# Patient Record
Sex: Female | Born: 1965 | Race: White | Hispanic: No | Marital: Single | Smoking: Never smoker
Health system: Southern US, Community
[De-identification: ages and names within clinical notes are randomized; demographics above are authoritative.]

## PROBLEM LIST (undated history)

## (undated) DIAGNOSIS — I8290 Acute embolism and thrombosis of unspecified vein: Secondary | ICD-10-CM

## (undated) HISTORY — PX: ABDOMINAL HYSTERECTOMY: SHX81

---

## 1997-09-17 ENCOUNTER — Encounter: Admission: RE | Admit: 1997-09-17 | Discharge: 1997-09-17 | Payer: Self-pay | Admitting: *Deleted

## 1997-11-18 ENCOUNTER — Ambulatory Visit (HOSPITAL_COMMUNITY): Admission: RE | Admit: 1997-11-18 | Discharge: 1997-11-18 | Payer: Self-pay | Admitting: Obstetrics and Gynecology

## 1997-11-18 ENCOUNTER — Encounter: Payer: Self-pay | Admitting: Obstetrics and Gynecology

## 1997-12-09 ENCOUNTER — Emergency Department (HOSPITAL_COMMUNITY): Admission: EM | Admit: 1997-12-09 | Discharge: 1997-12-09 | Payer: Self-pay | Admitting: Emergency Medicine

## 1998-01-24 ENCOUNTER — Emergency Department (HOSPITAL_COMMUNITY): Admission: EM | Admit: 1998-01-24 | Discharge: 1998-01-24 | Payer: Self-pay | Admitting: Emergency Medicine

## 1998-01-24 ENCOUNTER — Encounter: Payer: Self-pay | Admitting: Emergency Medicine

## 1998-01-28 ENCOUNTER — Ambulatory Visit (HOSPITAL_COMMUNITY): Admission: RE | Admit: 1998-01-28 | Discharge: 1998-01-28 | Payer: Self-pay | Admitting: Family Medicine

## 1998-11-06 ENCOUNTER — Emergency Department (HOSPITAL_COMMUNITY): Admission: EM | Admit: 1998-11-06 | Discharge: 1998-11-06 | Payer: Self-pay | Admitting: Emergency Medicine

## 1998-11-06 ENCOUNTER — Encounter: Payer: Self-pay | Admitting: Emergency Medicine

## 2000-06-23 ENCOUNTER — Ambulatory Visit (HOSPITAL_COMMUNITY): Admission: RE | Admit: 2000-06-23 | Discharge: 2000-06-23 | Payer: Self-pay | Admitting: Family Medicine

## 2000-06-23 ENCOUNTER — Encounter: Admission: RE | Admit: 2000-06-23 | Discharge: 2000-06-23 | Payer: Self-pay | Admitting: Family Medicine

## 2000-07-07 ENCOUNTER — Encounter: Admission: RE | Admit: 2000-07-07 | Discharge: 2000-07-07 | Payer: Self-pay | Admitting: Family Medicine

## 2000-09-12 ENCOUNTER — Ambulatory Visit (HOSPITAL_COMMUNITY): Admission: RE | Admit: 2000-09-12 | Discharge: 2000-09-12 | Payer: Self-pay | Admitting: Family Medicine

## 2000-09-12 ENCOUNTER — Encounter: Payer: Self-pay | Admitting: Family Medicine

## 2001-06-10 ENCOUNTER — Encounter: Payer: Self-pay | Admitting: Emergency Medicine

## 2001-06-10 ENCOUNTER — Emergency Department (HOSPITAL_COMMUNITY): Admission: EM | Admit: 2001-06-10 | Discharge: 2001-06-10 | Payer: Self-pay | Admitting: Emergency Medicine

## 2002-10-01 ENCOUNTER — Encounter: Admission: RE | Admit: 2002-10-01 | Discharge: 2002-10-01 | Payer: Self-pay | Admitting: Internal Medicine

## 2002-10-10 ENCOUNTER — Encounter: Admission: RE | Admit: 2002-10-10 | Discharge: 2002-10-10 | Payer: Self-pay | Admitting: Internal Medicine

## 2002-10-30 ENCOUNTER — Encounter: Admission: RE | Admit: 2002-10-30 | Discharge: 2002-10-30 | Payer: Self-pay | Admitting: Internal Medicine

## 2002-10-30 ENCOUNTER — Encounter: Payer: Self-pay | Admitting: Internal Medicine

## 2002-10-30 ENCOUNTER — Ambulatory Visit (HOSPITAL_COMMUNITY): Admission: RE | Admit: 2002-10-30 | Discharge: 2002-10-30 | Payer: Self-pay | Admitting: Internal Medicine

## 2002-12-05 ENCOUNTER — Emergency Department (HOSPITAL_COMMUNITY): Admission: EM | Admit: 2002-12-05 | Discharge: 2002-12-06 | Payer: Self-pay | Admitting: *Deleted

## 2002-12-07 ENCOUNTER — Encounter: Admission: RE | Admit: 2002-12-07 | Discharge: 2002-12-07 | Payer: Self-pay | Admitting: Internal Medicine

## 2002-12-19 ENCOUNTER — Encounter: Admission: RE | Admit: 2002-12-19 | Discharge: 2002-12-19 | Payer: Self-pay | Admitting: Internal Medicine

## 2003-01-26 ENCOUNTER — Emergency Department (HOSPITAL_COMMUNITY): Admission: EM | Admit: 2003-01-26 | Discharge: 2003-01-27 | Payer: Self-pay | Admitting: Emergency Medicine

## 2003-02-21 ENCOUNTER — Encounter: Admission: RE | Admit: 2003-02-21 | Discharge: 2003-02-21 | Payer: Self-pay | Admitting: Internal Medicine

## 2003-08-21 ENCOUNTER — Emergency Department (HOSPITAL_COMMUNITY): Admission: EM | Admit: 2003-08-21 | Discharge: 2003-08-21 | Payer: Self-pay | Admitting: Family Medicine

## 2010-09-24 ENCOUNTER — Emergency Department (HOSPITAL_COMMUNITY)
Admission: EM | Admit: 2010-09-24 | Discharge: 2010-09-24 | Disposition: A | Discharge status: Home / Self Care | Payer: No Typology Code available for payment source | Attending: Emergency Medicine | Admitting: Emergency Medicine

## 2010-09-24 ENCOUNTER — Emergency Department (HOSPITAL_COMMUNITY): Payer: No Typology Code available for payment source

## 2010-09-24 DIAGNOSIS — Y92410 Unspecified street and highway as the place of occurrence of the external cause: Secondary | ICD-10-CM | POA: Insufficient documentation

## 2010-09-24 DIAGNOSIS — T07XXXA Unspecified multiple injuries, initial encounter: Secondary | ICD-10-CM | POA: Insufficient documentation

## 2010-09-24 DIAGNOSIS — M542 Cervicalgia: Secondary | ICD-10-CM | POA: Insufficient documentation

## 2010-09-24 DIAGNOSIS — M549 Dorsalgia, unspecified: Secondary | ICD-10-CM | POA: Insufficient documentation

## 2010-09-24 DIAGNOSIS — M79609 Pain in unspecified limb: Secondary | ICD-10-CM | POA: Insufficient documentation

## 2014-05-07 ENCOUNTER — Emergency Department (HOSPITAL_COMMUNITY)
Admission: EM | Admit: 2014-05-07 | Discharge: 2014-05-08 | Disposition: A | Discharge status: Home / Self Care | Payer: Medicare Other | Attending: Emergency Medicine | Admitting: Emergency Medicine

## 2014-05-07 ENCOUNTER — Encounter (HOSPITAL_COMMUNITY): Payer: Self-pay | Admitting: Emergency Medicine

## 2014-05-07 ENCOUNTER — Emergency Department (HOSPITAL_COMMUNITY): Payer: Medicare Other

## 2014-05-07 DIAGNOSIS — I951 Orthostatic hypotension: Secondary | ICD-10-CM | POA: Diagnosis not present

## 2014-05-07 DIAGNOSIS — Y998 Other external cause status: Secondary | ICD-10-CM | POA: Insufficient documentation

## 2014-05-07 DIAGNOSIS — S50811A Abrasion of right forearm, initial encounter: Secondary | ICD-10-CM | POA: Insufficient documentation

## 2014-05-07 DIAGNOSIS — Z86718 Personal history of other venous thrombosis and embolism: Secondary | ICD-10-CM | POA: Diagnosis not present

## 2014-05-07 DIAGNOSIS — Y9289 Other specified places as the place of occurrence of the external cause: Secondary | ICD-10-CM | POA: Diagnosis not present

## 2014-05-07 DIAGNOSIS — R002 Palpitations: Secondary | ICD-10-CM | POA: Diagnosis not present

## 2014-05-07 DIAGNOSIS — S6992XA Unspecified injury of left wrist, hand and finger(s), initial encounter: Secondary | ICD-10-CM | POA: Diagnosis not present

## 2014-05-07 DIAGNOSIS — W19XXXA Unspecified fall, initial encounter: Secondary | ICD-10-CM

## 2014-05-07 DIAGNOSIS — R42 Dizziness and giddiness: Secondary | ICD-10-CM | POA: Diagnosis not present

## 2014-05-07 DIAGNOSIS — Z7901 Long term (current) use of anticoagulants: Secondary | ICD-10-CM | POA: Diagnosis not present

## 2014-05-07 DIAGNOSIS — W1839XA Other fall on same level, initial encounter: Secondary | ICD-10-CM | POA: Diagnosis not present

## 2014-05-07 DIAGNOSIS — R000 Tachycardia, unspecified: Secondary | ICD-10-CM | POA: Diagnosis not present

## 2014-05-07 DIAGNOSIS — Y9389 Activity, other specified: Secondary | ICD-10-CM | POA: Diagnosis not present

## 2014-05-07 DIAGNOSIS — Z79899 Other long term (current) drug therapy: Secondary | ICD-10-CM | POA: Insufficient documentation

## 2014-05-07 DIAGNOSIS — S60222A Contusion of left hand, initial encounter: Secondary | ICD-10-CM | POA: Insufficient documentation

## 2014-05-07 HISTORY — DX: Acute embolism and thrombosis of unspecified vein: I82.90

## 2014-05-07 LAB — CBG MONITORING, ED: GLUCOSE-CAPILLARY: 122 mg/dL — AB (ref 70–99)

## 2014-05-07 LAB — URINALYSIS, ROUTINE W REFLEX MICROSCOPIC
Glucose, UA: NEGATIVE mg/dL
Hgb urine dipstick: NEGATIVE
KETONES UR: 15 mg/dL — AB
Leukocytes, UA: NEGATIVE
NITRITE: NEGATIVE
PROTEIN: NEGATIVE mg/dL
Specific Gravity, Urine: 1.025 (ref 1.005–1.030)
UROBILINOGEN UA: 1 mg/dL (ref 0.0–1.0)
pH: 5.5 (ref 5.0–8.0)

## 2014-05-07 LAB — CBC WITH DIFFERENTIAL/PLATELET
Basophils Absolute: 0 10*3/uL (ref 0.0–0.1)
Basophils Relative: 0 % (ref 0–1)
EOS ABS: 0.1 10*3/uL (ref 0.0–0.7)
EOS PCT: 1 % (ref 0–5)
HEMATOCRIT: 41.8 % (ref 36.0–46.0)
HEMOGLOBIN: 13.4 g/dL (ref 12.0–15.0)
LYMPHS ABS: 2.8 10*3/uL (ref 0.7–4.0)
LYMPHS PCT: 18 % (ref 12–46)
MCH: 29.8 pg (ref 26.0–34.0)
MCHC: 32.1 g/dL (ref 30.0–36.0)
MCV: 92.9 fL (ref 78.0–100.0)
MONO ABS: 1.4 10*3/uL — AB (ref 0.1–1.0)
MONOS PCT: 9 % (ref 3–12)
Neutro Abs: 11 10*3/uL — ABNORMAL HIGH (ref 1.7–7.7)
Neutrophils Relative %: 72 % (ref 43–77)
PLATELETS: 213 10*3/uL (ref 150–400)
RBC: 4.5 MIL/uL (ref 3.87–5.11)
RDW: 14.1 % (ref 11.5–15.5)
WBC: 15.3 10*3/uL — AB (ref 4.0–10.5)

## 2014-05-07 LAB — BASIC METABOLIC PANEL
Anion gap: 11 (ref 5–15)
BUN: 15 mg/dL (ref 6–23)
CO2: 27 mmol/L (ref 19–32)
Calcium: 8.7 mg/dL (ref 8.4–10.5)
Chloride: 97 mmol/L (ref 96–112)
Creatinine, Ser: 0.84 mg/dL (ref 0.50–1.10)
GFR calc Af Amer: >90 mL/min (ref >90)
GFR, EST NON AFRICAN AMERICAN: 82 mL/min — AB (ref >90)
GLUCOSE: 114 mg/dL — AB (ref 70–99)
POTASSIUM: 3.9 mmol/L (ref 3.5–5.1)
SODIUM: 135 mmol/L (ref 135–145)

## 2014-05-07 LAB — PROTIME-INR
INR: 1.73 — AB (ref 0.00–1.49)
Prothrombin Time: 20.4 seconds — ABNORMAL HIGH (ref 11.6–15.2)

## 2014-05-07 MED ORDER — SODIUM CHLORIDE 0.9 % IV BOLUS (SEPSIS): 500.0000 mL | Freq: Once | INTRAVENOUS | Status: DC

## 2014-05-07 MED ORDER — SODIUM CHLORIDE 0.9 % IV BOLUS (SEPSIS)
1000.0000 mL | Freq: Once | INTRAVENOUS | Status: AC
  Administered 2014-05-07: 1000 mL via INTRAVENOUS

## 2014-05-07 NOTE — ED Notes (Addendum)
Me and Janett Billow assisted pt to bedside commode pt unable to urinate at this time.

## 2014-05-07 NOTE — ED Provider Notes (Signed)
CSN: [id number]     Arrival date & time 05/07/14  1721 History   First MD Initiated Contact with Patient 05/07/14 [place]     Chief Complaint  Patient presents with  . Fall     (Consider location/radiation/quality/duration/timing/severity/associated sxs/prior Treatment) Patient is a 49 y.o. female presenting with fall. The history is provided by the patient. The history is limited by a developmental delay. No language interpreter was used.  Fall This is a new problem. The current episode started today. The problem has been gradually improving. Pertinent negatives include no abdominal pain, change in bowel habit, chest pain, congestion, coughing, diaphoresis, fatigue, fever, headaches, nausea, visual change, vomiting or weakness. Nothing aggravates the symptoms. She has tried nothing for the symptoms.    Past Medical History  Diagnosis Date  . Blood clot in vein    Past Surgical History  Procedure Laterality Date  . Abdominal hysterectomy     No family history on file. History  Substance Use Topics  . Smoking status: Never Smoker   . Smokeless tobacco: Not on file  . Alcohol Use: No   OB History    No data available     Review of Systems  Constitutional: Negative for fever, diaphoresis and fatigue.  HENT: Negative for congestion.   Eyes: Negative for visual disturbance.  Respiratory: Negative for cough, chest tightness and shortness of breath.   Cardiovascular: Positive for palpitations. Negative for chest pain.  Gastrointestinal: Negative for nausea, vomiting, abdominal pain and change in bowel habit.  Neurological: Positive for light-headedness. Negative for speech difficulty, weakness and headaches.  Psychiatric/Behavioral: Negative for confusion.  All other systems reviewed and are negative.     Allergies  Codeine  Home Medications   Prior to Admission medications   Medication Sig Start Date End Date Taking? Authorizing Provider  albuterol (PROVENTIL  HFA;VENTOLIN HFA) 108 (90 BASE) MCG/ACT inhaler Inhale 1 puff into the lungs every 6 (six) hours as needed for wheezing or shortness of breath.   Yes Historical Provider, MD  amitriptyline (ELAVIL) 25 MG tablet Take 25 mg by mouth at bedtime.   Yes Historical Provider, MD  cyclobenzaprine (FLEXERIL) 5 MG tablet Take 5 mg by mouth 3 (three) times daily as needed for muscle spasms.   Yes Historical Provider, MD  furosemide (LASIX) 40 MG tablet Take 40 mg by mouth daily.   Yes Historical Provider, MD  potassium chloride (K-DUR) 10 MEQ tablet Take 10 mEq by mouth 2 (two) times daily.   Yes Historical Provider, MD  warfarin (COUMADIN) 5 MG tablet Take 5 mg by mouth daily.   Yes Historical Provider, MD   BP 121/56 mmHg  Pulse 103  Temp(Src) 98.5 F (36.9 C) (Oral)  Resp 17  Ht 5\' 6"  (1.676 m)  Wt 400 lb (181.439 kg)  BMI 64.59 kg/m2  SpO2 100% Physical Exam  Constitutional: She is oriented to person, place, and time. She appears well-developed and well-nourished. No distress.  Morbidly obese  HENT:  Head: Normocephalic and atraumatic.  Nose: Nose normal.  Mouth/Throat: Oropharynx is clear and moist. No oropharyngeal exudate.  No signs of external trauma.  No midface instability or tenderness  Eyes: EOM are normal. Pupils are equal, round, and reactive to light.  Neck: Normal range of motion. Neck supple.  Cardiovascular: Regular rhythm, normal heart sounds and intact distal pulses.  Tachycardia present.   No murmur heard. Pulmonary/Chest: Effort normal. No respiratory distress. She has no wheezes. She exhibits no tenderness.  Decreased breath sounds  2/2 body habitus  Abdominal: Soft. There is no tenderness. There is no rebound and no guarding.  Musculoskeletal: Normal range of motion. She exhibits tenderness.  Slight tenderness to palpation of left wrist, with small bruise at base of palm.  Able to fully range left wrist and no tenderness at snuff box.  No pain on bony palpation of other  extremities.  Superficial abrasion on right forearm, nontender.  Chest and pelvis stable and nontender.    Lymphadenopathy:    She has no cervical adenopathy.  Neurological: She is alert and oriented to person, place, and time. No cranial nerve deficit. Coordination normal.  Skin: Skin is warm and dry. She is not diaphoretic.  Psychiatric: She has a normal mood and affect. Her behavior is normal. Judgment and thought content normal.  Nursing note and vitals reviewed.   ED Course  Procedures (including critical care time) Labs Review Labs Reviewed  CBC WITH DIFFERENTIAL/PLATELET - Abnormal; Notable for the following:    WBC 15.3 (*)    Neutro Abs 11.0 (*)    Monocytes Absolute 1.4 (*)    All other components within normal limits  PROTIME-INR - Abnormal; Notable for the following:    Prothrombin Time 20.4 (*)    INR 1.73 (*)    All other components within normal limits  BASIC METABOLIC PANEL - Abnormal; Notable for the following:    Glucose, Bld 114 (*)    GFR calc non Af Amer 82 (*)    All other components within normal limits  URINALYSIS, ROUTINE W REFLEX MICROSCOPIC - Abnormal; Notable for the following:    Color, Urine AMBER (*)    Bilirubin Urine SMALL (*)    Ketones, ur 15 (*)    All other components within normal limits  CBG MONITORING, ED - Abnormal; Notable for the following:    Glucose-Capillary 122 (*)    All other components within normal limits    Imaging Review Dg Wrist Complete Left  05/07/2014   CLINICAL DATA:  Fall onto hand. Pain at the metacarpal and lateral wrist.  EXAM: LEFT WRIST - COMPLETE 3+ VIEW  COMPARISON:  09/24/2010  FINDINGS: There is no evidence of fracture or dislocation.  No acute soft tissue findings.  Ulnar negative variance without visible complication.  IMPRESSION: Negative.   Electronically Signed   By: Monte Fantasia M.D.   On: 05/07/2014 23:53     EKG Interpretation   Date/Time:  Tuesday May 07 2014 17:37:47 EDT Ventricular Rate:   115 PR Interval:  135 QRS Duration: 95 QT Interval:  318 QTC Calculation: 440 R Axis:   19 Text Interpretation:  Sinus tachycardia Borderline T abnormalities,  anterior leads When compared with ECG of 06/23/2000, No significant change  was found Confirmed by [facility]  MD, DAVID ([id number]) on 05/07/2014 7:46:58 PM      MDM   Final diagnoses:  Orthostatic hypotension  Fall from standing, initial encounter   Pt is a morbidly obese 49 yo F with hx of HTN, CHF, DVT (on coumadin), and COPD who presents after a fall.  She reports she stood from a chair today and felt lightheaded, then lost her balance and fell forward onto the ground. She landed mostly on her stomach/left hand, but also reports "bouncing" her forehead on the ground.  She was wearing glasses at the time and didn't cause any damage to the glasses or any abrasions to her head with the fall.  She has a small bruise to her left hand.  Patient is morbidly obese and couldn't get her feet back underneath her to stand up, so had to crawl back to get her phone to call for help.  She has some superficial abrasions to her right elbow due to crawling on the carpet.  First responders went to her house and were able to get her up and she was ambulatory.  Due to continued mild lightheadedness, she was brought into the ED.   On arrival, patient was tachycardic but otherwise normal vitals.   EKG with sinus tachy at at rate of 115. Similar to previous.   Worked up with labs and orthostatic vitals.  Became tachycardic with standing but did not drop blood pressure.  As she has a component of orthosatic changes, will give her a bolus.  Labs returned benign.  INR 1.73.   Patient continued to complain of left wrist pain.  Benign exam, but will shoot an xray to evaluate further.   Xray returned benign.  No signs of fracture or malalignment.   Patient was considered stable for discharge home.  Advised good PO fluid intake.  Advised tylenol or motrin for left hand  pain.  Discussed RICE therapy.  Encouraged her to change positions slowly and to stay well hydrated.  Advised close PCP follow up after dc and discussed ED return precautions.   Patient was seen with ED Attending, Dr. Maryjean Ka, MD   Tori Milks, MD 79/15/05 [id number]  Delora Fuel, MD 48/01/65 [id number]

## 2014-05-07 NOTE — ED Notes (Signed)
Pt states she went to get up from her recliner and upon standing pt got very dizzy and fell forward onto face. Pt c/o pain in left wrist and and right elbow. Pt also reports pain in lower back, but has had this pain for 1 month now. Pt is alert and ox3. Denies any dizziness at this time or change in vision.

## 2014-05-07 NOTE — ED Notes (Signed)
MD at bedside. 

## 2014-05-07 NOTE — ED Notes (Signed)
Pt placed on bed side commode unable to provide urine sample at this time.

## 2014-05-08 NOTE — ED Notes (Signed)
Instructed patient not to get up right away to sit on the side of the bed for a couple of minutes before standing up

## 2014-06-23 DIAGNOSIS — K8019 Calculus of gallbladder with other cholecystitis with obstruction: Secondary | ICD-10-CM | POA: Insufficient documentation

## 2014-06-23 HISTORY — DX: Calculus of gallbladder with other cholecystitis with obstruction: K80.19

## 2015-02-18 DIAGNOSIS — M549 Dorsalgia, unspecified: Secondary | ICD-10-CM

## 2015-02-18 DIAGNOSIS — G4733 Obstructive sleep apnea (adult) (pediatric): Secondary | ICD-10-CM

## 2015-02-18 DIAGNOSIS — J45909 Unspecified asthma, uncomplicated: Secondary | ICD-10-CM

## 2015-02-18 DIAGNOSIS — R42 Dizziness and giddiness: Secondary | ICD-10-CM

## 2015-02-18 DIAGNOSIS — R0602 Shortness of breath: Secondary | ICD-10-CM

## 2015-02-18 DIAGNOSIS — K219 Gastro-esophageal reflux disease without esophagitis: Secondary | ICD-10-CM | POA: Insufficient documentation

## 2015-02-18 DIAGNOSIS — D6859 Other primary thrombophilia: Secondary | ICD-10-CM

## 2015-02-18 DIAGNOSIS — N839 Noninflammatory disorder of ovary, fallopian tube and broad ligament, unspecified: Secondary | ICD-10-CM

## 2015-02-18 DIAGNOSIS — R791 Abnormal coagulation profile: Secondary | ICD-10-CM | POA: Insufficient documentation

## 2015-02-18 DIAGNOSIS — D179 Benign lipomatous neoplasm, unspecified: Secondary | ICD-10-CM | POA: Insufficient documentation

## 2015-02-18 DIAGNOSIS — R600 Localized edema: Secondary | ICD-10-CM | POA: Insufficient documentation

## 2015-02-18 DIAGNOSIS — G4700 Insomnia, unspecified: Secondary | ICD-10-CM

## 2015-02-18 DIAGNOSIS — R7301 Impaired fasting glucose: Secondary | ICD-10-CM

## 2015-02-18 DIAGNOSIS — F411 Generalized anxiety disorder: Secondary | ICD-10-CM | POA: Insufficient documentation

## 2015-02-18 DIAGNOSIS — M1990 Unspecified osteoarthritis, unspecified site: Secondary | ICD-10-CM | POA: Insufficient documentation

## 2015-02-18 HISTORY — DX: Morbid (severe) obesity due to excess calories: E66.01

## 2015-02-18 HISTORY — DX: Other primary thrombophilia: D68.59

## 2015-02-18 HISTORY — DX: Dorsalgia, unspecified: M54.9

## 2015-02-18 HISTORY — DX: Unspecified asthma, uncomplicated: J45.909

## 2015-02-18 HISTORY — DX: Insomnia, unspecified: G47.00

## 2015-02-18 HISTORY — DX: Obstructive sleep apnea (adult) (pediatric): G47.33

## 2015-02-18 HISTORY — DX: Abnormal coagulation profile: R79.1

## 2015-02-18 HISTORY — DX: Noninflammatory disorder of ovary, fallopian tube and broad ligament, unspecified: N83.9

## 2015-02-18 HISTORY — DX: Unspecified osteoarthritis, unspecified site: M19.90

## 2015-02-18 HISTORY — DX: Gastro-esophageal reflux disease without esophagitis: K21.9

## 2015-02-18 HISTORY — DX: Dizziness and giddiness: R42

## 2015-02-18 HISTORY — DX: Localized edema: R60.0

## 2015-02-18 HISTORY — DX: Impaired fasting glucose: R73.01

## 2015-02-18 HISTORY — DX: Shortness of breath: R06.02

## 2015-02-18 HISTORY — DX: Benign lipomatous neoplasm, unspecified: D17.9

## 2015-02-18 HISTORY — DX: Generalized anxiety disorder: F41.1

## 2015-03-20 DIAGNOSIS — Z7901 Long term (current) use of anticoagulants: Secondary | ICD-10-CM

## 2015-03-20 DIAGNOSIS — R030 Elevated blood-pressure reading, without diagnosis of hypertension: Secondary | ICD-10-CM

## 2015-03-20 HISTORY — DX: Elevated blood-pressure reading, without diagnosis of hypertension: R03.0

## 2015-03-20 HISTORY — DX: Long term (current) use of anticoagulants: Z79.01

## 2015-04-22 DIAGNOSIS — M1712 Unilateral primary osteoarthritis, left knee: Secondary | ICD-10-CM

## 2015-04-22 HISTORY — DX: Unilateral primary osteoarthritis, left knee: M17.12

## 2015-05-16 DIAGNOSIS — E876 Hypokalemia: Secondary | ICD-10-CM

## 2015-05-16 HISTORY — DX: Hypokalemia: E87.6

## 2015-06-25 DIAGNOSIS — G43009 Migraine without aura, not intractable, without status migrainosus: Secondary | ICD-10-CM | POA: Insufficient documentation

## 2015-06-25 HISTORY — DX: Migraine without aura, not intractable, without status migrainosus: G43.009

## 2015-07-01 DIAGNOSIS — R000 Tachycardia, unspecified: Secondary | ICD-10-CM

## 2015-07-01 HISTORY — DX: Tachycardia, unspecified: R00.0

## 2015-07-25 DIAGNOSIS — Z79899 Other long term (current) drug therapy: Secondary | ICD-10-CM

## 2015-07-25 HISTORY — DX: Other long term (current) drug therapy: Z79.899

## 2015-10-27 DIAGNOSIS — Z1231 Encounter for screening mammogram for malignant neoplasm of breast: Secondary | ICD-10-CM

## 2015-10-27 DIAGNOSIS — I82509 Chronic embolism and thrombosis of unspecified deep veins of unspecified lower extremity: Secondary | ICD-10-CM

## 2015-10-27 DIAGNOSIS — D6851 Activated protein C resistance: Secondary | ICD-10-CM

## 2015-10-27 HISTORY — DX: Chronic embolism and thrombosis of unspecified deep veins of unspecified lower extremity: I82.509

## 2015-10-27 HISTORY — DX: Activated protein C resistance: D68.51

## 2015-10-27 HISTORY — DX: Encounter for screening mammogram for malignant neoplasm of breast: Z12.31

## 2016-12-20 DIAGNOSIS — M1711 Unilateral primary osteoarthritis, right knee: Secondary | ICD-10-CM | POA: Insufficient documentation

## 2016-12-20 HISTORY — DX: Unilateral primary osteoarthritis, right knee: M17.11

## 2017-08-30 DIAGNOSIS — M170 Bilateral primary osteoarthritis of knee: Secondary | ICD-10-CM

## 2017-08-30 HISTORY — DX: Bilateral primary osteoarthritis of knee: M17.0

## 2018-03-15 DIAGNOSIS — Z86718 Personal history of other venous thrombosis and embolism: Secondary | ICD-10-CM

## 2018-03-15 HISTORY — DX: Personal history of other venous thrombosis and embolism: Z86.718

## 2018-08-02 DIAGNOSIS — F331 Major depressive disorder, recurrent, moderate: Secondary | ICD-10-CM | POA: Insufficient documentation

## 2018-08-02 HISTORY — DX: Major depressive disorder, recurrent, moderate: F33.1

## 2021-05-01 ENCOUNTER — Telehealth: Payer: Self-pay

## 2021-05-01 MED ORDER — WARFARIN SODIUM 5 MG PO TABS: 5.0000 mg | ORAL_TABLET | ORAL | 0 refills | Status: DC

## 2021-05-01 NOTE — Addendum Note (Signed)
Addended by: Leonidas Romberg on: 05/01/2021 02:45 PM ? ? Modules accepted: Orders ? ?

## 2021-05-01 NOTE — Telephone Encounter (Signed)
Called to remind patient of upcoming New Coumadin appt this coming Tuesday, 05/05/21 as well as her New Patient visit with Dr Agustin Cree on 05/08/21. Pt stated she is not able to drive and her transportation resources are very limited. I mentioned other transportation options; however pt stated it was just to difficult for her to go to multiple appts in one week. I called front desk at [facility] office in [place] and tried to schedule both New Coumadin appt and Dr Agustin Cree appt on the same day but Coumadin Clinic is only open on Tuesdays and Dr Agustin Cree does not have any available appts.  ?Pt states she is working with [facility] to get [place] as well as a hospital bed and other equipment.  ? ?I called [facility] and spoke with Manus Gunning. [facility] is attempting to set up serviced for Beth Marshall and Beth Marshall is who is following the patient. Transferred to Tammy but no answer. Provided Manus Gunning with [facility] number. Will continue to follow and wait for call back.  ?

## 2021-05-06 ENCOUNTER — Ambulatory Visit (INDEPENDENT_AMBULATORY_CARE_PROVIDER_SITE_OTHER): Payer: Medicare Other

## 2021-05-06 ENCOUNTER — Telehealth: Payer: Self-pay

## 2021-05-06 DIAGNOSIS — I4891 Unspecified atrial fibrillation: Secondary | ICD-10-CM | POA: Diagnosis not present

## 2021-05-06 DIAGNOSIS — Z7901 Long term (current) use of anticoagulants: Secondary | ICD-10-CM | POA: Diagnosis not present

## 2021-05-06 LAB — PROTIME-INR: INR: 2.7 — AB (ref <=1.20)

## 2021-05-06 NOTE — Patient Instructions (Signed)
Description   ?Called and spoke with pt. Instructed to continue taking 1.5 tablets (7.'5mg'$ ) daily.  ?Called [facility] and spoke with Lenna Sciara, RN. Gave verbal orders for INR to be checked on 05/19/21 at next Home Health Visit.  ?Stay consistent with greens each week. ?[facility]in [facility] [phone] ?  ?   ?

## 2021-05-06 NOTE — Telephone Encounter (Signed)
This encounter was created in error - please disregard.

## 2021-05-06 NOTE — Telephone Encounter (Signed)
Received called from Manuela Schwartz with [facility] stating pt will have [place] services starting today; however, a nurse will only be going out to pt's home approximately once a month. I explained that the Cou[facility]in Clinic requires pt's INR to be checked once a week to ensure correct dose and proper management of Warfarin. Manuela Schwartz stated PT/INR can be drawn today at home visit and Kisha, Daisy can also discuss transportation options to assist pt to Coumadin Clinic in [place] when [place] is not scheduled to come. Provided Manuela Schwartz with Coumadin Clinic's direct number [phone]) and Fax number [phone]). Manuela Schwartz stated Alda Berthold, LPN will be going to pt's home later today and will call [facility]in Clinic with an update.  ?

## 2021-05-07 ENCOUNTER — Telehealth: Payer: Self-pay

## 2021-05-07 NOTE — Telephone Encounter (Signed)
Received call from [facility] Social Worker, Claiborne Billings while she was on a home visit with pt. Confirmed pt has transportation to appt with Dr Agustin Cree  tomorrow and also confirmed INR will be checked again at Home visit on 05/19/21.  ? ?Social Worker, [place]: [phone] ?[facility], Hospice: [phone] ? ? ? ? ? ? ?

## 2021-05-08 ENCOUNTER — Ambulatory Visit: Payer: Medicare Other | Admitting: Cardiology

## 2021-05-11 ENCOUNTER — Telehealth: Payer: Self-pay

## 2021-05-11 ENCOUNTER — Telehealth: Payer: Self-pay | Admitting: Cardiology

## 2021-05-11 DIAGNOSIS — I4891 Unspecified atrial fibrillation: Secondary | ICD-10-CM

## 2021-05-11 NOTE — Telephone Encounter (Signed)
Pt would like a callback regarding medication. Pt states that she only has 1 week left of Coumadin. Please advise ?

## 2021-05-11 NOTE — Telephone Encounter (Signed)
?*  STAT* If patient is at the pharmacy, call can be transferred to refill team. ? ? ?1. Which medications need to be refilled? (please list name of each medication and dose if known)  ? warfarin (COUMADIN) 5 MG tablet  ? ? ?2. Which pharmacy/location (including street and city if local pharmacy) is medication to be sent to?  ?[place], [place] ?3. Do they need a 30 day or 90 day supply? 90 day ? ?

## 2021-05-12 MED ORDER — WARFARIN SODIUM 5 MG PO TABS: ORAL_TABLET | ORAL | 0 refills | Status: DC

## 2021-05-12 NOTE — Telephone Encounter (Signed)
Prescription refill request received for warfarin ?Lov: OVERDUE - Dr Agustin Cree  ?Next INR check: 05/19/21 ?Warfarin tablet strength: '5mg'$  ? ?Called pt and made her aware that she will need to schedule appt with cardiologist for further refills and for Cou[facility]in Clinic to continue to follow. Pt verbalized understanding and was transferred to scheduling to schedule an appt. Appropriate dose and refill sent to requested pharmacy.  ?

## 2021-05-12 NOTE — Telephone Encounter (Signed)
Refill sent to requested pharmacy.

## 2021-05-19 ENCOUNTER — Telehealth: Payer: Self-pay

## 2021-05-19 LAB — PROTIME-INR: INR: 2 — AB (ref 0.80–1.20)

## 2021-05-19 NOTE — Telephone Encounter (Signed)
Called [facility] for INR results from [place] visit today. Melissa, RN stated specimen was sent to lab and should be resulted by tomorrow. Will follow-up and provide dosing instructions when labs are resulted.  ?

## 2021-05-20 ENCOUNTER — Ambulatory Visit (INDEPENDENT_AMBULATORY_CARE_PROVIDER_SITE_OTHER): Payer: Medicare Other

## 2021-05-20 ENCOUNTER — Telehealth: Payer: Self-pay

## 2021-05-20 DIAGNOSIS — I4891 Unspecified atrial fibrillation: Secondary | ICD-10-CM | POA: Diagnosis not present

## 2021-05-20 DIAGNOSIS — Z7901 Long term (current) use of anticoagulants: Secondary | ICD-10-CM | POA: Diagnosis not present

## 2021-05-20 NOTE — Telephone Encounter (Signed)
Beth Marshall, from [facility] called about being able to check her INR.  She said they would be able to check it every 3 weeks, or patient can check it herself if we order a machine for her, or we can change her medication.  ? ?Beth Marshall can be reached at [phone]. ?

## 2021-05-20 NOTE — Patient Instructions (Signed)
Description   ?Called and spoke with pt. Instructed to take 2 tablets today and then continue taking 1.5 tablets (7.'5mg'$ ) daily.  ?Called [facility] and spoke with Lenna Sciara, RN. Gave verbal orders for INR to be checked on 06/02/21 at next Home Health Visit.  ?Stay consistent with greens each week. ?[facility] Clinic [phone] ?  ?   ?

## 2021-05-20 NOTE — Telephone Encounter (Signed)
Called and spoke with Sam at [facility]. Pt is scheduled to have next Home Health visit on 06/03/21 and will have PT/INR drawn then.  ? ?Also made Sam aware that pt must go to upcoming appt with Dr Agustin Cree on 07/02/21 at 10:40. Confirmed [facility] has set up Transportation  for this appt.  ? ?Sam explained that [facility] typically does Palliative Home visits every 3-4 weeks. Since pt is on Warfarin, INR should be checked more frequently than that at times. Pt is unable to drive and transportation is an issue.  ?I placed a note on scheduled appt with Dr Joycelyn Rua for other anticoagulation options to be considered since INR monitoring, transportation, and compliance are a concern.  ? ?Sam verbalized understanding for INR to be drawn in 2 weeks (06/03/21) and the importance of upcoming appt with Dr Agustin Cree.  ?

## 2021-05-26 ENCOUNTER — Ambulatory Visit: Payer: Medicare Other | Admitting: Cardiology

## 2021-06-04 ENCOUNTER — Telehealth: Payer: Self-pay | Admitting: *Deleted

## 2021-06-04 DIAGNOSIS — I4891 Unspecified atrial fibrillation: Secondary | ICD-10-CM

## 2021-06-04 MED ORDER — WARFARIN SODIUM 5 MG PO TABS: ORAL_TABLET | ORAL | 0 refills | Status: DC

## 2021-06-04 NOTE — Telephone Encounter (Signed)
Received a voicemail from [place] with [facility] stating the pt was in need of a refill and that they would be checking INR tomorrow. Pt was due to have an INR checked on Wednesday by them therefore, called her back and had to leave a message regarding this. She didn't state the pharmacy and tried to call her without success and had to leave voicemail. Sent to local pharmacy and will await INR to be done tomorrow.

## 2021-06-05 ENCOUNTER — Telehealth: Payer: Self-pay | Admitting: Cardiology

## 2021-06-05 ENCOUNTER — Ambulatory Visit (INDEPENDENT_AMBULATORY_CARE_PROVIDER_SITE_OTHER): Payer: Medicare Other

## 2021-06-05 DIAGNOSIS — I4891 Unspecified atrial fibrillation: Secondary | ICD-10-CM | POA: Diagnosis not present

## 2021-06-05 DIAGNOSIS — Z7901 Long term (current) use of anticoagulants: Secondary | ICD-10-CM

## 2021-06-05 LAB — POCT INR: INR: 2.7 (ref 2.0–3.0)

## 2021-06-05 NOTE — Telephone Encounter (Signed)
Tammy wanted to make Dr. Raliegh Ip aware that pt was seen in there office today for an INR check. Pt's INR was 2.7. Please advise

## 2021-06-05 NOTE — Telephone Encounter (Signed)
Please refer to anticoagulation encounter.

## 2021-06-05 NOTE — Patient Instructions (Signed)
Description   - Called and spoke with Tammy, [facility] RN.  - Called and spoke with Beth Marshall.  Instructed to continue taking 1.5 tablets (7.'5mg'$ ) daily.  Gave verbal orders for INR to be checked in 2 weeks at next Home Health Visit on 06/22/21 Coumadin Clinic [phone]

## 2021-06-18 ENCOUNTER — Emergency Department (HOSPITAL_COMMUNITY): Payer: Medicare Other

## 2021-06-18 ENCOUNTER — Inpatient Hospital Stay (HOSPITAL_COMMUNITY)
Admission: EM | Admit: 2021-06-18 | Discharge: 2021-06-26 | DRG: 493 | Disposition: A | Discharge status: Skilled Nursing Facility | Payer: Medicare Other | Attending: Internal Medicine | Admitting: Internal Medicine

## 2021-06-18 ENCOUNTER — Ambulatory Visit: Payer: Medicare Other | Admitting: Cardiology

## 2021-06-18 DIAGNOSIS — G4733 Obstructive sleep apnea (adult) (pediatric): Secondary | ICD-10-CM | POA: Diagnosis present

## 2021-06-18 DIAGNOSIS — S301XXA Contusion of abdominal wall, initial encounter: Secondary | ICD-10-CM | POA: Diagnosis present

## 2021-06-18 DIAGNOSIS — D6851 Activated protein C resistance: Secondary | ICD-10-CM | POA: Diagnosis present

## 2021-06-18 DIAGNOSIS — R296 Repeated falls: Secondary | ICD-10-CM | POA: Diagnosis present

## 2021-06-18 DIAGNOSIS — S92251A Displaced fracture of navicular [scaphoid] of right foot, initial encounter for closed fracture: Secondary | ICD-10-CM | POA: Diagnosis present

## 2021-06-18 DIAGNOSIS — D62 Acute posthemorrhagic anemia: Secondary | ICD-10-CM | POA: Diagnosis not present

## 2021-06-18 DIAGNOSIS — F331 Major depressive disorder, recurrent, moderate: Secondary | ICD-10-CM | POA: Diagnosis present

## 2021-06-18 DIAGNOSIS — F411 Generalized anxiety disorder: Secondary | ICD-10-CM | POA: Diagnosis present

## 2021-06-18 DIAGNOSIS — G43009 Migraine without aura, not intractable, without status migrainosus: Secondary | ICD-10-CM | POA: Diagnosis present

## 2021-06-18 DIAGNOSIS — M898X9 Other specified disorders of bone, unspecified site: Secondary | ICD-10-CM | POA: Diagnosis present

## 2021-06-18 DIAGNOSIS — Z86718 Personal history of other venous thrombosis and embolism: Secondary | ICD-10-CM

## 2021-06-18 DIAGNOSIS — J45909 Unspecified asthma, uncomplicated: Secondary | ICD-10-CM | POA: Diagnosis present

## 2021-06-18 DIAGNOSIS — Z79899 Other long term (current) drug therapy: Secondary | ICD-10-CM

## 2021-06-18 DIAGNOSIS — D6859 Other primary thrombophilia: Secondary | ICD-10-CM | POA: Diagnosis present

## 2021-06-18 DIAGNOSIS — I480 Paroxysmal atrial fibrillation: Secondary | ICD-10-CM | POA: Diagnosis present

## 2021-06-18 DIAGNOSIS — Y92009 Unspecified place in unspecified non-institutional (private) residence as the place of occurrence of the external cause: Secondary | ICD-10-CM

## 2021-06-18 DIAGNOSIS — G8929 Other chronic pain: Secondary | ICD-10-CM | POA: Diagnosis present

## 2021-06-18 DIAGNOSIS — E559 Vitamin D deficiency, unspecified: Secondary | ICD-10-CM | POA: Diagnosis present

## 2021-06-18 DIAGNOSIS — S82402A Unspecified fracture of shaft of left fibula, initial encounter for closed fracture: Secondary | ICD-10-CM | POA: Diagnosis present

## 2021-06-18 DIAGNOSIS — S92254A Nondisplaced fracture of navicular [scaphoid] of right foot, initial encounter for closed fracture: Secondary | ICD-10-CM

## 2021-06-18 DIAGNOSIS — S8290XA Unspecified fracture of unspecified lower leg, initial encounter for closed fracture: Secondary | ICD-10-CM | POA: Diagnosis present

## 2021-06-18 DIAGNOSIS — Z7901 Long term (current) use of anticoagulants: Secondary | ICD-10-CM

## 2021-06-18 DIAGNOSIS — Z7951 Long term (current) use of inhaled steroids: Secondary | ICD-10-CM

## 2021-06-18 DIAGNOSIS — S82899A Other fracture of unspecified lower leg, initial encounter for closed fracture: Secondary | ICD-10-CM | POA: Diagnosis present

## 2021-06-18 DIAGNOSIS — S82192A Other fracture of upper end of left tibia, initial encounter for closed fracture: Secondary | ICD-10-CM | POA: Diagnosis not present

## 2021-06-18 DIAGNOSIS — S82832A Other fracture of upper and lower end of left fibula, initial encounter for closed fracture: Secondary | ICD-10-CM | POA: Diagnosis present

## 2021-06-18 DIAGNOSIS — K219 Gastro-esophageal reflux disease without esophagitis: Secondary | ICD-10-CM | POA: Diagnosis present

## 2021-06-18 DIAGNOSIS — S82202A Unspecified fracture of shaft of left tibia, initial encounter for closed fracture: Secondary | ICD-10-CM | POA: Diagnosis present

## 2021-06-18 DIAGNOSIS — I4891 Unspecified atrial fibrillation: Secondary | ICD-10-CM | POA: Diagnosis present

## 2021-06-18 DIAGNOSIS — S93601A Unspecified sprain of right foot, initial encounter: Secondary | ICD-10-CM | POA: Diagnosis present

## 2021-06-18 DIAGNOSIS — W06XXXA Fall from bed, initial encounter: Secondary | ICD-10-CM | POA: Diagnosis present

## 2021-06-18 DIAGNOSIS — Z6841 Body Mass Index (BMI) 40.0 and over, adult: Secondary | ICD-10-CM

## 2021-06-18 DIAGNOSIS — S92144A Nondisplaced dome fracture of right talus, initial encounter for closed fracture: Secondary | ICD-10-CM

## 2021-06-18 LAB — CBC WITH DIFFERENTIAL/PLATELET
Abs Immature Granulocytes: 0.03 10*3/uL (ref 0.00–0.07)
Basophils Absolute: 0 10*3/uL (ref 0.0–0.1)
Basophils Relative: 0 %
Eosinophils Absolute: 0.1 10*3/uL (ref 0.0–0.5)
Eosinophils Relative: 1 %
HCT: 41.1 % (ref 36.0–46.0)
Hemoglobin: 12.8 g/dL (ref 12.0–15.0)
Immature Granulocytes: 0 %
Lymphocytes Relative: 21 %
Lymphs Abs: 2.1 10*3/uL (ref 0.7–4.0)
MCH: 30.6 pg (ref 26.0–34.0)
MCHC: 31.1 g/dL (ref 30.0–36.0)
MCV: 98.3 fL (ref 80.0–100.0)
Monocytes Absolute: 0.9 10*3/uL (ref 0.1–1.0)
Monocytes Relative: 9 %
Neutro Abs: 7 10*3/uL (ref 1.7–7.7)
Neutrophils Relative %: 69 %
Platelets: 246 10*3/uL (ref 150–400)
RBC: 4.18 MIL/uL (ref 3.87–5.11)
RDW: 13.9 % (ref 11.5–15.5)
WBC: 10.2 10*3/uL (ref 4.0–10.5)
nRBC: 0 % (ref 0.0–0.2)

## 2021-06-18 LAB — COMPREHENSIVE METABOLIC PANEL
ALT: 20 U/L (ref 0–44)
AST: 33 U/L (ref 15–41)
Albumin: 3.7 g/dL (ref 3.5–5.0)
Alkaline Phosphatase: 85 U/L (ref 38–126)
Anion gap: 10 (ref 5–15)
BUN: 12 mg/dL (ref 6–20)
CO2: 23 mmol/L (ref 22–32)
Calcium: 9.2 mg/dL (ref 8.9–10.3)
Chloride: 103 mmol/L (ref 98–111)
Creatinine, Ser: 1.11 mg/dL — ABNORMAL HIGH (ref 0.44–1.00)
GFR, Estimated: 59 mL/min — ABNORMAL LOW (ref >60)
Glucose, Bld: 151 mg/dL — ABNORMAL HIGH (ref 70–99)
Potassium: 4.5 mmol/L (ref 3.5–5.1)
Sodium: 136 mmol/L (ref 135–145)
Total Bilirubin: 1.2 mg/dL (ref 0.3–1.2)
Total Protein: 6.7 g/dL (ref 6.5–8.1)

## 2021-06-18 LAB — PROTIME-INR
INR: 2.8 — ABNORMAL HIGH (ref 0.8–1.2)
Prothrombin Time: 29.5 seconds — ABNORMAL HIGH (ref 11.4–15.2)

## 2021-06-18 MED ORDER — HYDROMORPHONE HCL 1 MG/ML IJ SOLN
1.0000 mg | Freq: Once | INTRAMUSCULAR | Status: AC
  Administered 2021-06-18: 1 mg via INTRAVENOUS
  Filled 2021-06-18: qty 1

## 2021-06-18 NOTE — ED Provider Notes (Signed)
[facility] EMERGENCY DEPARTMENT Provider Note   CSN: [id number] Arrival date & time: 06/18/21  2150     History  Chief Complaint  Patient presents with   Leg Injury    Beth Marshall is a 56 y.o. female.  The history is provided by the patient and medical records.   56 y.o. F with morbid obesity, OA, factor V leiden on coumadin, protein S deficiency, migraine headaches, GERD, presenting to the ED following a fall.  Patient states over the past several days she has been to [facility] at least 3x after falls.  States her legs just cannot bear weight anymore.  She reports having x-ray and DVT studies which were negative.  She was sent home from ER today after no acute findings around 4PM.  States she went to the bathroom this evening and could not get up off the toilet.  Fire was called for lift assist only.  States when she tried to stand up her right foot "would not move" and all the weight went on her left leg and her ankle snapped.  Patient and EMS reports this was audible.  She reports significant pain in her left ankle/lower leg, some pain in right foot where it got "stuck underneath here".  There was no head injury or LOC.    Home Medications Prior to Admission medications   Medication Sig Start Date End Date Taking? Authorizing Provider  albuterol (PROVENTIL HFA;VENTOLIN HFA) 108 (90 BASE) MCG/ACT inhaler Inhale 1 puff into the lungs every 6 (six) hours as needed for wheezing or shortness of breath.    [provider]  augmented betamethasone dipropionate (DIPROLENE-AF) 0.05 % cream Apply 1 application. topically 2 (two) times daily. 07/14/17   [provider]  budesonide-formoterol (SYMBICORT) 160-4.5 MCG/ACT inhaler Inhale 2 puffs into the lungs daily.    [provider]  calcipotriene (DOVONOX) 0.005 % cream Apply 1 application. topically 2 (two) times daily. 08/17/18   [provider]  cetirizine (ZYRTEC) 10 MG tablet  Take 1 tablet by mouth daily. 01/16/19   [provider]  clotrimazole-betamethasone (LOTRISONE) cream Apply 1 application. topically 2 (two) times daily. 14 days 07/25/15   [provider]  cyclobenzaprine (FLEXERIL) 5 MG tablet Take 5 mg by mouth 3 (three) times daily as needed for muscle spasms.    [provider]  diclofenac Sodium (VOLTAREN) 1 % GEL Apply 2-3 g topically as needed for pain. 03/03/21   [provider]  furosemide (LASIX) 40 MG tablet Take 40 mg by mouth daily.    [provider]  montelukast (SINGULAIR) 10 MG tablet Take 1 tablet by mouth daily. 10/24/18   [provider]  mupirocin ointment (BACTROBAN) 2 % Apply 1 application. topically as needed. Apply to the affected area 08/19/15   [provider]  potassium chloride (K-DUR) 10 MEQ tablet Take 10 mEq by mouth 2 (two) times daily.    [provider]  SUMAtriptan (IMITREX) 100 MG tablet Take 1 tablet by mouth as needed for migraine. 07/18/15   [provider]  traMADol (ULTRAM) 50 MG tablet Take 1 tablet by mouth as needed for pain. 10/09/15   [provider]  venlafaxine XR (EFFEXOR-XR) 75 MG 24 hr capsule Take 1 capsule by mouth daily. 08/02/18   [provider]  warfarin (COUMADIN) 5 MG tablet Take 1 1/2 tablets daily or as directed by Coumadin Clinic. PLEASE KEEP UPCOMING CARDIOLOGY APPT FOR FUTURE REFILLS. 06/04/21   Agustin Cree,  Marily Lente, MD      Allergies    Cephalexin, Codeine, Diphenhydramine hcl, and Ranitidine    Review of Systems   Review of Systems  Musculoskeletal:  Positive for arthralgias.  All other systems reviewed and are negative.  Physical Exam Updated Vital Signs BP 105/68   Pulse 87   Temp 98.2 F (36.8 C) (Oral)   Resp 18   SpO2 98%   Physical Exam Vitals and nursing note reviewed.  Constitutional:      Appearance: She is morbidly obese.  HENT:     Head: Normocephalic and atraumatic.     Comments:  No visible head trauma Eyes:     Conjunctiva/sclera: Conjunctivae normal.     Pupils: Pupils are equal, round, and reactive to light.  Cardiovascular:     Rate and Rhythm: Normal rate and regular rhythm.     Heart sounds: Normal heart sounds.  Pulmonary:     Effort: Pulmonary effort is normal.     Breath sounds: Normal breath sounds.  Abdominal:     General: Bowel sounds are normal. There is no distension.     Palpations: Abdomen is soft.     Hernia: No hernia is present.  Musculoskeletal:        General: Normal range of motion.     Cervical back: Normal range of motion.     Comments: EMS splint in place-- fully cut down There is tenderness over proximal tib/fib and diffusely throughout left ankle; DP pulse intact; moving toes on command Right foot with tenderness over dorsal foot; there is no acute deformity, DP pulse remains intact, wiggling toes as normal  Skin:    General: Skin is warm and dry.  Neurological:     Mental Status: She is alert and oriented to person, place, and time.     Comments: AAOx3, answering questions and following commands without difficulty    ED Results / Procedures / Treatments   Labs (all labs ordered are listed, but only abnormal results are displayed) Labs Reviewed  COMPREHENSIVE METABOLIC PANEL - Abnormal; Notable for the following components:      Result Value   Glucose, Bld 151 (*)    Creatinine, Ser 1.11 (*)    GFR, Estimated 59 (*)    All other components within normal limits  PROTIME-INR - Abnormal; Notable for the following components:   Prothrombin Time 29.5 (*)    INR 2.8 (*)    All other components within normal limits  CBC WITH DIFFERENTIAL/PLATELET  PROTIME-INR    EKG None  Radiology DG Tibia/Fibula Left  Result Date: 06/18/2021 CLINICAL DATA:  Status post fall. EXAM: LEFT TIBIA AND FIBULA - 2 VIEW COMPARISON:  None Available. FINDINGS: Acute fracture deformities are seen involving the proximal shafts of the left tibia and  left fibula. Medial angulation of the distal fracture sites is seen. There is no evidence of dislocation. Chronic fracture deformities are noted along the visualized portion of the left medial malleolus and left lateral malleolus. Soft tissue swelling is seen at the level of the previously noted acute fractures. IMPRESSION: Acute fracture of the proximal shafts of the left tibia and left fibula. Electronically Signed   By: Virgina Norfolk M.D.   On: 06/18/2021 23:31   DG Ankle Complete Left  Addendum Date: 06/18/2021   ADDENDUM REPORT: 06/18/2021 23:38 ADDENDUM: A superficial soft tissue defect is seen along the anterior aspect of the left ankle at the level of the distal left tibial shaft. Electronically Signed  By: Virgina Norfolk M.D.   On: 06/18/2021 23:38   Result Date: 06/18/2021 CLINICAL DATA:  Status post fall. EXAM: LEFT ANKLE COMPLETE - 3+ VIEW COMPARISON:  None Available. FINDINGS: There is no evidence of an acute fracture, dislocation, or joint effusion. Chronic fracture deformities are seen involving the left lateral malleolus and left medial malleolus. Degenerative changes are seen along the dorsal aspect of the proximal and mid left foot. There is mild to moderate severity vascular calcification. Soft tissues are otherwise unremarkable. IMPRESSION: 1. No acute fracture or dislocation. 2. Chronic fracture deformities of the left lateral malleolus and left medial malleolus. Electronically Signed: By: Virgina Norfolk M.D. On: 06/18/2021 23:26   CT Knee Left Wo Contrast  Result Date: 06/19/2021 CLINICAL DATA:  Proximal tibial and fibular fractures on x-rays yesterday. CT requested for operative planning. EXAM: CT OF THE LEFT KNEE WITHOUT CONTRAST TECHNIQUE: Multidetector CT imaging of the left knee was performed according to the standard protocol. Multiplanar CT image reconstructions were also generated. RADIATION DOSE REDUCTION: This exam was performed according to the departmental  dose-optimization program which includes automated exposure control, adjustment of the mA and/or kV according to patient size and/or use of iterative reconstruction technique. COMPARISON:  Left knee plain films yesterday. FINDINGS: Bones/Joint/Cartilage Beam hardening artifact due to body habitus limits fine detail in the images. The bone mineralization is osteopenic. There are acute fractures of the proximal tibia and fibula. There is a transverse oblique proximal tibial shaft fracture beginning 6 cm below the tibial joint line with multiple comminution fragments along the anteromedial fracture margins and additional posterior comminution fragments. The comminution fragments demonstrate spreading around the fracture site, and the main distal tibial fragment is displaced laterally by up to 1/2 of a shaft width and anteriorly by about 1/3 of a shaft width with mild angulation towards the medial aspect. Just above this level is a comminuted transverse oblique fracture of the neck and proximal shaft of the fibula, with mild spreading of the comminution fragments, the distal fragment displaced laterally by an entire bone width and anteriorly by 1/2 of a shaft width, and the main distal fragment also mildly medially angulated. There is no tibial plateau fracture and no fracture line is seen extending into the proximal tibiofibular joint. There is advanced tricompartmental degenerative arthrosis of the knee with bulky reactive osteophytes and joint space loss greatest in the medial femorotibial joint where it is bone-on-bone. There is a small to moderate suprapatellar bursal effusion but it is low in density and not suspicious for hemarthrosis. There is a 1.5 cm osteochondral loose body in the anterior central femorotibial joint space and a posterolateral fabella. Ligaments Suboptimally assessed by CT. Muscles and Tendons The tendons not well seen due to beam hardening but grossly intact as far as visualized. The imaged  portion of the lower extremity demonstrates normal muscle bulk. There is no appreciable intramuscular hematoma in the proximal foreleg. Soft tissues There is moderate nonlocalizing patchy subcutaneous hemorrhage in the anterior proximal foreleg at the level of the fractures. There are calcifications in the popliteal artery. There are subcutaneous calcifications in the anterior proximal foreleg which are probably phleboliths. IMPRESSION: 1. Proximal tibial and fibular fractures as described above with comminution, and with medial angulation of the distal fragments. 2. Advanced tricompartmental degenerative arthrosis of the knee, with a 1.5 cm loose body in the anterior central joint space and a small to moderate low-density suprapatellar bursal effusion. 3. Moderate nonlocalizing subcutaneous soft tissue hemorrhage in the  anterior foreleg at the level of the fractures. No appreciable intramuscular hematoma. 4. Calcifications in the popliteal artery. Greater than usually expected at this age. Electronically Signed   By: Telford Nab M.D.   On: 06/19/2021 01:11   DG Knee Complete 4 Views Left  Result Date: 06/18/2021 CLINICAL DATA:  Post fall with deformity. EXAM: LEFT KNEE - COMPLETE 4+ VIEW COMPARISON:  None Available. FINDINGS: Comminuted proximal tibial shaft fracture. Tibial shaft is displaced laterally with respect to the proximal tibia. No obvious intra-articular involvement. Comminuted displaced proximal fibular fracture just proximal to the tibial fracture site. Moderate underlying knee osteoarthritis. Cannot assess for knee joint effusion on provided views. IMPRESSION: 1. Comminuted displaced proximal tibial shaft fracture without obvious intra-articular involvement. 2. Comminuted displaced proximal fibular fracture. Electronically Signed   By: Keith Rake M.D.   On: 06/18/2021 23:29   DG Ankle Right Port  Result Date: 06/19/2021 CLINICAL DATA:  Leg injury, pain. EXAM: PORTABLE RIGHT ANKLE - 2  VIEW COMPARISON:  Foot radiograph yesterday. FINDINGS: Bones are subjectively under mineralized. Small osseous density again seen adjacent to the dorsal navicular, age indeterminate avulsion fracture. No other fracture of the ankle. There is a well corticated density distal to the medial malleolus that is chronic. No mortise widening. Mild tibial talar degenerative change. Moderate plantar calcaneal spur. Age advanced arterial vascular calcifications. Mild generalized soft tissue edema. IMPRESSION: 1. Small osseous density adjacent to the dorsal navicular, age indeterminate avulsion fracture. No other acute fracture of the ankle. 2. Age advanced arterial vascular calcifications. Electronically Signed   By: Keith Rake M.D.   On: 06/19/2021 01:11   DG Foot Complete Left  Result Date: 06/18/2021 CLINICAL DATA:  Status post fall. EXAM: LEFT FOOT - COMPLETE 3+ VIEW COMPARISON:  None Available. FINDINGS: There is no evidence of an acute fracture or dislocation. Mild to moderate severity degenerative changes are seen along the dorsal aspect of the proximal to mid left foot. A superficial soft tissue defect is seen along the anterior aspect of the distal left tibial shaft. IMPRESSION: 1. No acute fracture or dislocation. 2. Mild to moderate severity degenerative changes. 3. Superficial soft tissue defect along the anterior aspect of the distal left tibial shaft. Electronically Signed   By: Virgina Norfolk M.D.   On: 06/18/2021 23:29   DG Foot Complete Right  Result Date: 06/18/2021 CLINICAL DATA:  Status post fall. EXAM: RIGHT FOOT COMPLETE - 3+ VIEW COMPARISON:  None Available. FINDINGS: Small fracture deformities of indeterminate age are seen along the dorsal aspects of the right talus and right navicular bone. There is no evidence of dislocation. A small to moderate sized plantar calcaneal spur is seen. Mild dorsal soft tissue swelling is noted. IMPRESSION: 1. Small fracture deformities of indeterminate age  along the dorsal aspects of the right talus and right navicular bone. Correlation with physical examination is recommended to determine the presence of point tenderness. 2. Small to moderate-sized plantar calcaneal spur. 3. Mild dorsal soft tissue swelling. Electronically Signed   By: Virgina Norfolk M.D.   On: 06/18/2021 23:35    Procedures Procedures    Medications Ordered in ED Medications  HYDROmorphone (DILAUDID) injection 1 mg (1 mg Intravenous Given 06/18/21 2226)  HYDROmorphone (DILAUDID) injection 1 mg (1 mg Intravenous Given 06/18/21 2339)    ED Course/ Medical Decision Making/ A&P  Medical Decision Making Amount and/or Complexity of Data Reviewed Labs: ordered. Radiology: ordered and independent interpretation performed. ECG/medicine tests: ordered and independent interpretation performed.  Risk Prescription drug management. Decision regarding hospitalization.   56 y.o. F here after a fall from commode.  Has been seen at [facility] several times this week after falls.  After speaking with her, it sounds like her legs are giving way due to her weight.  Her imaging thus far this week has been negative, also had a DVT study earlier today that was negative.  Could not get off the toilet today and called fire for assist, when she stood up heard a audible "crack" upon standing.  Right ankle did been to beneath her.  Has significant pain to left lower leg and right foot.  EMS splint in place on exam.  She maintains distal pulses to bilateral lower extremities.  She denies any injuries.  She did not have any head trauma and is neurologically intact on exam.  Will obtain x-rays.  X-rays reviewed--comminuted left proximal tibia and fibula fractures along with avulsion fractures of right talus and navicular.  Left medial and lateral malleolus fractures appear chronic, patient confirms this happened many years ago.  Imaging was addended shortly after with  noted soft tissue defect left lower leg over tibia shaft.  Splint was fully taken down and entire lower leg examined-- there is no open wound or areas of tissue compromise visualized.  Will discuss with orthopedics.  11:59 PM Spoke with on call orthopedics, Dr. Stann Mainland-- NPO after midnight, hold coumadin and start SQ heparin in AM.  CT left knee.  Will evaluate in the morning for operative planning.  Will need medical admission.  Discussed with hospitalist, Dr. Flossie Buffy-- will admit for ongoing care.  Splints ordered for both LE for overnight stabilization until full orthopedic evaluation in the AM.  Patient also transitioned to bariatic bed.  Final Clinical Impression(s) / ED Diagnoses Final diagnoses:  Closed fracture of proximal end of left tibia and fibula, initial encounter  Closed nondisplaced fracture of navicular bone of right foot, initial encounter  Closed nondisplaced fracture of dome of right talus, initial encounter    Rx / DC Orders ED Discharge Orders     None         Larene Pickett, PA-C 06/19/21 0135    Drenda Freeze, MD 06/24/21 1447

## 2021-06-18 NOTE — ED Triage Notes (Signed)
[facility]. EMS called for lift assist from toilet. Pt assisted in standing. Had difficulty lifting right foot. Left distal leg audibly broken with EMS on scene. Obvious deformity. Splinted by fire- pulses intact. On eliquis- did not hit head. Aox4. Obese.

## 2021-06-19 ENCOUNTER — Inpatient Hospital Stay (HOSPITAL_COMMUNITY): Payer: Medicare Other

## 2021-06-19 ENCOUNTER — Encounter (HOSPITAL_COMMUNITY): Payer: Self-pay | Admitting: Family Medicine

## 2021-06-19 ENCOUNTER — Inpatient Hospital Stay (HOSPITAL_COMMUNITY): Payer: Medicare Other | Admitting: Certified Registered Nurse Anesthetist

## 2021-06-19 ENCOUNTER — Emergency Department (HOSPITAL_COMMUNITY): Payer: Medicare Other

## 2021-06-19 ENCOUNTER — Encounter (HOSPITAL_COMMUNITY)
Admission: EM | Disposition: A | Discharge status: Skilled Nursing Facility | Payer: Self-pay | Source: Home / Self Care | Attending: Internal Medicine

## 2021-06-19 DIAGNOSIS — R296 Repeated falls: Secondary | ICD-10-CM | POA: Diagnosis present

## 2021-06-19 DIAGNOSIS — S82899A Other fracture of unspecified lower leg, initial encounter for closed fracture: Secondary | ICD-10-CM | POA: Insufficient documentation

## 2021-06-19 DIAGNOSIS — Z7951 Long term (current) use of inhaled steroids: Secondary | ICD-10-CM | POA: Diagnosis not present

## 2021-06-19 DIAGNOSIS — S8290XA Unspecified fracture of unspecified lower leg, initial encounter for closed fracture: Secondary | ICD-10-CM | POA: Diagnosis present

## 2021-06-19 DIAGNOSIS — I4891 Unspecified atrial fibrillation: Secondary | ICD-10-CM | POA: Diagnosis not present

## 2021-06-19 DIAGNOSIS — S301XXA Contusion of abdominal wall, initial encounter: Secondary | ICD-10-CM | POA: Diagnosis present

## 2021-06-19 DIAGNOSIS — F411 Generalized anxiety disorder: Secondary | ICD-10-CM | POA: Diagnosis present

## 2021-06-19 DIAGNOSIS — S82192A Other fracture of upper end of left tibia, initial encounter for closed fracture: Secondary | ICD-10-CM | POA: Diagnosis present

## 2021-06-19 DIAGNOSIS — F331 Major depressive disorder, recurrent, moderate: Secondary | ICD-10-CM | POA: Diagnosis present

## 2021-06-19 DIAGNOSIS — I480 Paroxysmal atrial fibrillation: Secondary | ICD-10-CM | POA: Diagnosis present

## 2021-06-19 DIAGNOSIS — Z86718 Personal history of other venous thrombosis and embolism: Secondary | ICD-10-CM

## 2021-06-19 DIAGNOSIS — M898X9 Other specified disorders of bone, unspecified site: Secondary | ICD-10-CM | POA: Diagnosis present

## 2021-06-19 DIAGNOSIS — D62 Acute posthemorrhagic anemia: Secondary | ICD-10-CM | POA: Diagnosis not present

## 2021-06-19 DIAGNOSIS — S8292XA Unspecified fracture of left lower leg, initial encounter for closed fracture: Secondary | ICD-10-CM | POA: Diagnosis not present

## 2021-06-19 DIAGNOSIS — S82832A Other fracture of upper and lower end of left fibula, initial encounter for closed fracture: Secondary | ICD-10-CM | POA: Diagnosis present

## 2021-06-19 DIAGNOSIS — S82892A Other fracture of left lower leg, initial encounter for closed fracture: Secondary | ICD-10-CM

## 2021-06-19 DIAGNOSIS — S82202A Unspecified fracture of shaft of left tibia, initial encounter for closed fracture: Secondary | ICD-10-CM | POA: Diagnosis present

## 2021-06-19 DIAGNOSIS — S82102A Unspecified fracture of upper end of left tibia, initial encounter for closed fracture: Secondary | ICD-10-CM

## 2021-06-19 DIAGNOSIS — Y92009 Unspecified place in unspecified non-institutional (private) residence as the place of occurrence of the external cause: Secondary | ICD-10-CM | POA: Diagnosis not present

## 2021-06-19 DIAGNOSIS — S92251A Displaced fracture of navicular [scaphoid] of right foot, initial encounter for closed fracture: Secondary | ICD-10-CM | POA: Diagnosis present

## 2021-06-19 DIAGNOSIS — S82402A Unspecified fracture of shaft of left fibula, initial encounter for closed fracture: Secondary | ICD-10-CM | POA: Diagnosis present

## 2021-06-19 DIAGNOSIS — Z7901 Long term (current) use of anticoagulants: Secondary | ICD-10-CM | POA: Diagnosis not present

## 2021-06-19 DIAGNOSIS — D6851 Activated protein C resistance: Secondary | ICD-10-CM

## 2021-06-19 DIAGNOSIS — D6859 Other primary thrombophilia: Secondary | ICD-10-CM

## 2021-06-19 DIAGNOSIS — Z6841 Body Mass Index (BMI) 40.0 and over, adult: Secondary | ICD-10-CM | POA: Diagnosis not present

## 2021-06-19 DIAGNOSIS — S93601A Unspecified sprain of right foot, initial encounter: Secondary | ICD-10-CM | POA: Diagnosis present

## 2021-06-19 DIAGNOSIS — Z79899 Other long term (current) drug therapy: Secondary | ICD-10-CM | POA: Diagnosis not present

## 2021-06-19 DIAGNOSIS — F418 Other specified anxiety disorders: Secondary | ICD-10-CM | POA: Diagnosis not present

## 2021-06-19 DIAGNOSIS — G8929 Other chronic pain: Secondary | ICD-10-CM | POA: Diagnosis present

## 2021-06-19 DIAGNOSIS — G4733 Obstructive sleep apnea (adult) (pediatric): Secondary | ICD-10-CM | POA: Diagnosis present

## 2021-06-19 DIAGNOSIS — G43009 Migraine without aura, not intractable, without status migrainosus: Secondary | ICD-10-CM | POA: Diagnosis present

## 2021-06-19 DIAGNOSIS — W06XXXA Fall from bed, initial encounter: Secondary | ICD-10-CM | POA: Diagnosis present

## 2021-06-19 DIAGNOSIS — K219 Gastro-esophageal reflux disease without esophagitis: Secondary | ICD-10-CM | POA: Diagnosis present

## 2021-06-19 DIAGNOSIS — E559 Vitamin D deficiency, unspecified: Secondary | ICD-10-CM | POA: Diagnosis present

## 2021-06-19 DIAGNOSIS — J45909 Unspecified asthma, uncomplicated: Secondary | ICD-10-CM | POA: Diagnosis present

## 2021-06-19 HISTORY — PX: TIBIA IM NAIL INSERTION: SHX2516

## 2021-06-19 LAB — SURGICAL PCR SCREEN
MRSA, PCR: NEGATIVE
Staphylococcus aureus: NEGATIVE

## 2021-06-19 LAB — CBC
HCT: 43 % (ref 36.0–46.0)
Hemoglobin: 13.4 g/dL (ref 12.0–15.0)
MCH: 30.6 pg (ref 26.0–34.0)
MCHC: 31.2 g/dL (ref 30.0–36.0)
MCV: 98.2 fL (ref 80.0–100.0)
Platelets: 236 10*3/uL (ref 150–400)
RBC: 4.38 MIL/uL (ref 3.87–5.11)
RDW: 13.8 % (ref 11.5–15.5)
WBC: 11.6 10*3/uL — ABNORMAL HIGH (ref 4.0–10.5)
nRBC: 0 % (ref 0.0–0.2)

## 2021-06-19 LAB — PROTIME-INR
INR: 2.7 — ABNORMAL HIGH (ref 0.8–1.2)
INR: 1.7 — ABNORMAL HIGH (ref 0.8–1.2)
Prothrombin Time: 28.2 seconds — ABNORMAL HIGH (ref 11.4–15.2)
Prothrombin Time: 19.6 seconds — ABNORMAL HIGH (ref 11.4–15.2)

## 2021-06-19 SURGERY — INSERTION, INTRAMEDULLARY ROD, TIBIA
Anesthesia: General | Site: Leg Lower | Laterality: Left

## 2021-06-19 MED ORDER — DEXMEDETOMIDINE (PRECEDEX) IN NS 20 MCG/5ML (4 MCG/ML) IV SYRINGE
PREFILLED_SYRINGE | INTRAVENOUS | Status: DC | PRN
  Administered 2021-06-19: 12 ug via INTRAVENOUS
  Administered 2021-06-19: 16 ug via INTRAVENOUS

## 2021-06-19 MED ORDER — SUGAMMADEX SODIUM 200 MG/2ML IV SOLN
INTRAVENOUS | Status: DC | PRN
  Administered 2021-06-19: 707.6 mg via INTRAVENOUS

## 2021-06-19 MED ORDER — TRAMADOL HCL 50 MG PO TABS
50.0000 mg | ORAL_TABLET | Freq: Four times a day (QID) | ORAL | Status: DC
  Administered 2021-06-19 – 2021-06-26 (×25): 50 mg via ORAL
  Filled 2021-06-19 (×27): qty 1

## 2021-06-19 MED ORDER — LIDOCAINE 2% (20 MG/ML) 5 ML SYRINGE
INTRAMUSCULAR | Status: DC | PRN
  Administered 2021-06-19: 100 mg via INTRAVENOUS

## 2021-06-19 MED ORDER — ONDANSETRON HCL 4 MG/2ML IJ SOLN: 4.0000 mg | Freq: Once | INTRAMUSCULAR | Status: DC | PRN

## 2021-06-19 MED ORDER — CHLORHEXIDINE GLUCONATE 0.12 % MT SOLN: 15.0000 mL | Freq: Once | OROMUCOSAL | Status: AC

## 2021-06-19 MED ORDER — FENTANYL CITRATE (PF) 250 MCG/5ML IJ SOLN
INTRAMUSCULAR | Status: AC
  Filled 2021-06-19: qty 5

## 2021-06-19 MED ORDER — VENLAFAXINE HCL ER 75 MG PO CP24
75.0000 mg | ORAL_CAPSULE | Freq: Every day | ORAL | Status: DC
Start: 2021-06-19 — End: 2021-06-19
  Filled 2021-06-19: qty 1

## 2021-06-19 MED ORDER — LORATADINE 10 MG PO TABS
10.0000 mg | ORAL_TABLET | Freq: Every day | ORAL | Status: DC
  Administered 2021-06-22 – 2021-06-26 (×5): 10 mg via ORAL
  Filled 2021-06-19 (×8): qty 1

## 2021-06-19 MED ORDER — ORAL CARE MOUTH RINSE: 15.0000 mL | Freq: Once | OROMUCOSAL | Status: AC

## 2021-06-19 MED ORDER — ACETAMINOPHEN 325 MG PO TABS
325.0000 mg | ORAL_TABLET | Freq: Four times a day (QID) | ORAL | Status: DC | PRN
  Administered 2021-06-22: 650 mg via ORAL
  Filled 2021-06-19: qty 2

## 2021-06-19 MED ORDER — VITAMIN K1 10 MG/ML IJ SOLN
5.0000 mg | Freq: Once | INTRAVENOUS | Status: AC
  Administered 2021-06-19: 5 mg via INTRAVENOUS
  Filled 2021-06-19: qty 0.5

## 2021-06-19 MED ORDER — 0.9 % SODIUM CHLORIDE (POUR BTL) OPTIME
TOPICAL | Status: DC | PRN
  Administered 2021-06-19: 1000 mL

## 2021-06-19 MED ORDER — ALBUTEROL SULFATE (2.5 MG/3ML) 0.083% IN NEBU: 2.5000 mg | INHALATION_SOLUTION | Freq: Four times a day (QID) | RESPIRATORY_TRACT | Status: DC | PRN

## 2021-06-19 MED ORDER — CALCIPOTRIENE 0.005 % EX CREA
1.0000 | TOPICAL_CREAM | Freq: Two times a day (BID) | CUTANEOUS | Status: DC
Start: 2021-06-19 — End: 2021-06-20

## 2021-06-19 MED ORDER — LACTATED RINGERS IV SOLN: INTRAVENOUS | Status: AC

## 2021-06-19 MED ORDER — CHLORHEXIDINE GLUCONATE 0.12 % MT SOLN
OROMUCOSAL | Status: AC
  Administered 2021-06-19: 15 mL via OROMUCOSAL
  Filled 2021-06-19: qty 15

## 2021-06-19 MED ORDER — OXYCODONE-ACETAMINOPHEN 5-325 MG PO TABS
1.0000 | ORAL_TABLET | ORAL | Status: DC | PRN
  Administered 2021-06-19 – 2021-06-24 (×5): 2 via ORAL
  Administered 2021-06-25: 1 via ORAL
  Administered 2021-06-25: 2 via ORAL
  Filled 2021-06-19 (×10): qty 2

## 2021-06-19 MED ORDER — FENTANYL CITRATE (PF) 250 MCG/5ML IJ SOLN
INTRAMUSCULAR | Status: DC | PRN
  Administered 2021-06-19: 100 ug via INTRAVENOUS
  Administered 2021-06-19: 150 ug via INTRAVENOUS
  Administered 2021-06-19 (×3): 50 ug via INTRAVENOUS
  Administered 2021-06-19: 100 ug via INTRAVENOUS

## 2021-06-19 MED ORDER — OXYCODONE HCL 5 MG/5ML PO SOLN: 5.0000 mg | Freq: Once | ORAL | Status: DC | PRN

## 2021-06-19 MED ORDER — FUROSEMIDE 40 MG PO TABS
40.0000 mg | ORAL_TABLET | Freq: Every day | ORAL | Status: DC
  Administered 2021-06-19 – 2021-06-26 (×5): 40 mg via ORAL
  Filled 2021-06-19 (×8): qty 1

## 2021-06-19 MED ORDER — VANCOMYCIN HCL 1000 MG IV SOLR
INTRAVENOUS | Status: AC
  Filled 2021-06-19: qty 20

## 2021-06-19 MED ORDER — METOCLOPRAMIDE HCL 5 MG PO TABS: 5.0000 mg | ORAL_TABLET | Freq: Three times a day (TID) | ORAL | Status: DC | PRN

## 2021-06-19 MED ORDER — LIDOCAINE 2% (20 MG/ML) 5 ML SYRINGE
INTRAMUSCULAR | Status: AC
  Filled 2021-06-19: qty 15

## 2021-06-19 MED ORDER — OXYCODONE HCL 5 MG PO TABS: 5.0000 mg | ORAL_TABLET | Freq: Once | ORAL | Status: DC | PRN

## 2021-06-19 MED ORDER — ONDANSETRON HCL 4 MG PO TABS: 4.0000 mg | ORAL_TABLET | Freq: Four times a day (QID) | ORAL | Status: DC | PRN

## 2021-06-19 MED ORDER — BETAMETHASONE DIPROPIONATE AUG 0.05 % EX CREA: 1.0000 "application " | TOPICAL_CREAM | Freq: Two times a day (BID) | CUTANEOUS | Status: DC

## 2021-06-19 MED ORDER — ROCURONIUM BROMIDE 10 MG/ML (PF) SYRINGE
PREFILLED_SYRINGE | INTRAVENOUS | Status: AC
  Filled 2021-06-19: qty 10

## 2021-06-19 MED ORDER — HYDROMORPHONE HCL 1 MG/ML IJ SOLN
1.0000 mg | INTRAMUSCULAR | Status: DC | PRN
  Administered 2021-06-19: 1 mg via INTRAVENOUS
  Filled 2021-06-19 (×2): qty 1

## 2021-06-19 MED ORDER — VANCOMYCIN HCL 1500 MG/300ML IV SOLN
1500.0000 mg | INTRAVENOUS | Status: AC
  Administered 2021-06-19: 1500 mg via INTRAVENOUS
  Filled 2021-06-19: qty 300

## 2021-06-19 MED ORDER — POVIDONE-IODINE 10 % EX SWAB
2.0000 "application " | Freq: Once | CUTANEOUS | Status: AC
  Administered 2021-06-19: 2 via TOPICAL

## 2021-06-19 MED ORDER — MOMETASONE FURO-FORMOTEROL FUM 200-5 MCG/ACT IN AERO
2.0000 | INHALATION_SPRAY | Freq: Two times a day (BID) | RESPIRATORY_TRACT | Status: DC
  Administered 2021-06-19 – 2021-06-20 (×2): 2 via RESPIRATORY_TRACT
  Filled 2021-06-19: qty 8.8

## 2021-06-19 MED ORDER — ROCURONIUM BROMIDE 10 MG/ML (PF) SYRINGE
PREFILLED_SYRINGE | INTRAVENOUS | Status: DC | PRN
  Administered 2021-06-19: 160 mg via INTRAVENOUS

## 2021-06-19 MED ORDER — TRIAMCINOLONE ACETONIDE 0.5 % EX CREA
TOPICAL_CREAM | Freq: Two times a day (BID) | CUTANEOUS | Status: DC
  Administered 2021-06-22: 1 via TOPICAL
  Filled 2021-06-19: qty 15

## 2021-06-19 MED ORDER — PROPOFOL 10 MG/ML IV BOLUS
INTRAVENOUS | Status: DC | PRN
  Administered 2021-06-19: 200 mg via INTRAVENOUS

## 2021-06-19 MED ORDER — CYCLOBENZAPRINE HCL 5 MG PO TABS
5.0000 mg | ORAL_TABLET | Freq: Three times a day (TID) | ORAL | Status: DC | PRN
Start: 2021-06-19 — End: 2021-06-26
  Administered 2021-06-22 – 2021-06-25 (×7): 5 mg via ORAL
  Filled 2021-06-19 (×7): qty 1

## 2021-06-19 MED ORDER — VANCOMYCIN HCL 1000 MG IV SOLR
INTRAVENOUS | Status: DC | PRN
  Administered 2021-06-19: 1000 mg

## 2021-06-19 MED ORDER — DEXAMETHASONE SODIUM PHOSPHATE 10 MG/ML IJ SOLN
INTRAMUSCULAR | Status: DC | PRN
  Administered 2021-06-19: 10 mg via INTRAVENOUS

## 2021-06-19 MED ORDER — SUMATRIPTAN SUCCINATE 100 MG PO TABS
100.0000 mg | ORAL_TABLET | ORAL | Status: DC | PRN
  Administered 2021-06-21: 100 mg via ORAL
  Filled 2021-06-19 (×2): qty 1

## 2021-06-19 MED ORDER — TRAMADOL HCL 50 MG PO TABS: 50.0000 mg | ORAL_TABLET | Freq: Four times a day (QID) | ORAL | Status: DC | PRN

## 2021-06-19 MED ORDER — SODIUM CHLORIDE 0.9 % IV SOLN: INTRAVENOUS | Status: DC

## 2021-06-19 MED ORDER — CHLORHEXIDINE GLUCONATE 4 % EX LIQD
60.0000 mL | Freq: Once | CUTANEOUS | Status: DC
  Filled 2021-06-19: qty 60

## 2021-06-19 MED ORDER — ONDANSETRON HCL 4 MG/2ML IJ SOLN
INTRAMUSCULAR | Status: AC
  Filled 2021-06-19: qty 6

## 2021-06-19 MED ORDER — LACTATED RINGERS IV SOLN: INTRAVENOUS | Status: DC

## 2021-06-19 MED ORDER — DOCUSATE SODIUM 100 MG PO CAPS
100.0000 mg | ORAL_CAPSULE | Freq: Two times a day (BID) | ORAL | Status: DC
  Administered 2021-06-22 – 2021-06-25 (×6): 100 mg via ORAL
  Filled 2021-06-19 (×13): qty 1

## 2021-06-19 MED ORDER — ONDANSETRON HCL 4 MG/2ML IJ SOLN
INTRAMUSCULAR | Status: DC | PRN
  Administered 2021-06-19: 4 mg via INTRAVENOUS

## 2021-06-19 MED ORDER — HYDROMORPHONE HCL 1 MG/ML IJ SOLN: 0.2500 mg | INTRAMUSCULAR | Status: DC | PRN

## 2021-06-19 MED ORDER — POLYETHYLENE GLYCOL 3350 17 G PO PACK: 17.0000 g | PACK | Freq: Every day | ORAL | Status: DC | PRN

## 2021-06-19 MED ORDER — HYDROMORPHONE HCL 1 MG/ML IJ SOLN
0.5000 mg | INTRAMUSCULAR | Status: DC | PRN
  Administered 2021-06-22 – 2021-06-25 (×4): 1 mg via INTRAVENOUS
  Filled 2021-06-19 (×4): qty 1

## 2021-06-19 MED ORDER — VANCOMYCIN HCL IN DEXTROSE 1-5 GM/200ML-% IV SOLN
1000.0000 mg | Freq: Two times a day (BID) | INTRAVENOUS | Status: AC
  Administered 2021-06-20: 1000 mg via INTRAVENOUS
  Filled 2021-06-19 (×2): qty 200

## 2021-06-19 MED ORDER — MONTELUKAST SODIUM 10 MG PO TABS
10.0000 mg | ORAL_TABLET | Freq: Every day | ORAL | Status: DC
  Administered 2021-06-19 – 2021-06-26 (×8): 10 mg via ORAL
  Filled 2021-06-19 (×8): qty 1

## 2021-06-19 MED ORDER — POTASSIUM CHLORIDE CRYS ER 10 MEQ PO TBCR
10.0000 meq | EXTENDED_RELEASE_TABLET | Freq: Two times a day (BID) | ORAL | Status: DC
  Administered 2021-06-19 – 2021-06-26 (×14): 10 meq via ORAL
  Filled 2021-06-19 (×16): qty 1

## 2021-06-19 MED ORDER — SUGAMMADEX SODIUM 500 MG/5ML IV SOLN
INTRAVENOUS | Status: AC
  Filled 2021-06-19: qty 5

## 2021-06-19 MED ORDER — VENLAFAXINE HCL ER 75 MG PO CP24
75.0000 mg | ORAL_CAPSULE | Freq: Every day | ORAL | Status: DC
  Administered 2021-06-19 – 2021-06-25 (×7): 75 mg via ORAL
  Filled 2021-06-19 (×7): qty 1

## 2021-06-19 MED ORDER — DICLOFENAC SODIUM 1 % EX GEL: 2.0000 g | CUTANEOUS | Status: DC | PRN

## 2021-06-19 MED ORDER — METOCLOPRAMIDE HCL 5 MG/ML IJ SOLN: 5.0000 mg | Freq: Three times a day (TID) | INTRAMUSCULAR | Status: DC | PRN

## 2021-06-19 MED ORDER — ONDANSETRON HCL 4 MG/2ML IJ SOLN
4.0000 mg | Freq: Four times a day (QID) | INTRAMUSCULAR | Status: DC | PRN
  Administered 2021-06-22: 4 mg via INTRAVENOUS
  Filled 2021-06-19: qty 2

## 2021-06-19 MED ORDER — DEXAMETHASONE SODIUM PHOSPHATE 10 MG/ML IJ SOLN
INTRAMUSCULAR | Status: AC
  Filled 2021-06-19: qty 3

## 2021-06-19 SURGICAL SUPPLY — 73 items
ADH SKN CLS APL DERMABOND .7 (GAUZE/BANDAGES/DRESSINGS) ×2
APL PRP STRL LF DISP 70% ISPRP (MISCELLANEOUS) ×1
BAG COUNTER SPONGE SURGICOUNT (BAG) ×2 IMPLANT
BAG SPNG CNTER NS LX DISP (BAG) ×1
BIT DRILL LONG 4.2 (BIT) ×1 IMPLANT
BIT DRILL SHORT 4.2 (BIT) IMPLANT
BLADE SURG 10 STRL SS (BLADE) ×2 IMPLANT
BNDG CMPR MED 10X6 ELC LF (GAUZE/BANDAGES/DRESSINGS) ×1
BNDG COHESIVE 4X5 TAN STRL (GAUZE/BANDAGES/DRESSINGS) ×2 IMPLANT
BNDG ELASTIC 4X5.8 VLCR STR LF (GAUZE/BANDAGES/DRESSINGS) ×1 IMPLANT
BNDG ELASTIC 6X10 VLCR STRL LF (GAUZE/BANDAGES/DRESSINGS) ×1 IMPLANT
BNDG ELASTIC 6X5.8 VLCR STR LF (GAUZE/BANDAGES/DRESSINGS) ×1 IMPLANT
BNDG GAUZE ELAST 4 BULKY (GAUZE/BANDAGES/DRESSINGS) ×1 IMPLANT
BRUSH SCRUB EZ PLAIN DRY (MISCELLANEOUS) ×3 IMPLANT
CHLORAPREP W/TINT 26 (MISCELLANEOUS) ×2 IMPLANT
COVER SURGICAL LIGHT HANDLE (MISCELLANEOUS) ×3 IMPLANT
DERMABOND ADVANCED (GAUZE/BANDAGES/DRESSINGS) ×2
DERMABOND ADVANCED .7 DNX12 (GAUZE/BANDAGES/DRESSINGS) IMPLANT
DRAPE C-ARM 42X72 X-RAY (DRAPES) ×2 IMPLANT
DRAPE C-ARMOR (DRAPES) ×2 IMPLANT
DRAPE HALF SHEET 40X57 (DRAPES) ×4 IMPLANT
DRAPE IMP U-DRAPE 54X76 (DRAPES) ×4 IMPLANT
DRAPE INCISE IOBAN 66X45 STRL (DRAPES) IMPLANT
DRAPE ORTHO SPLIT 77X108 STRL (DRAPES) ×4
DRAPE SURG ORHT 6 SPLT 77X108 (DRAPES) ×2 IMPLANT
DRAPE U-SHAPE 47X51 STRL (DRAPES) ×2 IMPLANT
DRESSING MEPILEX FLEX 4X4 (GAUZE/BANDAGES/DRESSINGS) IMPLANT
DRILL BIT SHORT 4.2 (BIT) ×4
DRSG ADAPTIC 3X8 NADH LF (GAUZE/BANDAGES/DRESSINGS) ×1 IMPLANT
DRSG MEPILEX BORDER 4X8 (GAUZE/BANDAGES/DRESSINGS) ×1 IMPLANT
DRSG MEPILEX FLEX 4X4 (GAUZE/BANDAGES/DRESSINGS) ×6
ELECT REM PT RETURN 9FT ADLT (ELECTROSURGICAL) ×2
ELECTRODE REM PT RTRN 9FT ADLT (ELECTROSURGICAL) ×1 IMPLANT
GAUZE SPONGE 4X4 12PLY STRL (GAUZE/BANDAGES/DRESSINGS) ×1 IMPLANT
GLOVE BIO SURGEON STRL SZ 6.5 (GLOVE) ×6 IMPLANT
GLOVE BIO SURGEON STRL SZ7 (GLOVE) ×1 IMPLANT
GLOVE BIO SURGEON STRL SZ7.5 (GLOVE) ×8 IMPLANT
GLOVE BIOGEL PI IND STRL 6.5 (GLOVE) ×1 IMPLANT
GLOVE BIOGEL PI IND STRL 7.0 (GLOVE) IMPLANT
GLOVE BIOGEL PI IND STRL 7.5 (GLOVE) ×1 IMPLANT
GLOVE BIOGEL PI INDICATOR 6.5 (GLOVE) ×1
GLOVE BIOGEL PI INDICATOR 7.0 (GLOVE) ×1
GLOVE BIOGEL PI INDICATOR 7.5 (GLOVE) ×1
GOWN STRL REUS W/ TWL LRG LVL3 (GOWN DISPOSABLE) ×2 IMPLANT
GOWN STRL REUS W/TWL LRG LVL3 (GOWN DISPOSABLE) ×8
GUIDEWIRE 3.2X400 (WIRE) ×1 IMPLANT
KIT BASIN OR (CUSTOM PROCEDURE TRAY) ×2 IMPLANT
KIT TURNOVER KIT B (KITS) ×2 IMPLANT
NAIL TFNA 10X330 (Nail) ×1 IMPLANT
PACK TOTAL JOINT (CUSTOM PROCEDURE TRAY) ×2 IMPLANT
PAD ARMBOARD 7.5X6 YLW CONV (MISCELLANEOUS) ×4 IMPLANT
PADDING CAST ABS 4INX4YD NS (CAST SUPPLIES) ×1
PADDING CAST ABS 6INX4YD NS (CAST SUPPLIES) ×1
PADDING CAST ABS COTTON 4X4 ST (CAST SUPPLIES) IMPLANT
PADDING CAST ABS COTTON 6X4 NS (CAST SUPPLIES) IMPLANT
REAMER ROD 3.8 BALL TIP 3X950 (ORTHOPEDIC DISPOSABLE SUPPLIES) ×1 IMPLANT
SCREW LOCK IM 46X5X TI NIOBIUM (Screw) IMPLANT
SCREW LOCK IM 68X5X IM NL (Spacer) IMPLANT
SCREW LOCK IM NAIL 5.0X34 (Screw) ×1 IMPLANT
SCREW LOCK IM NAIL 5X46 (Screw) ×4 IMPLANT
SCREW LOCK IM NAIL 5X66 (Screw) ×1 IMPLANT
SCREW LOCK IM NAIL 5X68 (Spacer) ×2 IMPLANT
SCREW LOCK IM NL 5X38 (Screw) ×1 IMPLANT
STAPLER VISISTAT 35W (STAPLE) ×1 IMPLANT
SUT MNCRL AB 3-0 PS2 18 (SUTURE) ×2 IMPLANT
SUT MNCRL AB 3-0 PS2 27 (SUTURE) ×2 IMPLANT
SUT VIC AB 0 CT1 27 (SUTURE) ×2
SUT VIC AB 0 CT1 27XBRD ANBCTR (SUTURE) IMPLANT
SUT VIC AB 2-0 CT1 27 (SUTURE) ×4
SUT VIC AB 2-0 CT1 TAPERPNT 27 (SUTURE) IMPLANT
TOWEL GREEN STERILE (TOWEL DISPOSABLE) ×3 IMPLANT
TOWEL GREEN STERILE FF (TOWEL DISPOSABLE) ×2 IMPLANT
YANKAUER SUCT BULB TIP NO VENT (SUCTIONS) IMPLANT

## 2021-06-19 NOTE — Progress Notes (Signed)
Orthopedic Tech Progress Note Patient Details:  Beth Marshall 10/01/1965 [id number]  Ortho Devices Type of Ortho Device: Long leg splint, Short leg splint, Stirrup splint Ortho Device/Splint Location: lle,rle Ortho Device/Splint Interventions: Ordered, Application, Adjustment   Post Interventions Patient Tolerated: Fair Application was difficult but did to the best of my ability.   Berenice Primas Jessyka Austria 06/19/2021, 3:34 AM

## 2021-06-19 NOTE — Hospital Course (Addendum)
56 y.o. 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 She lives alone and for the past week she has noticed increasing left foot pain.  Pain became so severe about 2 days PTA that she could barely walk.  She got up out of bed and fell.  She presented to [place] [facility] but reportedly had no significant findings.  Then earlier on 06/18/21  she had difficulty getting up from the toilet and then fell back onto the commode when she tried to stand up.  She then called EMS and after they stood her up she fell again and heard her knee pop. At baseline she requires assistant with a walker and has chronic leg pain. In the ED, she was found to have fractures in her left lateral, medial malleolus, comminuted displaced fracture of the left tibial shaft.  She also had avulsion fracture of the right navicular. ED PA discussed with orthopedic and admitted.  Patient underwent IM nailing of left proximal tibia 6/2 after vitamin K for coagulopathy.  She was resumed back on heparin and Coumadin next day given high risk for VTE and A-fib history.  Patient was complaining of right lower quadrant and right hip pain CT abdomen showed moderate size right rectus sheath hematoma 6/5 and heparin drip held, surgery consulted

## 2021-06-19 NOTE — Consult Note (Signed)
Reason for Consult:Left tibia fx Referring Physician: Antonieta Pert Time called: 1916 Time at bedside: 0932   Beth Marshall is an 56 y.o. female.  HPI: Tekila began to have left lower leg pain last Saturday. It happened out of the blue. She went to the ED at [facility] but they didn't have a diagnosis and sent her home. She had periods where she could barely walk and then periods when she couldn't at all. On Wednesday she returned to the ED and was again discharged. That night she went to the bathroom and couldn't get off the toilet. She called EMS and as they were helping her back to bed her lower leg audibly snapped and she fell to the ground. She was brought back to the ED where x-rays showed a proximal tibia fx and orthopedic surgery was consulted. She lives at home alone and ambulates with a RW.  Past Medical History:  Diagnosis Date   Abnormal coagulation profile 02/18/2015   Arthritis of right knee 12/20/2016   Back pain 02/18/2015   Blood clot in vein    Calculus of gallbladder with cholecystitis and obstruction 06/23/2014   Chronic deep vein thrombosis (DVT) of lower extremity ([place]) 10/27/2015   Dizziness 02/18/2015   Edema of extremities 02/18/2015   Elevated blood pressure reading 03/20/2015   Encounter for screening mammogram for breast cancer 10/27/2015   Factor V Leiden mutation ([place]) 10/27/2015   Gastroesophageal reflux disease 02/18/2015   Generalized anxiety disorder 02/18/2015   High risk medication use 07/25/2015   History of DVT (deep vein thrombosis) 03/15/2018   Hypokalemia 05/16/2015   Impaired fasting glucose 02/18/2015   Insomnia 02/18/2015   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   Lesion of ovary 02/18/2015   Lipoma 02/18/2015   Long term current use of anticoagulant therapy 03/20/2015   Migraine without aura and without status migrainosus, not intractable 06/25/2015   Moderate episode of recurrent major depressive disorder ([place]) 08/02/2018   Obesity, morbid, BMI 50 or higher ([place]) 02/18/2015    OSA (obstructive sleep apnea) 02/18/2015   Osteoarthritis 02/18/2015   Primary osteoarthritis of both knees 08/30/2017   Primary osteoarthritis of left knee 04/22/2015   Protein S deficiency ([place]) 02/18/2015   Shortness of breath 02/18/2015   Tachycardia 07/01/2015   Tachycardia 07/01/2015    Past Surgical History:  Procedure Laterality Date   ABDOMINAL HYSTERECTOMY      No family history on file.  Social History:  reports that she has never smoked. She does not have any smokeless tobacco history on file. She reports that she does not drink alcohol and does not use drugs.  Allergies:  Allergies  Allergen Reactions   Cephalexin Other (See Comments)    unknown unknown    Codeine Nausea And Vomiting   Diphenhydramine Hcl Itching   Ranitidine Rash    Medications: I have reviewed the patient's current medications.  Results for orders placed or performed during the hospital encounter of 06/18/21 (from the past 48 hour(s))  CBC with Differential     Status: None   Collection Time: 06/18/21 10:27 PM  Result Value Ref Range   WBC 10.2 4.0 - 10.5 K/uL   RBC 4.18 3.87 - 5.11 MIL/uL   Hemoglobin 12.8 12.0 - 15.0 g/dL   HCT 41.1 36.0 - 46.0 %   MCV 98.3 80.0 - 100.0 fL   MCH 30.6 26.0 - 34.0 pg   MCHC 31.1 30.0 - 36.0 g/dL   RDW 13.9 11.5 - 15.5 %   Platelets  246 150 - 400 K/uL   nRBC 0.0 0.0 - 0.2 %   Neutrophils Relative % 69 %   Neutro Abs 7.0 1.7 - 7.7 K/uL   Lymphocytes Relative 21 %   Lymphs Abs 2.1 0.7 - 4.0 K/uL   Monocytes Relative 9 %   Monocytes Absolute 0.9 0.1 - 1.0 K/uL   Eosinophils Relative 1 %   Eosinophils Absolute 0.1 0.0 - 0.5 K/uL   Basophils Relative 0 %   Basophils Absolute 0.0 0.0 - 0.1 K/uL   Immature Granulocytes 0 %   Abs Immature Granulocytes 0.03 0.00 - 0.07 K/uL    Comment: Performed at [facility] [address], [place], [place] [address]  Comprehensive metabolic panel     Status: Abnormal   Collection Time: 06/18/21 10:27 PM  Result  Value Ref Range   Sodium 136 135 - 145 mmol/L   Potassium 4.5 3.5 - 5.1 mmol/L    Comment: SPECIMEN HEMOLYZED. HEMOLYSIS MAY AFFECT INTEGRITY OF RESULTS.   Chloride 103 98 - 111 mmol/L   CO2 23 22 - 32 mmol/L   Glucose, Bld 151 (H) 70 - 99 mg/dL    Comment: Glucose reference range applies only to samples taken after fasting for at least 8 hours.   BUN 12 6 - 20 mg/dL   Creatinine, Ser 1.11 (H) 0.44 - 1.00 mg/dL   Calcium 9.2 8.9 - 10.3 mg/dL   Total Protein 6.7 6.5 - 8.1 g/dL   Albumin 3.7 3.5 - 5.0 g/dL   AST 33 15 - 41 U/L   ALT 20 0 - 44 U/L   Alkaline Phosphatase 85 38 - 126 U/L   Total Bilirubin 1.2 0.3 - 1.2 mg/dL   GFR, Estimated 59 (L) >60 mL/min    Comment: (NOTE) Calculated using the CKD-EPI Creatinine Equation (2021)    Anion gap 10 5 - 15    Comment: Performed at [facility] [address], [place], [place] [address]  Protime-INR     Status: Abnormal   Collection Time: 06/18/21 10:27 PM  Result Value Ref Range   Prothrombin Time 29.5 (H) 11.4 - 15.2 seconds   INR 2.8 (H) 0.8 - 1.2    Comment: (NOTE) INR goal varies based on device and disease states. Performed at [facility], [place] [address], [place], [place] [address]   Protime-INR     Status: Abnormal   Collection Time: 06/19/21  3:27 AM  Result Value Ref Range   Prothrombin Time 28.2 (H) 11.4 - 15.2 seconds   INR 2.7 (H) 0.8 - 1.2    Comment: (NOTE) INR goal varies based on device and disease states. Performed at [facility], [place] [address] [place], [place] [address]   CBC     Status: Abnormal   Collection Time: 06/19/21  3:27 AM  Result Value Ref Range   WBC 11.6 (H) 4.0 - 10.5 K/uL   RBC 4.38 3.87 - 5.11 MIL/uL   Hemoglobin 13.4 12.0 - 15.0 g/dL   HCT 43.0 36.0 - 46.0 %   MCV 98.2 80.0 - 100.0 fL   MCH 30.6 26.0 - 34.0 pg   MCHC 31.2 30.0 - 36.0 g/dL   RDW 13.8 11.5 - 15.5 %   Platelets 236 150 - 400 K/uL   nRBC 0.0 0.0 - 0.2 %    Comment: Performed at [facility], [place] [address] [place],  [address]    DG Tibia/Fibula Left  Result Date: 06/18/2021 CLINICAL DATA:  Status post fall. EXAM: LEFT TIBIA AND FIBULA - 2 VIEW COMPARISON:  None Available. FINDINGS: Acute fracture deformities are seen involving the proximal shafts of the left tibia and left fibula. Medial angulation of the distal fracture sites is seen. There is no evidence of dislocation. Chronic fracture deformities are noted along the visualized portion of the left medial malleolus and left lateral malleolus. Soft tissue swelling is seen at the level of the previously noted acute fractures. IMPRESSION: Acute fracture of the proximal shafts of the left tibia and left fibula. Electronically Signed   By: Virgina Norfolk M.D.   On: 06/18/2021 23:31   DG Ankle Complete Left  Addendum Date: 06/18/2021   ADDENDUM REPORT: 06/18/2021 23:38 ADDENDUM: A superficial soft tissue defect is seen along the anterior aspect of the left ankle at the level of the distal left tibial shaft. Electronically Signed   By: Virgina Norfolk M.D.   On: 06/18/2021 23:38   Result Date: 06/18/2021 CLINICAL DATA:  Status post fall. EXAM: LEFT ANKLE COMPLETE - 3+ VIEW COMPARISON:  None Available. FINDINGS: There is no evidence of an acute fracture, dislocation, or joint effusion. Chronic fracture deformities are seen involving the left lateral malleolus and left medial malleolus. Degenerative changes are seen along the dorsal aspect of the proximal and mid left foot. There is mild to moderate severity vascular calcification. Soft tissues are otherwise unremarkable. IMPRESSION: 1. No acute fracture or dislocation. 2. Chronic fracture deformities of the left lateral malleolus and left medial malleolus. Electronically Signed: By: Virgina Norfolk M.D. On: 06/18/2021 23:26   CT Knee Left Wo Contrast  Result Date: 06/19/2021 CLINICAL DATA:  Proximal tibial and fibular fractures on x-rays yesterday. CT requested for operative  planning. EXAM: CT OF THE LEFT KNEE WITHOUT CONTRAST TECHNIQUE: Multidetector CT imaging of the left knee was performed according to the standard protocol. Multiplanar CT image reconstructions were also generated. RADIATION DOSE REDUCTION: This exam was performed according to the departmental dose-optimization program which includes automated exposure control, adjustment of the mA and/or kV according to patient size and/or use of iterative reconstruction technique. COMPARISON:  Left knee plain films yesterday. FINDINGS: Bones/Joint/Cartilage Beam hardening artifact due to body habitus limits fine detail in the images. The bone mineralization is osteopenic. There are acute fractures of the proximal tibia and fibula. There is a transverse oblique proximal tibial shaft fracture beginning 6 cm below the tibial joint line with multiple comminution fragments along the anteromedial fracture margins and additional posterior comminution fragments. The comminution fragments demonstrate spreading around the fracture site, and the main distal tibial fragment is displaced laterally by up to 1/2 of a shaft width and anteriorly by about 1/3 of a shaft width with mild angulation towards the medial aspect. Just above this level is a comminuted transverse oblique fracture of the neck and proximal shaft of the fibula, with mild spreading of the comminution fragments, the distal fragment displaced laterally by an entire bone width and anteriorly by 1/2 of a shaft width, and the main distal fragment also mildly medially angulated. There is no tibial plateau fracture and no fracture line is seen extending into the proximal tibiofibular joint. There is advanced tricompartmental degenerative arthrosis of the knee with bulky reactive osteophytes and joint space loss greatest in the medial femorotibial joint where it is bone-on-bone. There is a small to moderate suprapatellar bursal effusion but it is low in density and not suspicious for  hemarthrosis. There is a 1.5  cm osteochondral loose body in the anterior central femorotibial joint space and a posterolateral fabella. Ligaments Suboptimally assessed by CT. Muscles and Tendons The tendons not well seen due to beam hardening but grossly intact as far as visualized. The imaged portion of the lower extremity demonstrates normal muscle bulk. There is no appreciable intramuscular hematoma in the proximal foreleg. Soft tissues There is moderate nonlocalizing patchy subcutaneous hemorrhage in the anterior proximal foreleg at the level of the fractures. There are calcifications in the popliteal artery. There are subcutaneous calcifications in the anterior proximal foreleg which are probably phleboliths. IMPRESSION: 1. Proximal tibial and fibular fractures as described above with comminution, and with medial angulation of the distal fragments. 2. Advanced tricompartmental degenerative arthrosis of the knee, with a 1.5 cm loose body in the anterior central joint space and a small to moderate low-density suprapatellar bursal effusion. 3. Moderate nonlocalizing subcutaneous soft tissue hemorrhage in the anterior foreleg at the level of the fractures. No appreciable intramuscular hematoma. 4. Calcifications in the popliteal artery. Greater than usually expected at this age. Electronically Signed   By: Telford Nab M.D.   On: 06/19/2021 01:11   DG Knee Complete 4 Views Left  Result Date: 06/18/2021 CLINICAL DATA:  Post fall with deformity. EXAM: LEFT KNEE - COMPLETE 4+ VIEW COMPARISON:  None Available. FINDINGS: Comminuted proximal tibial shaft fracture. Tibial shaft is displaced laterally with respect to the proximal tibia. No obvious intra-articular involvement. Comminuted displaced proximal fibular fracture just proximal to the tibial fracture site. Moderate underlying knee osteoarthritis. Cannot assess for knee joint effusion on provided views. IMPRESSION: 1. Comminuted displaced proximal tibial shaft  fracture without obvious intra-articular involvement. 2. Comminuted displaced proximal fibular fracture. Electronically Signed   By: Keith Rake M.D.   On: 06/18/2021 23:29   DG Ankle Right Port  Result Date: 06/19/2021 CLINICAL DATA:  Leg injury, pain. EXAM: PORTABLE RIGHT ANKLE - 2 VIEW COMPARISON:  Foot radiograph yesterday. FINDINGS: Bones are subjectively under mineralized. Small osseous density again seen adjacent to the dorsal navicular, age indeterminate avulsion fracture. No other fracture of the ankle. There is a well corticated density distal to the medial malleolus that is chronic. No mortise widening. Mild tibial talar degenerative change. Moderate plantar calcaneal spur. Age advanced arterial vascular calcifications. Mild generalized soft tissue edema. IMPRESSION: 1. Small osseous density adjacent to the dorsal navicular, age indeterminate avulsion fracture. No other acute fracture of the ankle. 2. Age advanced arterial vascular calcifications. Electronically Signed   By: Keith Rake M.D.   On: 06/19/2021 01:11   DG Foot Complete Left  Result Date: 06/18/2021 CLINICAL DATA:  Status post fall. EXAM: LEFT FOOT - COMPLETE 3+ VIEW COMPARISON:  None Available. FINDINGS: There is no evidence of an acute fracture or dislocation. Mild to moderate severity degenerative changes are seen along the dorsal aspect of the proximal to mid left foot. A superficial soft tissue defect is seen along the anterior aspect of the distal left tibial shaft. IMPRESSION: 1. No acute fracture or dislocation. 2. Mild to moderate severity degenerative changes. 3. Superficial soft tissue defect along the anterior aspect of the distal left tibial shaft. Electronically Signed   By: Virgina Norfolk M.D.   On: 06/18/2021 23:29   DG Foot Complete Right  Result Date: 06/18/2021 CLINICAL DATA:  Status post fall. EXAM: RIGHT FOOT COMPLETE - 3+ VIEW COMPARISON:  None Available. FINDINGS: Small fracture deformities of  indeterminate age are seen along the dorsal aspects of the right talus and  right navicular bone. There is no evidence of dislocation. A small to moderate sized plantar calcaneal spur is seen. Mild dorsal soft tissue swelling is noted. IMPRESSION: 1. Small fracture deformities of indeterminate age along the dorsal aspects of the right talus and right navicular bone. Correlation with physical examination is recommended to determine the presence of point tenderness. 2. Small to moderate-sized plantar calcaneal spur. 3. Mild dorsal soft tissue swelling. Electronically Signed   By: Virgina Norfolk M.D.   On: 06/18/2021 23:35    Review of Systems  HENT:  Negative for ear discharge, ear pain, hearing loss and tinnitus.   Eyes:  Negative for photophobia and pain.  Respiratory:  Negative for cough and shortness of breath.   Cardiovascular:  Negative for chest pain.  Gastrointestinal:  Negative for abdominal pain, nausea and vomiting.  Genitourinary:  Negative for dysuria, flank pain, frequency and urgency.  Musculoskeletal:  Positive for arthralgias (Left knee, right foot). Negative for back pain, myalgias and neck pain.  Neurological:  Negative for dizziness and headaches.  Hematological:  Does not bruise/bleed easily.  Psychiatric/Behavioral:  The patient is not nervous/anxious.   Blood pressure (!) 113/55, pulse 89, temperature 98.2 F (36.8 C), temperature source Oral, resp. rate 19, SpO2 92 %. Physical Exam Constitutional:      General: She is not in acute distress.    Appearance: She is well-developed. She is not diaphoretic.  HENT:     Head: Normocephalic and atraumatic.  Eyes:     General: No scleral icterus.       Right eye: No discharge.        Left eye: No discharge.     Conjunctiva/sclera: Conjunctivae normal.  Cardiovascular:     Rate and Rhythm: Normal rate and regular rhythm.  Pulmonary:     Effort: Pulmonary effort is normal. No respiratory distress.  Musculoskeletal:      Cervical back: Normal range of motion.     Comments: RLE No traumatic wounds, ecchymosis, or rash  Short leg splint in place, mild TTP over dorsal midfoot  No knee effusion  Knee stable to varus/ valgus and anterior/posterior stress  Sens DPN, SPN, TN intact  Motor EHL 5/5  DP 1+, No significant edema  LLE No traumatic wounds, ecchymosis, or rash  Long leg splint in place  Sens DPN, SPN, TN intact  Motor EHL 5/5  DP 1+, No significant edema  Skin:    General: Skin is warm and dry.  Neurological:     Mental Status: She is alert.  Psychiatric:        Mood and Affect: Mood normal.        Behavior: Behavior normal.    Assessment/Plan: Left tibia fx -- Plan IMN today with Dr. Doreatha Martin. Please keep NPO. Right foot fxs -- Unsure if fxs are new but even if so should do well with a CAM boot and WBAT.    Lisette Abu, PA-C Orthopedic Surgery [phone] 06/19/2021, 9:44 AM

## 2021-06-19 NOTE — Assessment & Plan Note (Signed)
Complicates her clinical course and makes her a difficult surgical candidate.

## 2021-06-19 NOTE — Progress Notes (Signed)
Orthopedic Tech Progress Note Patient Details:  Beth Marshall Jul 31, 1965 [id number] reapplied patients splint due to it being too short. Applied long leg Patient ID: Beth Marshall, female   DOB: September 14, 1965, 56 y.o.   MRN: [id number]  [name] 06/19/2021, 9:41 AM

## 2021-06-19 NOTE — Assessment & Plan Note (Addendum)
w/ factor V Leiden and protein S deficiency -Currently supratherapeutic with INR of 2.8.  Will hold overnight and pending repeat INR in the morning will need to initiate SQ lovenox for bridging pending further recommendations from orthopedic team.

## 2021-06-19 NOTE — ED Notes (Signed)
This paramedic and phlebotomy tried numerous times and have been unsuccessful in drawing labs.

## 2021-06-19 NOTE — H&P (Signed)
History and Physical    Patient: Beth Marshall EQA:[id number] DOB: 1965-08-29 DOA: 06/18/2021 DOS: the patient was seen and examined on 06/19/2021 PCP: Madison Hickman, FNP  Patient coming from: Home  Chief Complaint:  Chief Complaint  Patient presents with   Leg Injury   HPI: Beth Marshall is a 56 y.o. 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  Patient lives alone and for the past week she has noticed increasing left foot pain.  Pain became so severe about 2 days ago that she could barely walk.  She got up out of bed and fell.  She presented to [facility] yesterday but reportedly had no significant findings.  Then earlier today she had difficulty getting up from the toilet and then fell back onto the commode when she tried to stand up.  She then called EMS and after they stood her up she fell again and heard her knee pop.  At baseline she requires assistant with a walker and has chronic leg pain.  In the ED, she was found to have fractures in her left lateral, medial malleolus, comminuted displaced fracture of the left tibial shaft.  She also had avulsion fracture of the right navicular.  ED PA discussed with orthopedic, Dr. Stann Mainland will evaluate in the morning although given her super morbid obesity she might not be a good candidate for surgery.  Advise keeping n.p.o. after midnight and to hold Coumadin with start of SQ Lovenox in the morning. Splint immobilizers for both LE were ordered.     Review of Systems: As mentioned in the history of present illness. All other systems reviewed and are negative. Past Medical History:  Diagnosis Date   Abnormal coagulation profile 02/18/2015   Arthritis of right knee 12/20/2016   Back pain 02/18/2015   Blood clot in vein    Calculus of gallbladder with cholecystitis  and obstruction 06/23/2014   Chronic deep vein thrombosis (DVT) of lower extremity ([place]) 10/27/2015   Dizziness 02/18/2015   Edema of extremities 02/18/2015   Elevated blood pressure reading 03/20/2015   Encounter for screening mammogram for breast cancer 10/27/2015   Factor V Leiden mutation ([place]) 10/27/2015   Gastroesophageal reflux disease 02/18/2015   Generalized anxiety disorder 02/18/2015   High risk medication use 07/25/2015   History of DVT (deep vein thrombosis) 03/15/2018   Hypokalemia 05/16/2015   Impaired fasting glucose 02/18/2015   Insomnia 02/18/2015   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   Lesion of ovary 02/18/2015   Lipoma 02/18/2015   Long term current use of anticoagulant therapy 03/20/2015   Migraine without aura and without status migrainosus, not intractable 06/25/2015   Moderate episode of recurrent major depressive disorder ([place]) 08/02/2018   Obesity, morbid, BMI 50 or higher ([place]) 02/18/2015   OSA (obstructive sleep apnea) 02/18/2015   Osteoarthritis 02/18/2015   Primary osteoarthritis of both knees 08/30/2017   Primary osteoarthritis of left knee 04/22/2015   Protein S deficiency ([place]) 02/18/2015   Shortness of breath 02/18/2015   Tachycardia 07/01/2015   Tachycardia 07/01/2015   Past Surgical History:  Procedure Laterality Date   ABDOMINAL HYSTERECTOMY     Social History:  reports that she has never smoked. She does not have any smokeless tobacco history on file. She reports that she does not drink alcohol and does not use drugs.  Allergies  Allergen Reactions   Cephalexin Other (See Comments)    unknown unknown    Codeine Nausea And Vomiting   Diphenhydramine Hcl Itching   Ranitidine Rash    No family history on file.  Prior to Admission medications   Medication Sig Start Date End Date Taking? Authorizing Provider  albuterol (PROVENTIL HFA;VENTOLIN HFA) 108 (90 BASE) MCG/ACT inhaler Inhale 1 puff into the lungs every 6 (six) hours as needed for wheezing or shortness of breath.     [provider]  augmented betamethasone dipropionate (DIPROLENE-AF) 0.05 % cream Apply 1 application. topically 2 (two) times daily. 07/14/17   [provider]  budesonide-formoterol (SYMBICORT) 160-4.5 MCG/ACT inhaler Inhale 2 puffs into the lungs daily.    [provider]  calcipotriene (DOVONOX) 0.005 % cream Apply 1 application. topically 2 (two) times daily. 08/17/18   [provider]  cetirizine (ZYRTEC) 10 MG tablet Take 1 tablet by mouth daily. 01/16/19   [provider]  clotrimazole-betamethasone (LOTRISONE) cream Apply 1 application. topically 2 (two) times daily. 14 days 07/25/15   [provider]  cyclobenzaprine (FLEXERIL) 5 MG tablet Take 5 mg by mouth 3 (three) times daily as needed for muscle spasms.    [provider]  diclofenac Sodium (VOLTAREN) 1 % GEL Apply 2-3 g topically as needed for pain. 03/03/21   [provider]  furosemide (LASIX) 40 MG tablet Take 40 mg by mouth daily.    [provider]  montelukast (SINGULAIR) 10 MG tablet Take 1 tablet by mouth daily. 10/24/18   [provider]  mupirocin ointment (BACTROBAN) 2 % Apply 1 application. topically as needed. Apply to the affected area 08/19/15   [provider]  potassium chloride (K-DUR) 10 MEQ tablet Take 10 mEq by mouth 2 (two) times daily.    [provider]  SUMAtriptan (IMITREX) 100 MG tablet Take 1 tablet by mouth as needed for migraine. 07/18/15   [provider]  traMADol (ULTRAM) 50 MG tablet Take 1 tablet by mouth as needed for pain. 10/09/15   [provider]  venlafaxine XR (EFFEXOR-XR) 75 MG 24 hr capsule Take 1 capsule by mouth daily. 08/02/18   [provider]  warfarin (COUMADIN) 5 MG tablet Take 1 1/2 tablets daily or as directed by Coumadin Clinic. PLEASE KEEP UPCOMING CARDIOLOGY APPT FOR FUTURE REFILLS. 06/04/21   Park Liter, MD    Physical Exam: Vitals:   06/19/21  0245 06/19/21 0300 06/19/21 0315 06/19/21 0330  BP: 104/72 115/70 124/82 121/82  Pulse: (!) 114 (!) 124 (!) 124 86  Resp: '20 18 16 '$ (!) 25  Temp:      TempSrc:      SpO2: 100% 93% 96% 90%   Constitutional: NAD, calm, comfortable super morbidly obese female laying upright in bed Eyes: lids and conjunctivae normal ENMT: Mucous membranes are moist.  Neck: normal, supple Respiratory: clear to auscultation bilaterally, no wheezing, no crackles. Normal respiratory effort.  Cardiovascular: Regular rate and rhythm, no murmurs / rubs / gallops. No extremity edema. 2+ pedal pulses. No carotid bruits.  Abdomen: no tenderness, no masses palpated. Bowel sounds positive.  Musculoskeletal: no clubbing / cyanosis. No joint deformity upper and lower extremities. Unable to move left LE due to fracture and pain.  Focal tenderness to palpation of the right navicular and dorsal surface of the right foot.  2+ pedal pulses bilaterally. Skin: no rashes, lesions, ulcers. Neurologic: CN 2-12 grossly intact. Sensation intact.  Psychiatric: Normal judgment and insight.  Alert and oriented x 3. Normal mood. Data Reviewed:  See HPI  Assessment and Plan: * Fracture of lower extremity -chronic fracture of the left lateral malleolus and left medial malleolus Comminuted displaced left proximal tibial shaft fracture without obvious intra-articular involvement -Comminuted displaced left proximal fibular fracture  -fracture of the right talus and right navicular bone  -Place in immobilize splints overnight -orthopedic Dr. Francee Piccolo is aware of her case.  However given high likelihood of morbidity and possible mortality with her morbid obesity he was discussed with Ortho trauma team for further assistance.  He has advised holding anticoagulation and keep n.p.o. at midnight.  Atrial fibrillation (HCC) Holding warfarin as stated above.  Obesity, morbid, BMI 50 or higher ([place]) Complicates her clinical course and makes her a  difficult surgical candidate.  History of DVT (deep vein thrombosis) w/ factor V Leiden and protein S deficiency -Currently supratherapeutic with INR of 2.8.  Will hold overnight and pending repeat INR in the morning will need to initiate SQ lovenox for bridging pending further recommendations from orthopedic team.      Advance Care Planning:   Code Status: Full Code   Consults: orthopedic surgery  Family Communication: mother has already been previously been informed by EDP  Severity of Illness: The appropriate patient status for this patient is INPATIENT. Inpatient status is judged to be reasonable and necessary in order to provide the required intensity of service to ensure the patient's safety. The patient's presenting symptoms, physical exam findings, and initial radiographic and laboratory data in the context of their chronic comorbidities is felt to place them at high risk for further clinical deterioration. Furthermore, it is not anticipated that the patient will be medically stable for discharge from the hospital within 2 midnights of admission.   * I certify that at the point of admission it is my clinical judgment that the patient will require inpatient hospital care spanning beyond 2 midnights from the point of admission due to high intensity of service, high risk for further deterioration and high frequency of surveillance required.*  Author: Orene Desanctis, DO 06/19/2021 4:09 AM  For on call review [email or web address].

## 2021-06-19 NOTE — ED Notes (Addendum)
Pt spoke with Dr Jacqulynn Cadet regarding surgery. Pt wanted her mother called. Pts mother was called and given an update. [phone]

## 2021-06-19 NOTE — Progress Notes (Signed)
Case d/w EDP.  Appreciate medicine admit.  Will order right ankle films, but per review likely will be WBAT to Right leg.  Given her morbid obesity this is a difficult situation with high likelihood of morbidity and possible mortality.  Will need to discuss case ortho trauma team for hopeful assistance.  Please hold anticoagulation and keep NPO at MN.

## 2021-06-19 NOTE — Anesthesia Procedure Notes (Signed)
Procedure Name: Intubation Date/Time: 06/19/2021 12:43 PM Performed by: Minerva Ends, CRNA Pre-anesthesia Checklist: Patient identified, Emergency Drugs available, Suction available and Patient being monitored Patient Re-evaluated:Patient Re-evaluated prior to induction Oxygen Delivery Method: Circle system utilized Preoxygenation: Pre-oxygenation with 100% oxygen Induction Type: IV induction Ventilation: Mask ventilation without difficulty Laryngoscope Size: Mac and 3 Grade View: Grade I Tube type: Oral Tube size: 7.0 mm Number of attempts: 1 Airway Equipment and Method: Stylet and Oral airway Placement Confirmation: ETT inserted through vocal cords under direct vision, positive ETCO2 and breath sounds checked- equal and bilateral Secured at: 22 cm Tube secured with: Tape Dental Injury: Teeth and Oropharynx as per pre-operative assessment

## 2021-06-19 NOTE — Progress Notes (Signed)
    Pt is active with [facility] home-based palliative care division since 04/30/21.  She receives home visits by skilled nurse and medical SW.  Will follow hospital course and plan to resume care at time of d/c.  Please contact [facility] if we can assist with d/c planning.  CC can coexist with [facility] services if indicated at time of d/c.   Wynetta Fines, RN Office [phone]

## 2021-06-19 NOTE — ED Notes (Signed)
Pt went to X-ray.   

## 2021-06-19 NOTE — Progress Notes (Addendum)
ANTICOAGULATION CONSULT NOTE - Initial Consult  Pharmacy Consult for Warfarin and Heparin bridge (if needed) Indication: history of DVT with factor V Leiden and protein S deficiency, atrial fibrillation  Allergies  Allergen Reactions   Cephalexin Other (See Comments)    unknown unknown    Codeine Nausea And Vomiting   Diphenhydramine Hcl Itching   Ranitidine Rash    Patient Measurements:   Heparin Dosing Weight:   Vital Signs: BP: 113/55 (06/02 0830) Pulse Rate: 89 (06/02 0830)  Labs: Recent Labs    06/18/21 2227 06/19/21 0327  HGB 12.8 13.4  HCT 41.1 43.0  PLT 246 236  LABPROT 29.5* 28.2*  INR 2.8* 2.7*  CREATININE 1.11*  --     CrCl cannot be calculated (Unknown ideal weight.).   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 ([place]) 10/27/2015   Dizziness 02/18/2015   Edema of extremities 02/18/2015   Elevated blood pressure reading 03/20/2015   Encounter for screening mammogram for breast cancer 10/27/2015   Factor V Leiden mutation ([place]) 10/27/2015   Gastroesophageal reflux disease 02/18/2015   Generalized anxiety disorder 02/18/2015   High risk medication use 07/25/2015   History of DVT (deep vein thrombosis) 03/15/2018   Hypokalemia 05/16/2015   Impaired fasting glucose 02/18/2015   Insomnia 02/18/2015   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   Lesion of ovary 02/18/2015   Lipoma 02/18/2015   Long term current use of anticoagulant therapy 03/20/2015   Migraine without aura and without status migrainosus, not intractable 06/25/2015   Moderate episode of recurrent major depressive disorder ([place]) 08/02/2018   Obesity, morbid, BMI 50 or higher ([place]) 02/18/2015   OSA (obstructive sleep apnea) 02/18/2015   Osteoarthritis 02/18/2015    Primary osteoarthritis of both knees 08/30/2017   Primary osteoarthritis of left knee 04/22/2015   Protein S deficiency ([place]) 02/18/2015   Shortness of breath 02/18/2015   Tachycardia 07/01/2015   Tachycardia 07/01/2015    Medications:  (Not in a hospital admission)   Assessment: Pt on warfarin PTA for 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 Admission INR 2.7. CBC ok on admission. Home dose: 7.'5mg'$  daily - last 6/1 Reversed with vit K '5mg'$  IV 6/2. Plan for IM nailing today 6/2.  Goal of Therapy:  INR 2-3 Monitor platelets by anticoagulation protocol: Yes   Plan:  INR daily Plan to start heparin bridge if INR <2 F/u surgery plans - plans for today - likely start heparin post-OR  Sherlon Handing, PharmD, BCPS Please see amion for complete clinical pharmacist phone list 06/19/2021,11:11 AM  Addendum (1500): S/p L proximal tibia nailing. Ortho okay to restart anticoagulation POD #1 (6/3). Confirmed with Dr. Antonieta Pert that he would like heparin bridge if INR <2 on 6/3 a.m. check.   Sherlon Handing, PharmD, BCPS Please see amion for complete clinical pharmacist phone list 06/19/2021 3:04 PM

## 2021-06-19 NOTE — Assessment & Plan Note (Signed)
Holding warfarin as stated above.

## 2021-06-19 NOTE — TOC CAGE-AID Note (Signed)
Transition of Care [facility]) - CAGE-AID Screening   Patient Details  Name: Beth Marshall MRN: [id number] Date of Birth: [birth year]  [facility] CM/SW Contact:    Neoma Uhrich C Tarpley-Carter, [place] Phone Number: 06/19/2021, 9:07 AM   Clinical Narrative: Pt participated in National.  Pt stated she does not use substance or ETOH.  Pt was not offered resources, due to no usage of substance or ETOH.    Caylon Saine Tarpley-Carter, MSW, LCSW-A Pronouns:  She/Her/Hers [facility] Clinical Social Worker Direct Number:  [phone] [email or web address]  CAGE-AID Screening:    Have You Ever Felt You Ought to Cut Down on Your Drinking or Drug Use?: No Have People Annoyed You By [employer] Your Drinking Or Drug Use?: No Have You Felt Bad Or Guilty About Your Drinking Or Drug Use?: No Have You Ever Had a Drink or Used Drugs First Thing In The Morning to Steady Your Nerves or to Get Rid of a Hangover?: No CAGE-AID Score: 0  Substance Abuse Education Offered: No

## 2021-06-19 NOTE — Progress Notes (Signed)
Patient seen and examined personally, I reviewed the chart, history and physical and admission note, done by admitting physician this morning and agree with the same with following addendum.  Please refer to the morning admission note for more detailed plan of care.  Briefly, 56 y.o. 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 She lives alone and for the past week she has noticed increasing left foot pain.  Pain became so severe about 2 days PTA that she could barely walk.  She got up out of bed and fell.  She presented to [facility] but reportedly had no significant findings.  Then earlier on 06/18/21  she had difficulty getting up from the toilet and then fell back onto the commode when she tried to stand up.  She then called EMS and after they stood her up she fell again and heard her knee pop. At baseline she requires assistant with a walker and has chronic leg pain. In the ED, she was found to have fractures in her left lateral, medial malleolus, comminuted displaced fracture of the left tibial shaft.  She also had avulsion fracture of the right navicular. ED PA discussed with orthopedic, Dr. Stann Mainland will evaluate in the morning although given her super morbid obesity she might not be a good candidate for surgery.  Advise keeping n.p.o. after midnight and to hold Coumadin.    On exam this morning patient is alert awake oriented pain is controlled. Hemodynamically stable Mild leukocytosis in CBC INR at 2.7  Fracture of lower extremity-left tibia: Awaiting for orthopedic plan possible IM nailing with Dr. Doreatha Martin continue to keep NPO.  INR 2.7, defer to orthopedics if-it needs to be reversed-we will need bridged with heparin given her VTE hx/Factor v leidn mutation-pharmacy consulted  Right foot fracture unsure if new  or old, advised cam boot WBAT  History of DVT Factor V Leiden mutation Protein S deficiency: On Coumadin but currently on hold.  See above Obesity, morbid, BMI 50 or higher-currently losing weight cc.  OSA intolerant to CPAP.  Atrial fibrillation-monitoring in tele.  Hold Lasix.

## 2021-06-19 NOTE — Op Note (Signed)
Orthopaedic Surgery Operative Note (CSN: [id number] ) Date of Surgery: 06/19/2021  Admit Date: 06/18/2021   Diagnoses: Pre-Op Diagnoses: Left proximal tibial shaft fracture Super morbid obesity  Post-Op Diagnosis: Same  Procedures: CPT 27759-Intramedullary nailing of left proximal tibia  Surgeons : Primary: Shona Needles, MD  Assistant: Patrecia Pace, PA-C  Location: OR 3   Anesthesia:General   Antibiotics: Vancomycin IV and 1 gm topically placed   Tourniquet time: None    Estimated Blood Loss: 50 mL  Complications:None   Specimens:None   Implants: Implant Name Type Inv. Item Serial No. Manufacturer Lot No. LRB No. Used Action  NAIL TFNA [id number] - [id number] Nail NAIL TFNA 10X330  [employer] [id number] Left 1 Implanted  SCREW LOCK IM NAIL 5.0X34 - [id number] Screw SCREW LOCK IM NAIL 5.0X34  [employer]  Left 1 Implanted  SCREW LOCK IM NL 5X38 - [id number] Screw SCREW LOCK IM NL 5X38  [employer]  Left 1 Implanted  SCREW LOCK IM NAIL 5X46 - [id number] Screw SCREW LOCK IM NAIL 5X46  [employer]  Left 2 Implanted  SCREW LOCK IM NAIL 5X66 - [id number] Screw SCREW LOCK IM NAIL 5X66  [employer]  Left 1 Implanted  SCREW LOCK IM NAIL 5X68 - [id number] Spacer SCREW LOCK IM NAIL 5X68  [employer]  Left 1 Implanted     Indications for Surgery: 56 year old female who had a ground-level fall sustained a left proximal tibia fracture.  She has a history of super morbid obesity with BMI over 60.  Due to the unstable nature of her injury I recommended proceeding with intramedullary nailing of her left tibia.  Risk and benefits were discussed with the patient.  Risks include but not limited to bleeding, infection, malunion, nonunion, hardware failure, hardware irritation, nerve blood vessel injury, DVT, even possible anesthetic complications.  She agreed to proceed with surgery and consent was obtained.  Operative Findings: Intramedullary nailing of left  proximal tibia fracture using Synthes TNA 10 x 330 mm nail  Procedure: The patient was identified in the preoperative holding area. Consent was confirmed with the patient and their family and all questions were answered. The operative extremity was marked after confirmation with the patient. she was then brought back to the operating room by our anesthesia colleagues.  She was placed under general anesthetic and carefully transferred over to radiolucent flat top table.  The left lower extremity was prepped and draped in usual sterile fashion.  A timeout was performed to verify the patient, the procedure, and the extremity.  Preoperative antibiotics were dosed.  I first obtained fluoroscopic imaging showed the unstable nature of her injury.  Her body habitus made it difficult to reduce and get appropriate imaging but we are able to adjust.  I made a lateral parapatellar incision unfortunately due to the size of her thigh I was not able to get the correct trajectory for a lateral parapatellar insertion.  She also had a severely arthritic knee which brought her patella in a valgus alignment.  Through that incision I was able to palpate the medial border of the patella and patellar tendon.  I then made an accessory portal in her distal thigh to be able to appropriately insert a threaded guidewire at the starting point.  I entered just medial parapatellar and placed it where AP and lateral fluoroscopic imaging showed a was appropriately positioned.  I then advanced it into the proximal metaphysis.  I then used an awl to enter the medullary canal.  I  then passed the ball-tipped guidewire down the center canal and seated into the distal metaphysis.  I measured the length and chose to use a 330 mm nail.  I then sequentially reamed from 8 mm to 11 mm.  I then passed a 10 x 330 mm Synthes nail attached to the suprapatellar targeting arm due to her body habitus.  Once the fracture was aligned I then placed 4 proximal  interlocking screws using the targeting arm.  I then used perfect circle technique to place 2 distal medial to lateral interlocking screws.  The targeting arm was then removed.  Final fluoroscopic imaging was obtained.  The incision was copiously irrigated.  A gram of vancomycin powder was placed into the incision.  A layered closure of 2-0 Vicryl 3-0 Monocryl and nylon was used to close the skin.  Sterile dressings were applied.  The patient was then awoke from anesthesia and taken to the PACU in stable condition.  Post Op Plan/Instructions: Patient will be weightbearing as tolerated to the right lower extremity.  She will be touchdown weightbearing to the left lower extremity.  She will receive postoperative antibiotics.  She will be restarted on her Coumadin postoperative day 1.  We will have her mobilize with physical and Occupational Therapy.  I was present and performed the entire surgery.  Patrecia Pace, PA-C did assist me throughout the case. An assistant was necessary given the difficulty in approach, maintenance of reduction and ability to instrument the fracture.   Katha Hamming, MD [facility]

## 2021-06-19 NOTE — Assessment & Plan Note (Addendum)
-  chronic fracture of the left lateral malleolus and left medial malleolus Comminuted displaced left proximal tibial shaft fracture without obvious intra-articular involvement -Comminuted displaced left proximal fibular fracture  -fracture of the right talus and right navicular bone  -Place in immobilize splints overnight -orthopedic Dr. Francee Piccolo is aware of her case.  However given high likelihood of morbidity and possible mortality with her morbid obesity he was discussed with Ortho trauma team for further assistance.  He has advised holding anticoagulation and keep n.p.o. at midnight.

## 2021-06-19 NOTE — Interval H&P Note (Signed)
History and Physical Interval Note:  06/19/2021 11:38 AM  Beth Marshall  has presented today for surgery, with the diagnosis of Left proximal tibia fracture.  The various methods of treatment have been discussed with the patient and family. After consideration of risks, benefits and other options for treatment, the patient has consented to  Procedure(s): INTRAMEDULLARY (IM) NAIL TIBIAL (Left) as a surgical intervention.  The patient's history has been reviewed, patient examined, no change in status, stable for surgery.  I have reviewed the patient's chart and labs.  Questions were answered to the patient's satisfaction.     Lennette Bihari P Diezel Mazur

## 2021-06-19 NOTE — Anesthesia Postprocedure Evaluation (Signed)
Anesthesia Post Note  Patient: Beth Marshall  Procedure(s) Performed: INTRAMEDULLARY (IM) NAIL TIBIAL (Left: Leg Lower)     Patient location during evaluation: PACU Anesthesia Type: General Level of consciousness: awake and alert and oriented Pain management: pain level controlled Vital Signs Assessment: post-procedure vital signs reviewed and stable Respiratory status: spontaneous breathing, nonlabored ventilation and respiratory function stable Cardiovascular status: blood pressure returned to baseline and stable Postop Assessment: no apparent nausea or vomiting Anesthetic complications: no   No notable events documented.  Last Vitals:  Vitals:   06/19/21 1450 06/19/21 1519  BP: 127/70 115/79  Pulse: 92 100  Resp: 13 17  Temp: 36.6 C 36.6 C  SpO2: 96% 95%    Last Pain:  Vitals:   06/19/21 1450  TempSrc:   PainSc: 0-No pain                 Berman Grainger A.

## 2021-06-19 NOTE — Transfer of Care (Signed)
Immediate Anesthesia Transfer of Care Note  Patient: Beth Marshall  Procedure(s) Performed: INTRAMEDULLARY (IM) NAIL TIBIAL (Left: Leg Lower)  Patient Location: PACU  Anesthesia Type:General  Level of Consciousness: awake, alert  and oriented  Airway & Oxygen Therapy: Patient Spontanous Breathing  Post-op Assessment: Report given to RN and Post -op Vital signs reviewed and stable  Post vital signs: Reviewed and stable  Last Vitals:  Vitals Value Taken Time  BP 131/71 06/19/21 1417  Temp    Pulse 101 06/19/21 1419  Resp 11 06/19/21 1419  SpO2 96 % 06/19/21 1419  Vitals shown include unvalidated device data.  Last Pain:  Vitals:   06/19/21 1145  TempSrc:   PainSc: 5          Complications: No notable events documented.

## 2021-06-19 NOTE — H&P (View-Only) (Signed)
Reason for Consult:Left tibia fx Referring Physician: Antonieta Pert Time called: [id number] Time at bedside: 0932   Beth Marshall is an 56 y.o. female.  HPI: Ainslie began to have left lower leg pain last Saturday. It happened out of the blue. She went to the ED at [facility] but they didn't have a diagnosis and sent her home. She had periods where she could barely walk and then periods when she couldn't at all. On Wednesday she returned to the ED and was again discharged. That night she went to the bathroom and couldn't get off the toilet. She called EMS and as they were helping her back to bed her lower leg audibly snapped and she fell to the ground. She was brought back to the ED where x-rays showed a proximal tibia fx and orthopedic surgery was consulted. She lives at home alone and ambulates with a RW.  Past Medical History:  Diagnosis Date   Abnormal coagulation profile 02/18/2015   Arthritis of right knee 12/20/2016   Back pain 02/18/2015   Blood clot in vein    Calculus of gallbladder with cholecystitis and obstruction 06/23/2014   Chronic deep vein thrombosis (DVT) of lower extremity ([place]) 10/27/2015   Dizziness 02/18/2015   Edema of extremities 02/18/2015   Elevated blood pressure reading 03/20/2015   Encounter for screening mammogram for breast cancer 10/27/2015   Factor V Leiden mutation ([place]) 10/27/2015   Gastroesophageal reflux disease 02/18/2015   Generalized anxiety disorder 02/18/2015   High risk medication use 07/25/2015   History of DVT (deep vein thrombosis) 03/15/2018   Hypokalemia 05/16/2015   Impaired fasting glucose 02/18/2015   Insomnia 02/18/2015   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   Lesion of ovary 02/18/2015   Lipoma 02/18/2015   Long term current use of anticoagulant therapy 03/20/2015   Migraine without aura and without status migrainosus, not intractable 06/25/2015   Moderate episode of recurrent major depressive disorder ([place]) 08/02/2018   Obesity, morbid, BMI 50 or higher ([place]) 02/18/2015    OSA (obstructive sleep apnea) 02/18/2015   Osteoarthritis 02/18/2015   Primary osteoarthritis of both knees 08/30/2017   Primary osteoarthritis of left knee 04/22/2015   Protein S deficiency ([place]) 02/18/2015   Shortness of breath 02/18/2015   Tachycardia 07/01/2015   Tachycardia 07/01/2015    Past Surgical History:  Procedure Laterality Date   ABDOMINAL HYSTERECTOMY      No family history on file.  Social History:  reports that she has never smoked. She does not have any smokeless tobacco history on file. She reports that she does not drink alcohol and does not use drugs.  Allergies:  Allergies  Allergen Reactions   Cephalexin Other (See Comments)    unknown unknown    Codeine Nausea And Vomiting   Diphenhydramine Hcl Itching   Ranitidine Rash    Medications: I have reviewed the patient's current medications.  Results for orders placed or performed during the hospital encounter of 06/18/21 (from the past 48 hour(s))  CBC with Differential     Status: None   Collection Time: 06/18/21 10:27 PM  Result Value Ref Range   WBC 10.2 4.0 - 10.5 K/uL   RBC 4.18 3.87 - 5.11 MIL/uL   Hemoglobin 12.8 12.0 - 15.0 g/dL   HCT 41.1 36.0 - 46.0 %   MCV 98.3 80.0 - 100.0 fL   MCH 30.6 26.0 - 34.0 pg   MCHC 31.1 30.0 - 36.0 g/dL   RDW 13.9 11.5 - 15.5 %   Platelets  246 150 - 400 K/uL   nRBC 0.0 0.0 - 0.2 %   Neutrophils Relative % 69 %   Neutro Abs 7.0 1.7 - 7.7 K/uL   Lymphocytes Relative 21 %   Lymphs Abs 2.1 0.7 - 4.0 K/uL   Monocytes Relative 9 %   Monocytes Absolute 0.9 0.1 - 1.0 K/uL   Eosinophils Relative 1 %   Eosinophils Absolute 0.1 0.0 - 0.5 K/uL   Basophils Relative 0 %   Basophils Absolute 0.0 0.0 - 0.1 K/uL   Immature Granulocytes 0 %   Abs Immature Granulocytes 0.03 0.00 - 0.07 K/uL    Comment: Performed at [facility] [address] [place], [place] [address]  Comprehensive metabolic panel     Status: Abnormal   Collection Time: 06/18/21 10:27 PM  Result  Value Ref Range   Sodium 136 135 - 145 mmol/L   Potassium 4.5 3.5 - 5.1 mmol/L    Comment: SPECIMEN HEMOLYZED. HEMOLYSIS MAY AFFECT INTEGRITY OF RESULTS.   Chloride 103 98 - 111 mmol/L   CO2 23 22 - 32 mmol/L   Glucose, Bld 151 (H) 70 - 99 mg/dL    Comment: Glucose reference range applies only to samples taken after fasting for at least 8 hours.   BUN 12 6 - 20 mg/dL   Creatinine, Ser 1.11 (H) 0.44 - 1.00 mg/dL   Calcium 9.2 8.9 - 10.3 mg/dL   Total Protein 6.7 6.5 - 8.1 g/dL   Albumin 3.7 3.5 - 5.0 g/dL   AST 33 15 - 41 U/L   ALT 20 0 - 44 U/L   Alkaline Phosphatase 85 38 - 126 U/L   Total Bilirubin 1.2 0.3 - 1.2 mg/dL   GFR, Estimated 59 (L) >60 mL/min    Comment: (NOTE) Calculated using the CKD-EPI Creatinine Equation (2021)    Anion gap 10 5 - 15    Comment: Performed at [facility] [address], [place], [place] [address]  Protime-INR     Status: Abnormal   Collection Time: 06/18/21 10:27 PM  Result Value Ref Range   Prothrombin Time 29.5 (H) 11.4 - 15.2 seconds   INR 2.8 (H) 0.8 - 1.2    Comment: (NOTE) INR goal varies based on device and disease states. Performed at [facility], [place] [address] [place], [place] [address]   Protime-INR     Status: Abnormal   Collection Time: 06/19/21  3:27 AM  Result Value Ref Range   Prothrombin Time 28.2 (H) 11.4 - 15.2 seconds   INR 2.7 (H) 0.8 - 1.2    Comment: (NOTE) INR goal varies based on device and disease states. Performed at [facility], [place] [address], [place], [place] [address]   CBC     Status: Abnormal   Collection Time: 06/19/21  3:27 AM  Result Value Ref Range   WBC 11.6 (H) 4.0 - 10.5 K/uL   RBC 4.38 3.87 - 5.11 MIL/uL   Hemoglobin 13.4 12.0 - 15.0 g/dL   HCT 43.0 36.0 - 46.0 %   MCV 98.2 80.0 - 100.0 fL   MCH 30.6 26.0 - 34.0 pg   MCHC 31.2 30.0 - 36.0 g/dL   RDW 13.8 11.5 - 15.5 %   Platelets 236 150 - 400 K/uL   nRBC 0.0 0.0 - 0.2 %    Comment: Performed at [facility], [place] [address] [place], [place] [address]    DG Tibia/Fibula Left  Result Date: 06/18/2021 CLINICAL DATA:  Status post fall. EXAM: LEFT TIBIA AND FIBULA - 2 VIEW COMPARISON:  None Available. FINDINGS: Acute fracture deformities are seen involving the proximal shafts of the left tibia and left fibula. Medial angulation of the distal fracture sites is seen. There is no evidence of dislocation. Chronic fracture deformities are noted along the visualized portion of the left medial malleolus and left lateral malleolus. Soft tissue swelling is seen at the level of the previously noted acute fractures. IMPRESSION: Acute fracture of the proximal shafts of the left tibia and left fibula. Electronically Signed   By: Virgina Norfolk M.D.   On: 06/18/2021 23:31   DG Ankle Complete Left  Addendum Date: 06/18/2021   ADDENDUM REPORT: 06/18/2021 23:38 ADDENDUM: A superficial soft tissue defect is seen along the anterior aspect of the left ankle at the level of the distal left tibial shaft. Electronically Signed   By: Virgina Norfolk M.D.   On: 06/18/2021 23:38   Result Date: 06/18/2021 CLINICAL DATA:  Status post fall. EXAM: LEFT ANKLE COMPLETE - 3+ VIEW COMPARISON:  None Available. FINDINGS: There is no evidence of an acute fracture, dislocation, or joint effusion. Chronic fracture deformities are seen involving the left lateral malleolus and left medial malleolus. Degenerative changes are seen along the dorsal aspect of the proximal and mid left foot. There is mild to moderate severity vascular calcification. Soft tissues are otherwise unremarkable. IMPRESSION: 1. No acute fracture or dislocation. 2. Chronic fracture deformities of the left lateral malleolus and left medial malleolus. Electronically Signed: By: Virgina Norfolk M.D. On: 06/18/2021 23:26   CT Knee Left Wo Contrast  Result Date: 06/19/2021 CLINICAL DATA:  Proximal tibial and fibular fractures on x-rays yesterday. CT requested for operative  planning. EXAM: CT OF THE LEFT KNEE WITHOUT CONTRAST TECHNIQUE: Multidetector CT imaging of the left knee was performed according to the standard protocol. Multiplanar CT image reconstructions were also generated. RADIATION DOSE REDUCTION: This exam was performed according to the departmental dose-optimization program which includes automated exposure control, adjustment of the mA and/or kV according to patient size and/or use of iterative reconstruction technique. COMPARISON:  Left knee plain films yesterday. FINDINGS: Bones/Joint/Cartilage Beam hardening artifact due to body habitus limits fine detail in the images. The bone mineralization is osteopenic. There are acute fractures of the proximal tibia and fibula. There is a transverse oblique proximal tibial shaft fracture beginning 6 cm below the tibial joint line with multiple comminution fragments along the anteromedial fracture margins and additional posterior comminution fragments. The comminution fragments demonstrate spreading around the fracture site, and the main distal tibial fragment is displaced laterally by up to 1/2 of a shaft width and anteriorly by about 1/3 of a shaft width with mild angulation towards the medial aspect. Just above this level is a comminuted transverse oblique fracture of the neck and proximal shaft of the fibula, with mild spreading of the comminution fragments, the distal fragment displaced laterally by an entire bone width and anteriorly by 1/2 of a shaft width, and the main distal fragment also mildly medially angulated. There is no tibial plateau fracture and no fracture line is seen extending into the proximal tibiofibular joint. There is advanced tricompartmental degenerative arthrosis of the knee with bulky reactive osteophytes and joint space loss greatest in the medial femorotibial joint where it is bone-on-bone. There is a small to moderate suprapatellar bursal effusion but it is low in density and not suspicious for  hemarthrosis. There is a 1.5  cm osteochondral loose body in the anterior central femorotibial joint space and a posterolateral fabella. Ligaments Suboptimally assessed by CT. Muscles and Tendons The tendons not well seen due to beam hardening but grossly intact as far as visualized. The imaged portion of the lower extremity demonstrates normal muscle bulk. There is no appreciable intramuscular hematoma in the proximal foreleg. Soft tissues There is moderate nonlocalizing patchy subcutaneous hemorrhage in the anterior proximal foreleg at the level of the fractures. There are calcifications in the popliteal artery. There are subcutaneous calcifications in the anterior proximal foreleg which are probably phleboliths. IMPRESSION: 1. Proximal tibial and fibular fractures as described above with comminution, and with medial angulation of the distal fragments. 2. Advanced tricompartmental degenerative arthrosis of the knee, with a 1.5 cm loose body in the anterior central joint space and a small to moderate low-density suprapatellar bursal effusion. 3. Moderate nonlocalizing subcutaneous soft tissue hemorrhage in the anterior foreleg at the level of the fractures. No appreciable intramuscular hematoma. 4. Calcifications in the popliteal artery. Greater than usually expected at this age. Electronically Signed   By: Telford Nab M.D.   On: 06/19/2021 01:11   DG Knee Complete 4 Views Left  Result Date: 06/18/2021 CLINICAL DATA:  Post fall with deformity. EXAM: LEFT KNEE - COMPLETE 4+ VIEW COMPARISON:  None Available. FINDINGS: Comminuted proximal tibial shaft fracture. Tibial shaft is displaced laterally with respect to the proximal tibia. No obvious intra-articular involvement. Comminuted displaced proximal fibular fracture just proximal to the tibial fracture site. Moderate underlying knee osteoarthritis. Cannot assess for knee joint effusion on provided views. IMPRESSION: 1. Comminuted displaced proximal tibial shaft  fracture without obvious intra-articular involvement. 2. Comminuted displaced proximal fibular fracture. Electronically Signed   By: Keith Rake M.D.   On: 06/18/2021 23:29   DG Ankle Right Port  Result Date: 06/19/2021 CLINICAL DATA:  Leg injury, pain. EXAM: PORTABLE RIGHT ANKLE - 2 VIEW COMPARISON:  Foot radiograph yesterday. FINDINGS: Bones are subjectively under mineralized. Small osseous density again seen adjacent to the dorsal navicular, age indeterminate avulsion fracture. No other fracture of the ankle. There is a well corticated density distal to the medial malleolus that is chronic. No mortise widening. Mild tibial talar degenerative change. Moderate plantar calcaneal spur. Age advanced arterial vascular calcifications. Mild generalized soft tissue edema. IMPRESSION: 1. Small osseous density adjacent to the dorsal navicular, age indeterminate avulsion fracture. No other acute fracture of the ankle. 2. Age advanced arterial vascular calcifications. Electronically Signed   By: Keith Rake M.D.   On: 06/19/2021 01:11   DG Foot Complete Left  Result Date: 06/18/2021 CLINICAL DATA:  Status post fall. EXAM: LEFT FOOT - COMPLETE 3+ VIEW COMPARISON:  None Available. FINDINGS: There is no evidence of an acute fracture or dislocation. Mild to moderate severity degenerative changes are seen along the dorsal aspect of the proximal to mid left foot. A superficial soft tissue defect is seen along the anterior aspect of the distal left tibial shaft. IMPRESSION: 1. No acute fracture or dislocation. 2. Mild to moderate severity degenerative changes. 3. Superficial soft tissue defect along the anterior aspect of the distal left tibial shaft. Electronically Signed   By: Virgina Norfolk M.D.   On: 06/18/2021 23:29   DG Foot Complete Right  Result Date: 06/18/2021 CLINICAL DATA:  Status post fall. EXAM: RIGHT FOOT COMPLETE - 3+ VIEW COMPARISON:  None Available. FINDINGS: Small fracture deformities of  indeterminate age are seen along the dorsal aspects of the right talus and  right navicular bone. There is no evidence of dislocation. A small to moderate sized plantar calcaneal spur is seen. Mild dorsal soft tissue swelling is noted. IMPRESSION: 1. Small fracture deformities of indeterminate age along the dorsal aspects of the right talus and right navicular bone. Correlation with physical examination is recommended to determine the presence of point tenderness. 2. Small to moderate-sized plantar calcaneal spur. 3. Mild dorsal soft tissue swelling. Electronically Signed   By: Virgina Norfolk M.D.   On: 06/18/2021 23:35    Review of Systems  HENT:  Negative for ear discharge, ear pain, hearing loss and tinnitus.   Eyes:  Negative for photophobia and pain.  Respiratory:  Negative for cough and shortness of breath.   Cardiovascular:  Negative for chest pain.  Gastrointestinal:  Negative for abdominal pain, nausea and vomiting.  Genitourinary:  Negative for dysuria, flank pain, frequency and urgency.  Musculoskeletal:  Positive for arthralgias (Left knee, right foot). Negative for back pain, myalgias and neck pain.  Neurological:  Negative for dizziness and headaches.  Hematological:  Does not bruise/bleed easily.  Psychiatric/Behavioral:  The patient is not nervous/anxious.   Blood pressure (!) 113/55, pulse 89, temperature 98.2 F (36.8 C), temperature source Oral, resp. rate 19, SpO2 92 %. Physical Exam Constitutional:      General: She is not in acute distress.    Appearance: She is well-developed. She is not diaphoretic.  HENT:     Head: Normocephalic and atraumatic.  Eyes:     General: No scleral icterus.       Right eye: No discharge.        Left eye: No discharge.     Conjunctiva/sclera: Conjunctivae normal.  Cardiovascular:     Rate and Rhythm: Normal rate and regular rhythm.  Pulmonary:     Effort: Pulmonary effort is normal. No respiratory distress.  Musculoskeletal:      Cervical back: Normal range of motion.     Comments: RLE No traumatic wounds, ecchymosis, or rash  Short leg splint in place, mild TTP over dorsal midfoot  No knee effusion  Knee stable to varus/ valgus and anterior/posterior stress  Sens DPN, SPN, TN intact  Motor EHL 5/5  DP 1+, No significant edema  LLE No traumatic wounds, ecchymosis, or rash  Long leg splint in place  Sens DPN, SPN, TN intact  Motor EHL 5/5  DP 1+, No significant edema  Skin:    General: Skin is warm and dry.  Neurological:     Mental Status: She is alert.  Psychiatric:        Mood and Affect: Mood normal.        Behavior: Behavior normal.    Assessment/Plan: Left tibia fx -- Plan IMN today with Dr. Doreatha Martin. Please keep NPO. Right foot fxs -- Unsure if fxs are new but even if so should do well with a CAM boot and WBAT.    Lisette Abu, PA-C Orthopedic Surgery [phone] 06/19/2021, 9:44 AM

## 2021-06-19 NOTE — Anesthesia Preprocedure Evaluation (Addendum)
Anesthesia Evaluation  Patient identified by MRN, date of birth, ID band Patient awake    Reviewed: Allergy & Precautions, NPO status , Patient's Chart, lab work & pertinent test results, reviewed documented beta blocker date and time   Airway Mallampati: II  TM Distance: >3 FB Neck ROM: Full    Dental  (+) Missing, Dental Advisory Given, Caps,    Pulmonary shortness of breath and with exertion, asthma , sleep apnea and Continuous Positive Airway Pressure Ventilation ,    Pulmonary exam normal breath sounds clear to auscultation       Cardiovascular + DVT  Normal cardiovascular exam Rhythm:Regular Rate:Normal     Neuro/Psych  Headaches, PSYCHIATRIC DISORDERS Anxiety Depression Numbness both feet    GI/Hepatic Neg liver ROS, GERD  Medicated and Controlled,  Endo/Other  Morbid obesitySuper MO  Renal/GU Renal InsufficiencyRenal disease  negative genitourinary   Musculoskeletal  (+) Arthritis , Osteoarthritis,  Left proximal tibia Fx   Abdominal (+) + obese,   Peds  Hematology  (+) Blood dyscrasia, , Factor V Leiden def Protein S def Coumadin Therapy- PT- 28.2, INR 2.7 this am   Anesthesia Other Findings   Reproductive/Obstetrics                           Anesthesia Physical Anesthesia Plan  ASA: 3  Anesthesia Plan: General   Post-op Pain Management: Dilaudid IV, Precedex and Ketamine IV*   Induction:   PONV Risk Score and Plan: 3 and Treatment may vary due to age or medical condition, Ondansetron and Dexamethasone  Airway Management Planned: Oral ETT  Additional Equipment:   Intra-op Plan:   Post-operative Plan: Extubation in OR  Informed Consent: I have reviewed the patients History and Physical, chart, labs and discussed the procedure including the risks, benefits and alternatives for the proposed anesthesia with the patient or authorized representative who has indicated his/her  understanding and acceptance.     Dental advisory given  Plan Discussed with: CRNA and Anesthesiologist  Anesthesia Plan Comments:         Anesthesia Quick Evaluation

## 2021-06-19 NOTE — ED Notes (Signed)
Bariatric bed ordered. Ortho tech made aware of splinting. Will try to coordinate transfer to bariatric bed with splinting.

## 2021-06-20 ENCOUNTER — Other Ambulatory Visit: Payer: Self-pay

## 2021-06-20 DIAGNOSIS — S8292XA Unspecified fracture of left lower leg, initial encounter for closed fracture: Secondary | ICD-10-CM | POA: Diagnosis not present

## 2021-06-20 LAB — CBC
HCT: 29.2 % — ABNORMAL LOW (ref 36.0–46.0)
Hemoglobin: 9.6 g/dL — ABNORMAL LOW (ref 12.0–15.0)
MCH: 31.2 pg (ref 26.0–34.0)
MCHC: 32.9 g/dL (ref 30.0–36.0)
MCV: 94.8 fL (ref 80.0–100.0)
Platelets: 182 10*3/uL (ref 150–400)
RBC: 3.08 MIL/uL — ABNORMAL LOW (ref 3.87–5.11)
RDW: 13.2 % (ref 11.5–15.5)
WBC: 9.8 10*3/uL (ref 4.0–10.5)
nRBC: 0 % (ref 0.0–0.2)

## 2021-06-20 LAB — BASIC METABOLIC PANEL
Anion gap: 7 (ref 5–15)
BUN: 10 mg/dL (ref 6–20)
CO2: 26 mmol/L (ref 22–32)
Calcium: 8.6 mg/dL — ABNORMAL LOW (ref 8.9–10.3)
Chloride: 104 mmol/L (ref 98–111)
Creatinine, Ser: 0.62 mg/dL (ref 0.44–1.00)
GFR, Estimated: >60 mL/min (ref >60)
Glucose, Bld: 193 mg/dL — ABNORMAL HIGH (ref 70–99)
Potassium: 3.7 mmol/L (ref 3.5–5.1)
Sodium: 137 mmol/L (ref 135–145)

## 2021-06-20 LAB — HEPARIN LEVEL (UNFRACTIONATED)
Heparin Unfractionated: 0.27 IU/mL — ABNORMAL LOW (ref 0.30–0.70)
Heparin Unfractionated: 0.41 IU/mL (ref 0.30–0.70)

## 2021-06-20 LAB — PROTIME-INR
INR: 1.4 — ABNORMAL HIGH (ref 0.8–1.2)
Prothrombin Time: 16.7 seconds — ABNORMAL HIGH (ref 11.4–15.2)

## 2021-06-20 MED ORDER — WARFARIN - PHARMACIST DOSING INPATIENT
Freq: Every day | Status: DC
  Administered 2021-06-22: 1

## 2021-06-20 MED ORDER — HEPARIN (PORCINE) 25000 UT/250ML-% IV SOLN
1800.0000 [IU]/h | INTRAVENOUS | Status: DC
  Administered 2021-06-20: 1800 [IU]/h via INTRAVENOUS
  Administered 2021-06-20: 1500 [IU]/h via INTRAVENOUS
  Administered 2021-06-21 – 2021-06-22 (×2): 1800 [IU]/h via INTRAVENOUS
  Filled 2021-06-20 (×4): qty 250

## 2021-06-20 MED ORDER — MELATONIN 3 MG PO TABS
3.0000 mg | ORAL_TABLET | Freq: Once | ORAL | Status: AC
  Administered 2021-06-20: 3 mg via ORAL
  Filled 2021-06-20: qty 1

## 2021-06-20 MED ORDER — WARFARIN SODIUM 7.5 MG PO TABS
7.5000 mg | ORAL_TABLET | Freq: Once | ORAL | Status: AC
Start: 2021-06-20 — End: 2021-06-20
  Administered 2021-06-20: 7.5 mg via ORAL
  Filled 2021-06-20: qty 1

## 2021-06-20 MED ORDER — LOPERAMIDE HCL 2 MG PO CAPS
2.0000 mg | ORAL_CAPSULE | ORAL | Status: DC | PRN
  Administered 2021-06-20: 2 mg via ORAL
  Filled 2021-06-20: qty 1

## 2021-06-20 NOTE — Progress Notes (Signed)
[place] for Warfarin and Heparin (until INR is therapeutic) Indication: history of DVT with factor V Leiden and protein S deficiency, atrial fibrillation  Allergies  Allergen Reactions   Cephalexin Other (See Comments)    unknown unknown    Codeine Nausea And Vomiting   Diphenhydramine Hcl Itching   Ranitidine Rash    Patient Measurements: Height: '5\' 2"'$  (157.5 cm) Weight: (!) 176.9 kg (390 lb) IBW/kg (Calculated) : 50.1 HEPARIN DW (KG): 105 kg   Vital Signs: Temp: 99 F (37.2 C) (06/03 0703) Temp Source: Oral (06/03 0703) BP: 111/54 (06/03 0703) Pulse Rate: 64 (06/03 0703)  Labs: Recent Labs    06/18/21 2227 06/19/21 0327 06/19/21 1828 06/20/21 0306 06/20/21 1216  HGB 12.8 13.4  --  9.6*  --   HCT 41.1 43.0  --  29.2*  --   PLT 246 236  --  182  --   LABPROT 29.5* 28.2* 19.6* 16.7*  --   INR 2.8* 2.7* 1.7* 1.4*  --   HEPARINUNFRC  --   --   --   --  0.27*  CREATININE 1.11*  --   --  0.62  --      Estimated Creatinine Clearance: 129.4 mL/min (by C-G formula based on SCr of 0.62 mg/dL).   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 ([place]) 10/27/2015   Dizziness 02/18/2015   Edema of extremities 02/18/2015   Elevated blood pressure reading 03/20/2015   Encounter for screening mammogram for breast cancer 10/27/2015   Factor V Leiden mutation ([place]) 10/27/2015   Gastroesophageal reflux disease 02/18/2015   Generalized anxiety disorder 02/18/2015   High risk medication use 07/25/2015   History of DVT (deep vein thrombosis) 03/15/2018   Hypokalemia 05/16/2015   Impaired fasting glucose 02/18/2015   Insomnia 02/18/2015   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   Lesion of ovary 02/18/2015    Lipoma 02/18/2015   Long term current use of anticoagulant therapy 03/20/2015   Migraine without aura and without status migrainosus, not intractable 06/25/2015   Moderate episode of recurrent major depressive disorder ([place]) 08/02/2018   Obesity, morbid, BMI 50 or higher ([place]) 02/18/2015   OSA (obstructive sleep apnea) 02/18/2015   Osteoarthritis 02/18/2015   Primary osteoarthritis of both knees 08/30/2017   Primary osteoarthritis of left knee 04/22/2015   Protein S deficiency ([place]) 02/18/2015   Shortness of breath 02/18/2015   Tachycardia 07/01/2015   Tachycardia 07/01/2015    Medications:  Medications Prior to Admission  Medication Sig Dispense Refill Last Dose   albuterol (PROVENTIL HFA;VENTOLIN HFA) 108 (90 BASE) MCG/ACT inhaler Inhale 1 puff into the lungs every 6 (six) hours as needed for wheezing or shortness of breath.   unknown   augmented betamethasone dipropionate (DIPROLENE-AF) 0.05 % cream Apply 1 application. topically 2 (two) times daily.   06/17/2021   BELSOMRA 10 MG TABS Take 1 tablet by mouth at bedtime as needed.   unknown   budesonide-formoterol (SYMBICORT) 160-4.5 MCG/ACT inhaler Inhale 2 puffs into the lungs daily.   unknown   calcipotriene (DOVONOX) 0.005 % cream Apply 1 application. topically 2 (two) times daily.   06/17/2021   cetirizine (ZYRTEC) 10 MG tablet Take 1 tablet by mouth daily.  06/17/2021   cyclobenzaprine (FLEXERIL) 5 MG tablet Take 5 mg by mouth 3 (three) times daily as needed for muscle spasms.   06/17/2021   diclofenac Sodium (VOLTAREN) 1 % GEL Apply 2-3 g topically as needed for pain.   06/17/2021   furosemide (LASIX) 40 MG tablet Take 40 mg by mouth daily.   06/17/2021   montelukast (SINGULAIR) 10 MG tablet Take 1 tablet by mouth daily.   06/17/2021   oxyCODONE-acetaminophen (PERCOCET/ROXICET) 5-325 MG tablet Take by mouth.   06/18/2021   potassium chloride (K-DUR) 10 MEQ tablet Take 10 mEq by mouth 2 (two) times daily.   06/17/2021   SUMAtriptan (IMITREX) 100 MG  tablet Take 1 tablet by mouth as needed for migraine.   06/16/2021   traMADol (ULTRAM) 50 MG tablet Take 1 tablet by mouth as needed for pain.   06/18/2021   venlafaxine XR (EFFEXOR-XR) 75 MG 24 hr capsule Take 1 capsule by mouth daily.   06/17/2021   warfarin (COUMADIN) 5 MG tablet Take 1 1/2 tablets daily or as directed by Coumadin Clinic. PLEASE KEEP UPCOMING CARDIOLOGY APPT FOR FUTURE REFILLS. 50 tablet 0 06/18/2021 at 4:30pm    Assessment: Pt on warfarin PTA for history of DVT with factor V Leiden and protein S deficiency, and atrial fibrillation. Admission INR 2.7. Reversed with vit K '5mg'$  IV 6/2 in anticipation of OR for IM nailing. CBC ok on admission.  Home dose: 7.'5mg'$  daily - last 6/1  Pt now s/p OR and continues on heparin bridge until therapeutic INR with warfarin.  Heparin level 0.27, slightly subtherapeutic, on 1500 units/hr.   Goal of Therapy:  INR 2-3 Heparin level goal 0.3-0.7 Monitor platelets by anticoagulation protocol: Yes   Plan:  Increase heparin to 1800 units/hr Recheck heparin level in 6 hours. Warfarin 7.'5mg'$  PO x 1 tonight (as previously ordered)  [employer], Pharm.D., BCPS Clinical Pharmacist Clinical phone for 06/20/2021 from 7:30-3:00 is [phone].  **Pharmacist phone directory can be found on [email or web address] listed under [place].  06/20/2021 1:36 PM

## 2021-06-20 NOTE — Progress Notes (Signed)
Orthopaedic Trauma Service Progress Note  Patient ID: Beth Marshall MRN: [id number] DOB/AGE: Jul 06, 1965 56 y.o.  Subjective:  Left leg sore but tolerable No other ortho complaints   INR 1.4 this am   On heparin bridge to coumadin   ROS As above  Objective:   VITALS:   Vitals:   06/19/21 1519 06/19/21 2006 06/19/21 2035 06/20/21 0703  BP: 115/79  109/74 (!) 111/54  Pulse: 100  88 64  Resp: '17  20 12  '$ Temp: 97.9 F (36.6 C)  97.9 F (36.6 C) 99 F (37.2 C)  TempSrc:   Oral Oral  SpO2: 95% 94% 96% 97%  Weight:      Height: '5\' 2"'$  (1.575 m)       Estimated body mass index is 71.33 kg/m as calculated from the following:   Height as of this encounter: '5\' 2"'$  (1.575 m).   Weight as of this encounter: 176.9 kg.   Intake/Output      06/02 0701 06/03 0700 06/03 0701 06/04 0700   P.O. 600    I.V. (mL/kg) 2042.8 (11.5)    IV Piggyback 200    Total Intake(mL/kg) 2842.8 (16.1)    Urine (mL/kg/hr) 1425 (0.3)    Blood 50    Total Output 1475    Net +1367.8           LABS  Results for orders placed or performed during the hospital encounter of 06/18/21 (from the past 24 hour(s))  Protime-INR     Status: Abnormal   Collection Time: 06/19/21  6:28 PM  Result Value Ref Range   Prothrombin Time 19.6 (H) 11.4 - 15.2 seconds   INR 1.7 (H) 0.8 - 1.2  Protime-INR     Status: Abnormal   Collection Time: 06/20/21  3:06 AM  Result Value Ref Range   Prothrombin Time 16.7 (H) 11.4 - 15.2 seconds   INR 1.4 (H) 0.8 - 1.2  Basic metabolic panel     Status: Abnormal   Collection Time: 06/20/21  3:06 AM  Result Value Ref Range   Sodium 137 135 - 145 mmol/L   Potassium 3.7 3.5 - 5.1 mmol/L   Chloride 104 98 - 111 mmol/L   CO2 26 22 - 32 mmol/L   Glucose, Bld 193 (H) 70 - 99 mg/dL   BUN 10 6 - 20 mg/dL   Creatinine, Ser 0.62 0.44 - 1.00 mg/dL   Calcium 8.6 (L) 8.9 - 10.3 mg/dL   GFR, Estimated >60  >60 mL/min   Anion gap 7 5 - 15  CBC     Status: Abnormal   Collection Time: 06/20/21  3:06 AM  Result Value Ref Range   WBC 9.8 4.0 - 10.5 K/uL   RBC 3.08 (L) 3.87 - 5.11 MIL/uL   Hemoglobin 9.6 (L) 12.0 - 15.0 g/dL   HCT 29.2 (L) 36.0 - 46.0 %   MCV 94.8 80.0 - 100.0 fL   MCH 31.2 26.0 - 34.0 pg   MCHC 32.9 30.0 - 36.0 g/dL   RDW 13.2 11.5 - 15.5 %   Platelets 182 150 - 400 K/uL   nRBC 0.0 0.0 - 0.2 %     PHYSICAL EXAM:   Gen: awake and alert, pleasant. Morbidly obese. Air mattress in place  Lungs: unlabored Ext:  Left Lower Extremity   Dressing clean, dry and intact  Ext warm   Good perfusion distally   + DP pulse  DPN, SPN, TN sensory functions intact  EHL, FHL, lesser toe motor functions intact  Ankle flexion, extension, inversion eversion intact  Compartments are soft.  No pain out of proportion with passive stretching  Assessment/Plan: 1 Day Post-Op   Principal Problem:   Fracture of lower extremity Active Problems:   History of DVT (deep vein thrombosis)   Factor V Leiden mutation (HCC)   Obesity, morbid, BMI 50 or higher ([place])   Protein S deficiency ([place])   Atrial fibrillation (HCC)   Ankle fracture   Tibia/fibula fracture, left, closed, initial encounter   Anti-infectives (From admission, onward)    Start     Dose/Rate Route Frequency Ordered Stop   06/20/21 0000  vancomycin (VANCOCIN) IVPB 1000 mg/200 mL premix        1,000 mg 200 mL/hr over 60 Minutes Intravenous Every 12 hours 06/19/21 1513 06/20/21 0149   06/19/21 1336  vancomycin (VANCOCIN) powder  Status:  Discontinued          As needed 06/19/21 1337 06/19/21 1410   06/19/21 1130  vancomycin (VANCOREADY) IVPB 1500 mg/300 mL        1,500 mg 150 mL/hr over 120 Minutes Intravenous To [facility] 06/19/21 1112 06/19/21 1239     .  POD/HD#: 56  56 year old female with numerous chronic medical comorbidities s/p fall with left proximal tibia and fibula fracture  -ground level  fall   Morbid obesity   -Closed left proximal tibia and fibula fracture s/p IMN left tibia  Touchdown weightbearing left leg  Unrestricted motion of ankle and knee  Therapy evaluations  SW consult for SNF    Given patient's size I do not feel that she will be able to mobilize easily and will likely need skilled nursing center at discharge   Dressing changes as needed starting on 06/21/2021   Ice and elevate for swelling and pain control  -Avulsion fractures right talus and navicular  Nonoperative treatment.  No bracing necessary  Weight-Marshall as tolerated  Symptomatic management  - Pain management:  Multimodal - ABL anemia/Hemodynamics  Stable  Monitor - Medical issues   Per primary - DVT/PE prophylaxis:  On chronic anticoagulation  Currently heparin bridge to Coumadin given subtherapeutic INR - ID:   Perioperative antibiotics  - Metabolic Bone Disease:  Check vitamin d levels  Given low energy fracture recommend DEXA in next month or 2  - Activity:  As above  - Impediments to fracture healing:  Obesity   Increased fall risk    - Dispo:  Therapy evals  Ortho issues stable     Jari Pigg, PA-C [phone] (C) 06/20/2021, 11:24 AM  [facility] [place] [address] [phone] Jenetta [phone] (F)    After 5pm and on the weekends please log on to Amion, go to orthopaedics and the look under the [facility] Call for the provider(s) on call. You can also call our office at [phone] and then follow the prompts to be connected to the call team.   Patient ID: Beth Marshall, female   DOB: 05-10-1965, 56 y.o.   MRN: [id number]

## 2021-06-20 NOTE — Evaluation (Signed)
Occupational Therapy Evaluation Patient Details Name: Beth Marshall MRN: [id number] DOB: 07/13/1965 Today's Date: 06/20/2021   History of Present Illness pt is 56 y.o. 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 pt found with L proximal tibia fracture, now POD 1 IMN with TDWB to LLE   Clinical Impression   Pt resides at home alone in apartment, aide comes to assist 5 days a week with IADLs and BADL's, pt using RW/rollator for mobility with increased frequency of falls recently. Pt with excellent participation and performance during OT evaluation, seen with PT services to maximize safety of pt and therapist. Pt does present with decreased activity tolerance, balance while sitting, mild discomfort with repositioning and decreased strength leading to need for +2 mod-max for supine<>sitting, mod +1 with cues for technique for rolling, and demo's min-mod UB ADLs, and max-dep at bed level LB/toileting tasks. Bariatric bed limiting some performance and participation. At this level pt is unsafe for d/c to return to home, would benefit significantly from post acute daily services with OT to continue to follow acutely.      Recommendations for follow up therapy are one component of a multi-disciplinary discharge planning process, led by the attending physician.  Recommendations may be updated based on patient status, additional functional criteria and insurance authorization.   Follow Up Recommendations  Skilled nursing-short term rehab (<3 hours/day)    Assistance Recommended at Discharge Frequent or constant Supervision/Assistance  Patient can return home with the following A lot of help with bathing/dressing/bathroom    Functional Status Assessment  Patient has had a recent decline in their functional status and  demonstrates the ability to make significant improvements in function in a reasonable and predictable amount of time.  Equipment Recommendations  BSC/3in1 (TBD)    Recommendations for Other Services       Precautions / Restrictions Precautions Precautions: Fall Restrictions Weight Bearing Restrictions: Yes RLE Weight Bearing: Weight bearing as tolerated LLE Weight Bearing: Touchdown weight bearing      Mobility Bed Mobility Overal bed mobility: Needs Assistance Bed Mobility: Rolling, Supine to Sit, Sit to Supine Rolling: Mod assist   Supine to sit: Max assist, +2 for physical assistance Sit to supine: Max assist, +2 for physical assistance   General bed mobility comments: good sustained sidelying, good effort and use of body mechanics    Transfers                   General transfer comment: deferred transfers out of bed this date      Balance Overall balance assessment: Mild deficits observed, not formally tested                                         ADL either performed or assessed with clinical judgement   ADL Overall ADL's : Needs assistance/impaired Eating/Feeding: Set up   Grooming: Set up;Bed level           Upper Body Dressing : Minimal assistance;Bed level   Lower Body Dressing: Bed level;Total assistance               Functional mobility during ADLs: +2 for physical assistance;Maximal assistance General ADL Comments: pt demos need for at least set up-min A for  UB ADLs, max-dep for LB and toileting at bed level and EOB.     Vision         Perception     Praxis      Pertinent Vitals/Pain Pain Assessment Pain Assessment: No/denies pain     Hand Dominance Right   Extremity/Trunk Assessment Upper Extremity Assessment Upper Extremity Assessment: Overall WFL for tasks assessed       Cervical / Trunk Assessment Cervical / Trunk Assessment: Normal   Communication Communication Communication: No  difficulties   Cognition Arousal/Alertness: Awake/alert Behavior During Therapy: WFL for tasks assessed/performed Overall Cognitive Status: Within Functional Limits for tasks assessed                                 General Comments: appears to be at baseline Ox4, overall good sfaety. needing frequent redirection to task.     General Comments       Exercises     Shoulder Instructions      Home Living Family/patient expects to be discharged to:: Private residence Living Arrangements: Alone Available Help at Discharge: Personal care attendant Type of Home: Apartment Home Access: Level entry     Home Layout: One level     Bathroom Shower/Tub: Astronomer Accessibility: Yes How Accessible: Accessible via walker Home Equipment: Todd (2 wheels)          Prior Functioning/Environment Prior Level of Function : Needs assist             Mobility Comments: use of RW/or rollator ADLs Comments: pt requiring A for bathing, meals, dressing and IADl tasks from aide available from 10-1 5 days/week        OT Problem List: Decreased activity tolerance;Impaired balance (sitting and/or standing);Decreased safety awareness;Decreased knowledge of use of DME or AE;Decreased knowledge of precautions      OT Treatment/Interventions: Self-care/ADL training;Therapeutic exercise;DME and/or AE instruction;Therapeutic activities;Patient/family education    OT Goals(Current goals can be found in the care plan section) Acute Rehab OT Goals Patient Stated Goal: to be able to get out of bed OT Goal Formulation: With patient Time For Goal Achievement: 07/04/21 Potential to Achieve Goals: Good ADL Goals Pt Will Perform Grooming: with set-up;sitting Pt/caregiver will Perform Home Exercise Program: Both right and left upper extremity;Increased strength;With written HEP provided;With theraband Additional ADL Goal #1: pt will complete bed mobility with  Sup in prep for participation with completion of functional transfers and ADLs  OT Frequency: Min 2X/week    Co-evaluation PT/OT/SLP Co-Evaluation/Treatment: Yes Reason for Co-Treatment: Complexity of the patient's impairments (multi-system involvement);For patient/therapist safety;To address functional/ADL transfers   OT goals addressed during session: ADL's and self-care      AM-PAC OT "6 Clicks" Daily Activity     Outcome Measure Help from another person eating meals?: None Help from another person taking care of personal grooming?: A Little Help from another person toileting, which includes using toliet, bedpan, or urinal?: Total Help from another person bathing (including washing, rinsing, drying)?: A Lot Help from another person to put on and taking off regular upper body clothing?: A Little Help from another person to put on and taking off regular lower body clothing?: Total 6 Click Score: 14   End of Session    Activity Tolerance: Patient tolerated treatment well Patient left: in bed;with call bell/phone within reach;with nursing/sitter in room  OT Visit Diagnosis: Other abnormalities of gait and  mobility (R26.89);History of falling (Z91.81);Repeated falls (R29.6);Muscle weakness (generalized) (M62.81)                Time: 1431-1500 OT Time Calculation (min): 29 min Charges:  OT General Charges $OT Visit: 1 Visit OT Evaluation $OT Eval Moderate Complexity: 1 Mod  Levon Penning OTR/L acute rehab services Office: [phone]  Toula Moos Santo Zahradnik 06/20/2021, 3:00 PM

## 2021-06-20 NOTE — Progress Notes (Signed)
B[facility] for Warfarin and Heparin (until INR is therapeutic) Indication: history of DVT with factor V Leiden and protein S deficiency, atrial fibrillation  Allergies  Allergen Reactions   Cephalexin Other (See Comments)    unknown unknown    Codeine Nausea And Vomiting   Diphenhydramine Hcl Itching   Ranitidine Rash    Patient Measurements: Height: '5\' 2"'$  (157.5 cm) Weight: (!) 176.9 kg (390 lb) IBW/kg (Calculated) : 50.1 HEPARIN DW (KG): 105 kg   Vital Signs: Temp: 98.3 F (36.8 C) (06/03 1918) Temp Source: Oral (06/03 1918) BP: 106/60 (06/03 1918) Pulse Rate: 98 (06/03 1918)  Labs: Recent Labs    06/18/21 2227 06/19/21 0327 06/19/21 1828 06/20/21 0306 06/20/21 1216 06/20/21 2007  HGB 12.8 13.4  --  9.6*  --   --   HCT 41.1 43.0  --  29.2*  --   --   PLT 246 236  --  182  --   --   LABPROT 29.5* 28.2* 19.6* 16.7*  --   --   INR 2.8* 2.7* 1.7* 1.4*  --   --   HEPARINUNFRC  --   --   --   --  0.27* 0.41  CREATININE 1.11*  --   --  0.62  --   --      Estimated Creatinine Clearance: 129.4 mL/min (by C-G formula based on SCr of 0.62 mg/dL).   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 ([place]) 10/27/2015   Dizziness 02/18/2015   Edema of extremities 02/18/2015   Elevated blood pressure reading 03/20/2015   Encounter for screening mammogram for breast cancer 10/27/2015   Factor V Leiden mutation ([place]) 10/27/2015   Gastroesophageal reflux disease 02/18/2015   Generalized anxiety disorder 02/18/2015   High risk medication use 07/25/2015   History of DVT (deep vein thrombosis) 03/15/2018   Hypokalemia 05/16/2015   Impaired fasting glucose 02/18/2015   Insomnia 02/18/2015    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   Lesion of ovary 02/18/2015   Lipoma 02/18/2015   Long term current use of anticoagulant therapy 03/20/2015   Migraine without aura and without status migrainosus, not intractable 06/25/2015   Moderate episode of recurrent major depressive disorder ([place]) 08/02/2018   Obesity, morbid, BMI 50 or higher ([place]) 02/18/2015   OSA (obstructive sleep apnea) 02/18/2015   Osteoarthritis 02/18/2015   Primary osteoarthritis of both knees 08/30/2017   Primary osteoarthritis of left knee 04/22/2015   Protein S deficiency ([place]) 02/18/2015   Shortness of breath 02/18/2015   Tachycardia 07/01/2015   Tachycardia 07/01/2015    Medications:  Medications Prior to Admission  Medication Sig Dispense Refill Last Dose   albuterol (PROVENTIL HFA;VENTOLIN HFA) 108 (90 BASE) MCG/ACT inhaler Inhale 1 puff into the lungs every 6 (six) hours as needed for wheezing or shortness of breath.   unknown   augmented betamethasone dipropionate (DIPROLENE-AF) 0.05 % cream Apply 1 application. topically 2 (two) times daily.   06/17/2021   BELSOMRA 10 MG TABS Take 1 tablet by mouth at bedtime as needed.   unknown   budesonide-formoterol (SYMBICORT) 160-4.5 MCG/ACT inhaler Inhale 2 puffs into the lungs daily.   unknown   calcipotriene (DOVONOX) 0.005 % cream Apply 1 application.  topically 2 (two) times daily.   06/17/2021   cetirizine (ZYRTEC) 10 MG tablet Take 1 tablet by mouth daily.   06/17/2021   cyclobenzaprine (FLEXERIL) 5 MG tablet Take 5 mg by mouth 3 (three) times daily as needed for muscle spasms.   06/17/2021   diclofenac Sodium (VOLTAREN) 1 % GEL Apply 2-3 g topically as needed for pain.   06/17/2021   furosemide (LASIX) 40 MG tablet Take 40 mg by mouth daily.   06/17/2021   montelukast (SINGULAIR) 10 MG tablet Take 1 tablet by mouth daily.   06/17/2021   oxyCODONE-acetaminophen (PERCOCET/ROXICET) 5-325 MG tablet Take by mouth.   06/18/2021   potassium chloride (K-DUR) 10 MEQ tablet Take 10 mEq by mouth 2 (two)  times daily.   06/17/2021   SUMAtriptan (IMITREX) 100 MG tablet Take 1 tablet by mouth as needed for migraine.   06/16/2021   traMADol (ULTRAM) 50 MG tablet Take 1 tablet by mouth as needed for pain.   06/18/2021   venlafaxine XR (EFFEXOR-XR) 75 MG 24 hr capsule Take 1 capsule by mouth daily.   06/17/2021   warfarin (COUMADIN) 5 MG tablet Take 1 1/2 tablets daily or as directed by Coumadin Clinic. PLEASE KEEP UPCOMING CARDIOLOGY APPT FOR FUTURE REFILLS. 50 tablet 0 06/18/2021 at 4:30pm    Assessment: Pt on warfarin PTA for history of DVT with factor V Leiden and protein S deficiency, and atrial fibrillation. Admission INR 2.7. Reversed with vit K '5mg'$  IV 6/2 in anticipation of OR for IM nailing. CBC ok on admission.  Home dose: 7.'5mg'$  daily - last 6/1  Pt now s/p OR and continues on heparin bridge until therapeutic INR with warfarin. Repeat heparin level tonight is therapeutic at 0.41.   Goal of Therapy:  INR 2-3 Heparin level goal 0.3-0.7 Monitor platelets by anticoagulation protocol: Yes   Plan:  Continue heparin 1800 units/h Daily heparin level and CBC  Arrie Senate, PharmD, [place], [facility] Clinical Pharmacist [phone] Please check AMION for all [facility] Pharmacy numbers 06/20/2021

## 2021-06-20 NOTE — Progress Notes (Signed)
PROGRESS NOTE Beth Marshall  HQI:[id number] DOB: 1965/08/19 DOA: 06/18/2021 PCP: Madison Hickman, FNP   Brief Narrative/Hospital Course: 56 y.o. 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 She lives alone and for the past week she has noticed increasing left foot pain.  Pain became so severe about 2 days PTA that she could barely walk.  She got up out of bed and fell.  She presented to [facility] but reportedly had no significant findings.  Then earlier on 06/18/21  she had difficulty getting up from the toilet and then fell back onto the commode when she tried to stand up.  She then called EMS and after they stood her up she fell again and heard her knee pop. At baseline she requires assistant with a walker and has chronic leg pain. In the ED, she was found to have fractures in her left lateral, medial malleolus, comminuted displaced fracture of the left tibial shaft.  She also had avulsion fracture of the right navicular. ED PA discussed with orthopedic and admitted.    Subjective: Seen and examined this morning.  Resting comfortably has no new complaints, pain is controlled, requesting for Imodium which she takes at home PRN  Assessment and Plan: Principal Problem:   Fracture of lower extremity Active Problems:   History of DVT (deep vein thrombosis)   Factor V Leiden mutation (HCC)   Obesity, morbid, BMI 50 or higher ([place])   Protein S deficiency ([place])   Atrial fibrillation ([place])   Ankle fracture   Tibia/fibula fracture, left, closed, initial encounter   Fracture of lower extremity-left tibia: s/p IM nailing of left proximal tibia Dr Doreatha Martin 6/2.  Continue pain control, PT OT dressing DVT prophylaxis as per orthopedics.  Acute blood loss anemia in the setting of fracture/coagulopathy due to Coumadin,  postoperative anemia due to expected blood loss: Recent Labs  Lab 06/18/21 2227 06/19/21 0327 06/20/21 0306  HGB 12.8 13.4 9.6*  HCT 41.1 43.0 29.2*     Right foot fracture unsure if new or old, advised cam boot WBAT   History of DVT Factor V Leiden mutation Protein S deficiency Paroxysmal atrial fibrillation- in NSR now:  Patient received vitamin For reversal preoperatively. INR yesterday 1.7 and early this am 1.4-  started on heparin bridge given high risk of VTE-multiple indication for anticoagulation with VTE hx/ factor V/ protein S deficiency and A-fib history-pharmacy monitoring and dosing Coumadin, will monitor hemoglobin closely.  I discussed with Dr Doreatha Martin this morning re continuing with heparin bridge and Coumadin.  Patient also understands and is agreeable.  Obesity, morbid, BMI  71-currently trying to lose weight per her OSA intolerant to CPAP.   DVT prophylaxis: SCDs Start: 06/19/21 1514 Code Status:   Code Status: Full Code Family Communication: plan of care discussed with patient at bedside. Patient status is: Inpatient because of ongoing management of post tibia fracture Level of care: Telemetry Medical   Dispo: The patient is from: HOME            Anticipated disposition: Anticipating skilled nursing facility placement  Mobility Assessment (last 72 hours)     Mobility Assessment     Row Name 06/19/21 1545           Does patient have an order for bedrest or is patient medically unstable No -  Continue assessment       What is the highest level of mobility based on the progressive mobility assessment? Level 2 ([place]) - Balance while sitting on edge of bed and cannot stand       Is the above level different from baseline mobility prior to current illness? Yes - Recommend PT order                 Objective: Vitals last 24 hrs: Vitals:   06/19/21 1519 06/19/21 2006 06/19/21 2035 06/20/21 0703  BP: 115/79  109/74 (!) 111/54  Pulse: 100  88 64  Resp:  '17  20 12  '$ Temp: 97.9 F (36.6 C)  97.9 F (36.6 C) 99 F (37.2 C)  TempSrc:   Oral Oral  SpO2: 95% 94% 96% 97%  Weight:      Height: '5\' 2"'$  (1.575 m)      Weight change:   Physical Examination: General exam: alert awake,older than stated age, weak appearing. HEENT:Oral mucosa moist, Ear/Nose WNL grossly, dentition normal. Respiratory system: bilaterally diminished BS, no use of accessory muscle Cardiovascular system: S1 & S2 +, No JVD. Gastrointestinal system: Abdomen soft,NT,ND, BS+ Nervous System:Alert, awake, moving extremities and grossly nonfocal Extremities: left leg in dressing/bandage,distal peripheral pulses palpable.  Skin: No rashes,no icterus. MSK: Normal muscle bulk,tone, power  Medications reviewed:  Scheduled Meds:  calcipotriene  1 application. Topical BID   docusate sodium  100 mg Oral BID   furosemide  40 mg Oral Daily   loratadine  10 mg Oral Daily   mometasone-formoterol  2 puff Inhalation BID   montelukast  10 mg Oral Daily   potassium chloride  10 mEq Oral BID   traMADol  50 mg Oral Q6H   triamcinolone cream   Topical BID   venlafaxine XR  75 mg Oral Daily   warfarin  7.5 mg Oral ONCE-1600   Warfarin - Pharmacist Dosing Inpatient   Does not apply q1600   Continuous Infusions:  sodium chloride 75 mL/hr at 06/19/21 1638   heparin 1,500 Units/hr (06/20/21 0505)    Diet Order             Diet Heart Room service appropriate? Yes; Fluid consistency: Thin  Diet effective now                  Intake/Output Summary (Last 24 hours) at 06/20/2021 1003 Last data filed at 06/20/2021 0516 Gross per 24 hour  Intake 2842.82 ml  Output 1475 ml  Net 1367.82 ml   Net IO Since Admission: 1,367.82 mL [06/20/21 1003]  Wt Readings from Last 3 Encounters:  06/19/21 (!) 176.9 kg  05/07/14 (!) 181.4 kg     Unresulted Labs (From admission, onward)     Start     Ordered   06/20/21 1200  Heparin level (unfractionated)  Once-Timed,   TIMED       Question:   Specimen collection method  Answer:  Lab=Lab collect   06/20/21 0409   06/20/21 0300  Basic metabolic panel  Daily,   R      06/19/21 1513   06/20/21 0500  CBC  Daily,   R      06/19/21 1513   06/19/21 0032  Protime-INR  Daily,   R      06/19/21 0033          Data Reviewed: I have personally reviewed following labs and imaging studies CBC: Recent Labs  Lab 06/18/21 2227 06/19/21 0327 06/20/21 0306  WBC  10.2 11.6* 9.8  NEUTROABS 7.0  --   --   HGB 12.8 13.4 9.6*  HCT 41.1 43.0 29.2*  MCV 98.3 98.2 94.8  PLT 246 236 009   Basic Metabolic Panel: Recent Labs  Lab 06/18/21 2227 06/20/21 0306  NA 136 137  K 4.5 3.7  CL 103 104  CO2 23 26  GLUCOSE 151* 193*  BUN 12 10  CREATININE 1.11* 0.62  CALCIUM 9.2 8.6*   GFR: Estimated Creatinine Clearance: 129.4 mL/min (by C-G formula based on SCr of 0.62 mg/dL). Liver Function Tests: Recent Labs  Lab 06/18/21 2227  AST 33  ALT 20  ALKPHOS 85  BILITOT 1.2  PROT 6.7  ALBUMIN 3.7   No results for input(s): LIPASE, AMYLASE in the last 168 hours. No results for input(s): AMMONIA in the last 168 hours. Coagulation Profile: Recent Labs  Lab 06/18/21 2227 06/19/21 0327 06/19/21 1828 06/20/21 0306  INR 2.8* 2.7* 1.7* 1.4*   BNP (last 3 results) No results for input(s): PROBNP in the last 8760 hours. HbA1C: No results for input(s): HGBA1C in the last 72 hours. CBG: No results for input(s): GLUCAP in the last 168 hours. Lipid Profile: No results for input(s): CHOL, HDL, LDLCALC, TRIG, CHOLHDL, LDLDIRECT in the last 72 hours. Thyroid Function Tests: No results for input(s): TSH, T4TOTAL, FREET4, T3FREE, THYROIDAB in the last 72 hours. Sepsis Labs: No results for input(s): PROCALCITON, LATICACIDVEN in the last 168 hours.  Recent Results (from the past 240 hour(s))  Surgical pcr screen     Status: None   Collection Time: 06/19/21 11:21 AM   Specimen: Nasal Mucosa; Nasal Swab  Result Value Ref Range Status   MRSA,  PCR NEGATIVE NEGATIVE Final   Staphylococcus aureus NEGATIVE NEGATIVE Final    Comment: (NOTE) The Xpert SA Assay (FDA approved for NASAL specimens in patients 54 years of age and older), is one component of a comprehensive surveillance program. It is not intended to diagnose infection nor to guide or monitor treatment. Performed at [facility], [place] [address], [place], [place] [address]     Antimicrobials: Anti-infectives (From admission, onward)    Start     Dose/Rate Route Frequency Ordered Stop   06/20/21 0000  vancomycin (VANCOCIN) IVPB 1000 mg/200 mL premix        1,000 mg 200 mL/hr over 60 Minutes Intravenous Every 12 hours 06/19/21 1513 06/20/21 0149   06/19/21 1336  vancomycin (VANCOCIN) powder  Status:  Discontinued          As needed 06/19/21 1337 06/19/21 1410   06/19/21 1130  vancomycin (VANCOREADY) IVPB 1500 mg/300 mL        1,500 mg 150 mL/hr over 120 Minutes Intravenous To ShortStay Surgical 06/19/21 1112 06/19/21 1239      Culture/Microbiology No results found for: SDES, SPECREQUEST, CULT, REPTSTATUS  Other culture-see note  Radiology Studies: DG Tibia/Fibula Left  Result Date: 06/19/2021 CLINICAL DATA:  Left tibial nail intraoperative fluoroscopy. EXAM: LEFT TIBIA AND FIBULA - 2 VIEW COMPARISON:  Left knee and left ankle radiographs 06/18/2021 FINDINGS: Images were performed intraoperatively without the presence of a radiologist. The patient is undergoing left tibial long stem intramedullary nail fixation spanning the comminuted and displaced proximal tibial metadiaphyseal fracture. There is improved anatomic alignment with persistent fracture line lucency. Redemonstration of comminuted and mildly displaced proximal fibular diaphyseal fracture. Redemonstration of chronic fracture deformity of the distal fibula. Total fluoroscopy images: 8 Total fluoroscopy time: 156 seconds Total dose: Radiation Exposure  Index (as provided by the fluoroscopic device): 17.71 mGy  air Kerma Please see intraoperative findings for further detail. IMPRESSION: Intraoperative fluoroscopy for left tibial ORIF. Electronically Signed   By: Yvonne Kendall M.D.   On: 06/19/2021 14:04   DG Tibia/Fibula Left  Result Date: 06/18/2021 CLINICAL DATA:  Status post fall. EXAM: LEFT TIBIA AND FIBULA - 2 VIEW COMPARISON:  None Available. FINDINGS: Acute fracture deformities are seen involving the proximal shafts of the left tibia and left fibula. Medial angulation of the distal fracture sites is seen. There is no evidence of dislocation. Chronic fracture deformities are noted along the visualized portion of the left medial malleolus and left lateral malleolus. Soft tissue swelling is seen at the level of the previously noted acute fractures. IMPRESSION: Acute fracture of the proximal shafts of the left tibia and left fibula. Electronically Signed   By: Virgina Norfolk M.D.   On: 06/18/2021 23:31   DG Ankle Complete Left  Addendum Date: 06/18/2021   ADDENDUM REPORT: 06/18/2021 23:38 ADDENDUM: A superficial soft tissue defect is seen along the anterior aspect of the left ankle at the level of the distal left tibial shaft. Electronically Signed   By: Virgina Norfolk M.D.   On: 06/18/2021 23:38   Result Date: 06/18/2021 CLINICAL DATA:  Status post fall. EXAM: LEFT ANKLE COMPLETE - 3+ VIEW COMPARISON:  None Available. FINDINGS: There is no evidence of an acute fracture, dislocation, or joint effusion. Chronic fracture deformities are seen involving the left lateral malleolus and left medial malleolus. Degenerative changes are seen along the dorsal aspect of the proximal and mid left foot. There is mild to moderate severity vascular calcification. Soft tissues are otherwise unremarkable. IMPRESSION: 1. No acute fracture or dislocation. 2. Chronic fracture deformities of the left lateral malleolus and left medial malleolus. Electronically Signed: By: Virgina Norfolk M.D. On: 06/18/2021 23:26   CT Knee  Left Wo Contrast  Result Date: 06/19/2021 CLINICAL DATA:  Proximal tibial and fibular fractures on x-rays yesterday. CT requested for operative planning. EXAM: CT OF THE LEFT KNEE WITHOUT CONTRAST TECHNIQUE: Multidetector CT imaging of the left knee was performed according to the standard protocol. Multiplanar CT image reconstructions were also generated. RADIATION DOSE REDUCTION: This exam was performed according to the departmental dose-optimization program which includes automated exposure control, adjustment of the mA and/or kV according to patient size and/or use of iterative reconstruction technique. COMPARISON:  Left knee plain films yesterday. FINDINGS: Bones/Joint/Cartilage Beam hardening artifact due to body habitus limits fine detail in the images. The bone mineralization is osteopenic. There are acute fractures of the proximal tibia and fibula. There is a transverse oblique proximal tibial shaft fracture beginning 6 cm below the tibial joint line with multiple comminution fragments along the anteromedial fracture margins and additional posterior comminution fragments. The comminution fragments demonstrate spreading around the fracture site, and the main distal tibial fragment is displaced laterally by up to 1/2 of a shaft width and anteriorly by about 1/3 of a shaft width with mild angulation towards the medial aspect. Just above this level is a comminuted transverse oblique fracture of the neck and proximal shaft of the fibula, with mild spreading of the comminution fragments, the distal fragment displaced laterally by an entire bone width and anteriorly by 1/2 of a shaft width, and the main distal fragment also mildly medially angulated. There is no tibial plateau fracture and no fracture line is seen extending into the proximal tibiofibular joint. There is advanced tricompartmental degenerative arthrosis  of the knee with bulky reactive osteophytes and joint space loss greatest in the medial  femorotibial joint where it is bone-on-bone. There is a small to moderate suprapatellar bursal effusion but it is low in density and not suspicious for hemarthrosis. There is a 1.5 cm osteochondral loose body in the anterior central femorotibial joint space and a posterolateral fabella. Ligaments Suboptimally assessed by CT. Muscles and Tendons The tendons not well seen due to beam hardening but grossly intact as far as visualized. The imaged portion of the lower extremity demonstrates normal muscle bulk. There is no appreciable intramuscular hematoma in the proximal foreleg. Soft tissues There is moderate nonlocalizing patchy subcutaneous hemorrhage in the anterior proximal foreleg at the level of the fractures. There are calcifications in the popliteal artery. There are subcutaneous calcifications in the anterior proximal foreleg which are probably phleboliths. IMPRESSION: 1. Proximal tibial and fibular fractures as described above with comminution, and with medial angulation of the distal fragments. 2. Advanced tricompartmental degenerative arthrosis of the knee, with a 1.5 cm loose body in the anterior central joint space and a small to moderate low-density suprapatellar bursal effusion. 3. Moderate nonlocalizing subcutaneous soft tissue hemorrhage in the anterior foreleg at the level of the fractures. No appreciable intramuscular hematoma. 4. Calcifications in the popliteal artery. Greater than usually expected at this age. Electronically Signed   By: Telford Nab M.D.   On: 06/19/2021 01:11   DG Knee Complete 4 Views Left  Result Date: 06/18/2021 CLINICAL DATA:  Post fall with deformity. EXAM: LEFT KNEE - COMPLETE 4+ VIEW COMPARISON:  None Available. FINDINGS: Comminuted proximal tibial shaft fracture. Tibial shaft is displaced laterally with respect to the proximal tibia. No obvious intra-articular involvement. Comminuted displaced proximal fibular fracture just proximal to the tibial fracture site.  Moderate underlying knee osteoarthritis. Cannot assess for knee joint effusion on provided views. IMPRESSION: 1. Comminuted displaced proximal tibial shaft fracture without obvious intra-articular involvement. 2. Comminuted displaced proximal fibular fracture. Electronically Signed   By: Keith Rake M.D.   On: 06/18/2021 23:29   DG Tibia/Fibula Left Port  Result Date: 06/19/2021 CLINICAL DATA:  Status post ORIF left proximal tibia fracture EXAM: PORTABLE LEFT TIBIA AND FIBULA - 2 VIEW COMPARISON:  06/18/2021 left tibia radiographs FINDINGS: Near-anatomic alignment of the comminuted proximal metadiaphysis left tibia fracture status post transfixation by intramedullary rod with multiple interlocking proximal and distal screws with no evidence of hardware fracture or loosening. Non articular proximal metaphysis left fibula fracture with mild residual 4 mm lateral displacement of the distal fracture fragment. No dislocation at the left ankle or left knee on these views. No suspicious focal osseous lesions. At least moderate tricompartmental left knee osteoarthritis. Mild-to-moderate left ankle osteoarthritis. Vascular calcifications throughout the soft tissues. Small plantar left calcaneal spur. IMPRESSION: 1. Near-anatomic alignment of the comminuted proximal left tibia fracture status post ORIF, with no evidence of hardware complication. 2. Mildly displaced non articular proximal left fibula fracture. Electronically Signed   By: Ilona Sorrel M.D.   On: 06/19/2021 16:05   DG Ankle Right Port  Result Date: 06/19/2021 CLINICAL DATA:  Leg injury, pain. EXAM: PORTABLE RIGHT ANKLE - 2 VIEW COMPARISON:  Foot radiograph yesterday. FINDINGS: Bones are subjectively under mineralized. Small osseous density again seen adjacent to the dorsal navicular, age indeterminate avulsion fracture. No other fracture of the ankle. There is a well corticated density distal to the medial malleolus that is chronic. No mortise  widening. Mild tibial talar degenerative change. Moderate plantar calcaneal  spur. Age advanced arterial vascular calcifications. Mild generalized soft tissue edema. IMPRESSION: 1. Small osseous density adjacent to the dorsal navicular, age indeterminate avulsion fracture. No other acute fracture of the ankle. 2. Age advanced arterial vascular calcifications. Electronically Signed   By: Keith Rake M.D.   On: 06/19/2021 01:11   DG Foot Complete Left  Result Date: 06/18/2021 CLINICAL DATA:  Status post fall. EXAM: LEFT FOOT - COMPLETE 3+ VIEW COMPARISON:  None Available. FINDINGS: There is no evidence of an acute fracture or dislocation. Mild to moderate severity degenerative changes are seen along the dorsal aspect of the proximal to mid left foot. A superficial soft tissue defect is seen along the anterior aspect of the distal left tibial shaft. IMPRESSION: 1. No acute fracture or dislocation. 2. Mild to moderate severity degenerative changes. 3. Superficial soft tissue defect along the anterior aspect of the distal left tibial shaft. Electronically Signed   By: Virgina Norfolk M.D.   On: 06/18/2021 23:29   DG Foot Complete Right  Result Date: 06/18/2021 CLINICAL DATA:  Status post fall. EXAM: RIGHT FOOT COMPLETE - 3+ VIEW COMPARISON:  None Available. FINDINGS: Small fracture deformities of indeterminate age are seen along the dorsal aspects of the right talus and right navicular bone. There is no evidence of dislocation. A small to moderate sized plantar calcaneal spur is seen. Mild dorsal soft tissue swelling is noted. IMPRESSION: 1. Small fracture deformities of indeterminate age along the dorsal aspects of the right talus and right navicular bone. Correlation with physical examination is recommended to determine the presence of point tenderness. 2. Small to moderate-sized plantar calcaneal spur. 3. Mild dorsal soft tissue swelling. Electronically Signed   By: Virgina Norfolk M.D.   On: 06/18/2021  23:35   DG C-Arm 1-60 Min-No Report  Result Date: 06/19/2021 Fluoroscopy was utilized by the requesting physician.  No radiographic interpretation.   DG C-Arm 1-60 Min-No Report  Result Date: 06/19/2021 Fluoroscopy was utilized by the requesting physician.  No radiographic interpretation.     LOS: 1 day   Antonieta Pert, MD [facility]  06/20/2021, 10:03 AM

## 2021-06-20 NOTE — Evaluation (Addendum)
Physical Therapy Evaluation Patient Details Name: Beth Marshall MRN: [id number] DOB: 07-18-1965 Today's Date: 06/22/2021  History of Present Illness  pt is 56 y.o. 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 pt found with L proximal tibia fracture, now POD 1 IMN with TDWB to LLE. R foot fxs to be treated nonoperatively.   Clinical Impression  Pt admitted with above diagnosis. PTA pt lived alone, PCA 5 days/week. She ambulated household distances with RW.  Pt currently with functional limitations due to the deficits listed below (see PT Problem List). On eval, she required mod assist rolling, and +2 total assist supine to sit. Pt puts forth good effort but still requires high level of assist due to weight and body habitus. Unable to safely assess sit to stand or SPT due to pt short stature and bari bed/air mattress. Pt would benefit from hoyer transfer to recliner to address sit to stand attempts. I anticipate she will have difficulty maintaining TDWB LLE. Pt will benefit from skilled PT to increase their independence and safety with mobility to allow discharge to the venue listed below.          Recommendations for follow up therapy are one component of a multi-disciplinary discharge planning process, led by the attending physician.  Recommendations may be updated based on patient status, additional functional criteria and insurance authorization.  Follow Up Recommendations Skilled nursing-short term rehab (<3 hours/day)    Assistance Recommended at Discharge Frequent or constant Supervision/Assistance  Patient can return home with the following       Equipment Recommendations Other (comment) (TBD)  Recommendations for Other Services       Functional Status Assessment Patient has had a recent decline in their  functional status and demonstrates the ability to make significant improvements in function in a reasonable and predictable amount of time.     Precautions / Restrictions Precautions Precautions: Fall;Other (comment) Precaution Comments: super morbid obesity Restrictions Weight Bearing Restrictions: Yes RLE Weight Bearing: Weight bearing as tolerated LLE Weight Bearing: Touchdown weight bearing      Mobility  Bed Mobility Overal bed mobility: Needs Assistance Bed Mobility: Rolling, Supine to Sit, Sit to Supine Rolling: Mod assist   Supine to sit: Max assist, +2 for physical assistance Sit to supine: Max assist, +2 for physical assistance   General bed mobility comments: good sustained sidelying, good effort and use of body mechanics, +rails    Transfers                   General transfer comment: deferred transfers out of bed this date due to safety. Will benefit from hoyer transfer to recliner to address sit to stand.    Ambulation/Gait                  Stairs            Wheelchair Mobility    Modified Rankin (Stroke Patients Only)       Balance Overall balance assessment: Mild deficits observed, not formally tested                                           Pertinent Vitals/Pain Pain Assessment Pain Assessment: No/denies pain    Home  Living Family/patient expects to be discharged to:: Private residence Living Arrangements: Alone Available Help at Discharge: Personal care attendant Type of Home: Apartment Home Access: Level entry       Home Layout: One level Home Equipment: Conservation officer, nature (2 wheels)      Prior Function Prior Level of Function : Needs assist             Mobility Comments: RW for mobility ADLs Comments: pt requiring A for bathing, meals, dressing and IADl tasks from aide available from 10-1 5 days/week     Hand Dominance   Dominant Hand: Right    Extremity/Trunk Assessment   Upper  Extremity Assessment Upper Extremity Assessment: Defer to OT evaluation    Lower Extremity Assessment Lower Extremity Assessment: RLE deficits/detail;LLE deficits/detail RLE Deficits / Details: foot fxs, in splint LLE Deficits / Details: tib fx s/p IMN    Cervical / Trunk Assessment Cervical / Trunk Assessment: Normal  Communication   Communication: No difficulties  Cognition Arousal/Alertness: Awake/alert Behavior During Therapy: WFL for tasks assessed/performed Overall Cognitive Status: Within Functional Limits for tasks assessed                                 General Comments: appears to be at baseline Ox4, overall good safety. needing frequent redirection to task.        General Comments General comments (skin integrity, edema, etc.): VSS on RA. Pt on 2L on arrival. Removed and on RA during assessment. Remained on RA with 96% SpO2    Exercises     Assessment/Plan    PT Assessment Patient needs continued PT services  PT Problem List Decreased mobility;Decreased activity tolerance;Decreased balance;Obesity;Decreased knowledge of precautions;Decreased knowledge of use of DME       PT Treatment Interventions DME instruction;Therapeutic activities;Therapeutic exercise;Patient/family education;Balance training;Functional mobility training    PT Goals (Current goals can be found in the Care Plan section)  Acute Rehab PT Goals Patient Stated Goal: home PT Goal Formulation: With patient Time For Goal Achievement: 07/04/21 Potential to Achieve Goals: Good    Frequency Min 3X/week     Co-evaluation PT/OT/SLP Co-Evaluation/Treatment: Yes Reason for Co-Treatment: Complexity of the patient's impairments (multi-system involvement);For patient/therapist safety;To address functional/ADL transfers PT goals addressed during session: Mobility/safety with mobility;Balance         AM-PAC PT "6 Clicks" Mobility  Outcome Measure Help needed turning from your back  to your side while in a flat bed without using bedrails?: A Lot Help needed moving from lying on your back to sitting on the side of a flat bed without using bedrails?: Total Help needed moving to and from a bed to a chair (including a wheelchair)?: Total Help needed standing up from a chair using your arms (e.g., wheelchair or bedside chair)?: Total Help needed to walk in hospital room?: Total Help needed climbing 3-5 steps with a railing? : Total 6 Click Score: 7    End of Session   Activity Tolerance: Patient tolerated treatment well Patient left: in bed;with call bell/phone within reach Nurse Communication: Mobility status;Need for lift equipment PT Visit Diagnosis: Other abnormalities of gait and mobility (R26.89)    Time:  -      Charges:              Lorrin Goodell, PT  Office # [phone] Pager [phone]   Lorriane Shire 06/22/2021, 11:51 AM

## 2021-06-20 NOTE — Progress Notes (Signed)
ANTICOAGULATION CONSULT NOTE   Pharmacy Consult for Warfarin and Heparin (until INR is therapeutic) Indication: history of DVT with factor V Leiden and protein S deficiency, atrial fibrillation  Allergies  Allergen Reactions   Cephalexin Other (See Comments)    unknown unknown    Codeine Nausea And Vomiting   Diphenhydramine Hcl Itching   Ranitidine Rash    Patient Measurements: Height: '5\' 2"'$  (157.5 cm) Weight: (!) 176.9 kg (390 lb) IBW/kg (Calculated) : 50.1 Heparin Dosing Weight:   Vital Signs: Temp: 97.9 F (36.6 C) (06/02 2035) Temp Source: Oral (06/02 2035) BP: 109/74 (06/02 2035) Pulse Rate: 88 (06/02 2035)  Labs: Recent Labs    06/18/21 2227 06/19/21 0327 06/19/21 1828 06/20/21 0306  HGB 12.8 13.4  --  9.6*  HCT 41.1 43.0  --  29.2*  PLT 246 236  --  182  LABPROT 29.5* 28.2* 19.6* 16.7*  INR 2.8* 2.7* 1.7* 1.4*  CREATININE 1.11*  --   --  0.62     Estimated Creatinine Clearance: 129.4 mL/min (by C-G formula based on SCr of 0.62 mg/dL).   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 ([place]) 10/27/2015   Dizziness 02/18/2015   Edema of extremities 02/18/2015   Elevated blood pressure reading 03/20/2015   Encounter for screening mammogram for breast cancer 10/27/2015   Factor V Leiden mutation ([place]) 10/27/2015   Gastroesophageal reflux disease 02/18/2015   Generalized anxiety disorder 02/18/2015   High risk medication use 07/25/2015   History of DVT (deep vein thrombosis) 03/15/2018   Hypokalemia 05/16/2015   Impaired fasting glucose 02/18/2015   Insomnia 02/18/2015   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   Lesion of ovary 02/18/2015   Lipoma 02/18/2015   Long term current use of anticoagulant therapy 03/20/2015    Migraine without aura and without status migrainosus, not intractable 06/25/2015   Moderate episode of recurrent major depressive disorder ([place]) 08/02/2018   Obesity, morbid, BMI 50 or higher ([place]) 02/18/2015   OSA (obstructive sleep apnea) 02/18/2015   Osteoarthritis 02/18/2015   Primary osteoarthritis of both knees 08/30/2017   Primary osteoarthritis of left knee 04/22/2015   Protein S deficiency ([place]) 02/18/2015   Shortness of breath 02/18/2015   Tachycardia 07/01/2015   Tachycardia 07/01/2015    Medications:  Medications Prior to Admission  Medication Sig Dispense Refill Last Dose   albuterol (PROVENTIL HFA;VENTOLIN HFA) 108 (90 BASE) MCG/ACT inhaler Inhale 1 puff into the lungs every 6 (six) hours as needed for wheezing or shortness of breath.   unknown   augmented betamethasone dipropionate (DIPROLENE-AF) 0.05 % cream Apply 1 application. topically 2 (two) times daily.   06/17/2021   BELSOMRA 10 MG TABS Take 1 tablet by mouth at bedtime as needed.   unknown   budesonide-formoterol (SYMBICORT) 160-4.5 MCG/ACT inhaler Inhale 2 puffs into the lungs daily.   unknown   calcipotriene (DOVONOX) 0.005 % cream Apply 1 application. topically 2 (two) times daily.   06/17/2021   cetirizine (ZYRTEC) 10 MG tablet Take 1 tablet by mouth daily.   06/17/2021   cyclobenzaprine (FLEXERIL) 5 MG tablet Take 5 mg by mouth 3 (three) times daily as needed for muscle spasms.   06/17/2021   diclofenac Sodium (VOLTAREN) 1 % GEL Apply 2-3  g topically as needed for pain.   06/17/2021   furosemide (LASIX) 40 MG tablet Take 40 mg by mouth daily.   06/17/2021   montelukast (SINGULAIR) 10 MG tablet Take 1 tablet by mouth daily.   06/17/2021   oxyCODONE-acetaminophen (PERCOCET/ROXICET) 5-325 MG tablet Take by mouth.   06/18/2021   potassium chloride (K-DUR) 10 MEQ tablet Take 10 mEq by mouth 2 (two) times daily.   06/17/2021   SUMAtriptan (IMITREX) 100 MG tablet Take 1 tablet by mouth as needed for migraine.   06/16/2021   traMADol  (ULTRAM) 50 MG tablet Take 1 tablet by mouth as needed for pain.   06/18/2021   venlafaxine XR (EFFEXOR-XR) 75 MG 24 hr capsule Take 1 capsule by mouth daily.   06/17/2021   warfarin (COUMADIN) 5 MG tablet Take 1 1/2 tablets daily or as directed by [facility] Clinic. PLEASE KEEP UPCOMING CARDIOLOGY APPT FOR FUTURE REFILLS. 50 tablet 0 06/18/2021 at 4:30pm    Assessment: Pt on warfarin PTA for 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 Admission INR 2.7. CBC ok on admission. Home dose: 7.'5mg'$  daily - last 6/1 Reversed with vit K '5mg'$  IV 6/2. Plan for IM nailing today 6/2.  Goal of Therapy:  INR 2-3 Monitor platelets by anticoagulation protocol: Yes   Plan:  INR daily Plan to start heparin bridge if INR <2 F/u surgery plans - plans for today - likely start heparin post-OR  Addendum (1500): S/p L proximal tibia nailing. Ortho okay to restart anticoagulation POD #1 (6/3). Confirmed with Dr. Antonieta Pert that he would like heparin bridge if INR <2 on 6/3 a.m. check.  ===================================  6/3 AM update:  -INR is 1.4 this AM -Starting heparin/warfarin this AM (see previous pharmacist discussion with [facility], they do want heparin bridge) -Hgb down some post-op  Plan: -Start heparin drip at 1500 units/hr -Check heparin level in 6-8 hours -Warfarin 7.5 mg PO x 1 at 1600 today -Daily PT/INR -Monitor for bleeding  Narda Bonds, PharmD, [facility] Pharmacist Phone: [phone]

## 2021-06-20 NOTE — Progress Notes (Signed)
1423: Indwelling catheter discontinued per MD order. Procedure explained to patient. Pt agreeable. Pt tolerated procedure well. Catheter tip intact. Pt DVT at approximately 11:26AM. Pt instructed to call for assistance.

## 2021-06-21 DIAGNOSIS — S8292XA Unspecified fracture of left lower leg, initial encounter for closed fracture: Secondary | ICD-10-CM | POA: Diagnosis not present

## 2021-06-21 DIAGNOSIS — S93601A Unspecified sprain of right foot, initial encounter: Secondary | ICD-10-CM | POA: Diagnosis present

## 2021-06-21 LAB — BASIC METABOLIC PANEL
Anion gap: 5 (ref 5–15)
BUN: 14 mg/dL (ref 6–20)
CO2: 27 mmol/L (ref 22–32)
Calcium: 8.6 mg/dL — ABNORMAL LOW (ref 8.9–10.3)
Chloride: 103 mmol/L (ref 98–111)
Creatinine, Ser: 0.68 mg/dL (ref 0.44–1.00)
GFR, Estimated: >60 mL/min (ref >60)
Glucose, Bld: 138 mg/dL — ABNORMAL HIGH (ref 70–99)
Potassium: 3.8 mmol/L (ref 3.5–5.1)
Sodium: 135 mmol/L (ref 135–145)

## 2021-06-21 LAB — CBC
HCT: 28.3 % — ABNORMAL LOW (ref 36.0–46.0)
Hemoglobin: 9.1 g/dL — ABNORMAL LOW (ref 12.0–15.0)
MCH: 30.5 pg (ref 26.0–34.0)
MCHC: 32.2 g/dL (ref 30.0–36.0)
MCV: 95 fL (ref 80.0–100.0)
Platelets: 175 10*3/uL (ref 150–400)
RBC: 2.98 MIL/uL — ABNORMAL LOW (ref 3.87–5.11)
RDW: 13.4 % (ref 11.5–15.5)
WBC: 13.2 10*3/uL — ABNORMAL HIGH (ref 4.0–10.5)
nRBC: 0 % (ref 0.0–0.2)

## 2021-06-21 LAB — PROTIME-INR
INR: 1.2 (ref 0.8–1.2)
Prothrombin Time: 14.9 seconds (ref 11.4–15.2)

## 2021-06-21 LAB — HEPARIN LEVEL (UNFRACTIONATED): Heparin Unfractionated: 0.44 IU/mL (ref 0.30–0.70)

## 2021-06-21 MED ORDER — WARFARIN SODIUM 5 MG PO TABS
10.0000 mg | ORAL_TABLET | Freq: Once | ORAL | Status: AC
Start: 2021-06-21 — End: 2021-06-21
  Administered 2021-06-21: 10 mg via ORAL
  Filled 2021-06-21: qty 2

## 2021-06-21 NOTE — Progress Notes (Signed)
PROGRESS NOTE Beth Marshall  OIN:[id number] DOB: [birth year] DOA: 06/18/2021 PCP: Madison Hickman, FNP   Brief Narrative/Hospital Course: 56 y.o. 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 She lives alone and for the past week she has noticed increasing left foot pain.  Pain became so severe about 2 days PTA that she could barely walk.  She got up out of bed and fell.  She presented to [facility] PTA but reportedly had no significant findings.  Then earlier on 06/18/21  she had difficulty getting up from the toilet and then fell back onto the commode when she tried to stand up.  She then called EMS and after they stood her up she fell again and heard her knee pop. At baseline she requires assistant with a walker and has chronic leg pain. In the ED, she was found to have fractures in her left lateral, medial malleolus, comminuted displaced fracture of the left tibial shaft.  She also had avulsion fracture of the right navicular. ED PA discussed with orthopedic and admitted.  Patient underwent IM nailing of left proximal tibia 6/2 after vitamin K for coagulopathy.  She was resumed back on heparin and Coumadin next day given high risk for VTE and A-fib history    Subjective: Patient was seen and examined during rounds this morning.  Alert awake oriented .  No new complaints Overnight afebrile BP stable on room air hemoglobin 9.1 g INR subtherapeutic 1.2, remains on heparin drip  Assessment and Plan: Principal Problem:   Fracture of lower extremity Active Problems:   History of DVT (deep vein thrombosis)   Factor V Leiden mutation (H[facility])   Obesity, morbid, BMI 50 or higher ([place])   Protein S deficiency (H[facility])   Atrial fibrillation ([facility])   Ankle fracture   Tibia/fibula fracture, left, closed, initial encounter    Fracture of lower extremity-left tibia: s/p IM nailing of left proximal tibia Dr Doreatha Martin 6/2.  Continue pain control, with scheduled tramadol every 6 hours, Dilaudid PT OT dressing DVT prophylaxis as per orthopedics.  Acute blood loss anemia in the setting of fracture/coagulopathy due to Coumadin, postoperative anemia due to expected blood loss: monitor hh closely and transfuse as needed. Recent Labs  Lab 06/18/21 2227 06/19/21 0327 06/20/21 0306 06/21/21 0215  HGB 12.8 13.4 9.6* 9.1*  HCT 41.1 43.0 29.2* 28.3*     Right foot fracture unsure if new or old, advised cam boot WBAT   History of DVT Factor V Leiden mutation Protein S deficiency Paroxysmal atrial fibrillation- in NSR now: Patient received vitamin for reversal preoperatively.INR  low- cont heparin bridge/coumadin- pt with VTE hx/ factor V/ protein S deficiency and A-fib history-pharmacy monitoring and dosing Coumadin, will monitor hemoglobin closely.   Obesity, morbid, BMI  71-currently trying to lose weight per her OSA intolerant to CPAP.   DVT prophylaxis: SCDs Start: 06/19/21 1514 Code Status:   Code Status: Full Code Family Communication: plan of care discussed with patient at bedside. Patient status is: Inpatient because of ongoing management of post tibia fracture Level of care: Telemetry Medical   Dispo: The patient is from: HOME            Anticipated disposition: SNF once INR therapeutic   Mobility Assessment (last 72 hours)     Mobility Assessment     Row Name  06/20/21 2100 06/20/21 1538 06/19/21 1545       Does patient have an order for bedrest or is patient medically unstable No - Continue assessment -- No - Continue assessment     What is the highest level of mobility based on the progressive mobility assessment? Level 2 (Ch[place]) - Balance while sitting on edge of bed and cannot stand Level 2 (Chairfast) - Balance while sitting on edge of bed and cannot stand Level 2 (Chairfast) - Balance while  sitting on edge of bed and cannot stand     Is the above level different from baseline mobility prior to current illness? Yes - Recommend PT order -- Yes - Recommend PT order               Objective: Vitals last 24 hrs: Vitals:   06/20/21 0703 06/20/21 1402 06/20/21 1918 06/21/21 0731  BP: (!) 111/54 (!) 105/56 106/60 (!) 119/55  Pulse: 64 92 98 66  Resp: '12 18 18   '$ Temp: 99 F (37.2 C) 98 F (36.7 C) 98.3 F (36.8 C) 98 F (36.7 C)  TempSrc: Oral  Oral Oral  SpO2: 97% 97% 93% 96%  Weight:      Height:       Weight change:   Physical Examination: General exam: AA oriented, morbidly obese, pleasant, older than stated age, weak appearing. HEENT:Oral mucosa moist, Ear/Nose WNL grossly, dentition normal. Respiratory system: bilaterally diminished, no use of accessory muscle Cardiovascular system: S1 & S2 +, No JVD,. Gastrointestinal system: Abdomen soft,NT,ND,BS+ Nervous System:Alert, awake, moving extremities and grossly nonfocal Extremities: LE ankle edema none, left lower extremity with dressing in place, Aquacel dressing  on LLE with bandage wrap on LLE Skin: No rashes,no icterus. MSK: Normal muscle bulk,tone, power   Medications reviewed:  Scheduled Meds:  docusate sodium  100 mg Oral BID   furosemide  40 mg Oral Daily   loratadine  10 mg Oral Daily   mometasone-formoterol  2 puff Inhalation BID   montelukast  10 mg Oral Daily   potassium chloride  10 mEq Oral BID   traMADol  50 mg Oral Q6H   triamcinolone cream   Topical BID   venlafaxine XR  75 mg Oral Daily   warfarin  10 mg Oral ONCE-1600   Warfarin - Pharmacist Dosing Inpatient   Does not apply q1600   Continuous Infusions:  sodium chloride 75 mL/hr at 06/19/21 1638   heparin 1,800 Units/hr (06/20/21 2012)    Diet Order             Diet Heart Room service appropriate? Yes; Fluid consistency: Thin  Diet effective now                 No intake or output data in the 24 hours ending 06/21/21  1116  Net IO Since Admission: 1,367.82 mL [06/21/21 1116]  Wt Readings from Last 3 Encounters:  06/19/21 (!) 176.9 kg  05/07/14 (!) 181.4 kg     Unresulted Labs (From admission, onward)     Start     Ordered   06/22/21 0500  CBC  Daily,   R     Question:  Specimen collection method  Answer:  Lab=Lab collect   06/21/21 0851   06/21/21 0500  Heparin level (unfractionated)  Daily,   R     Question:  Specimen collection method  Answer:  Lab=Lab collect   06/20/21 1337   06/19/21 0032  Protime-INR  Daily,   R  06/19/21 0033          Data Reviewed: I have personally reviewed following labs and imaging studies CBC: Recent Labs  Lab 06/18/21 2227 06/19/21 0327 06/20/21 0306 06/21/21 0215  WBC 10.2 11.6* 9.8 13.2*  NEUTROABS 7.0  --   --   --   HGB 12.8 13.4 9.6* 9.1*  HCT 41.1 43.0 29.2* 28.3*  MCV 98.3 98.2 94.8 95.0  PLT 246 236 182 825   Basic Metabolic Panel: Recent Labs  Lab 06/18/21 2227 06/20/21 0306 06/21/21 0215  NA 136 137 135  K 4.5 3.7 3.8  CL 103 104 103  CO2 '23 26 27  '$ GLUCOSE 151* 193* 138*  BUN '12 10 14  '$ CREATININE 1.11* 0.62 0.68  CALCIUM 9.2 8.6* 8.6*   GFR: Estimated Creatinine Clearance: 129.4 mL/min (by C-G formula based on SCr of 0.68 mg/dL). Liver Function Tests: Recent Labs  Lab 06/18/21 2227  AST 33  ALT 20  ALKPHOS 85  BILITOT 1.2  PROT 6.7  ALBUMIN 3.7   No results for input(s): LIPASE, AMYLASE in the last 168 hours. No results for input(s): AMMONIA in the last 168 hours. Coagulation Profile: Recent Labs  Lab 06/18/21 2227 06/19/21 0327 06/19/21 1828 06/20/21 0306 06/21/21 0215  INR 2.8* 2.7* 1.7* 1.4* 1.2   BNP (last 3 results) No results for input(s): PROBNP in the last 8760 hours. HbA1C: No results for input(s): HGBA1C in the last 72 hours. CBG: No results for input(s): GLUCAP in the last 168 hours. Lipid Profile: No results for input(s): CHOL, HDL, LDLCALC, TRIG, CHOLHDL, LDLDIRECT in the last 72  hours. Thyroid Function Tests: No results for input(s): TSH, T4TOTAL, FREET4, T3FREE, THYROIDAB in the last 72 hours. Sepsis Labs: No results for input(s): PROCALCITON, LATICACIDVEN in the last 168 hours.  Recent Results (from the past 240 hour(s))  Surgical pcr screen     Status: None   Collection Time: 06/19/21 11:21 AM   Specimen: Nasal Mucosa; Nasal Swab  Result Value Ref Range Status   MRSA, PCR NEGATIVE NEGATIVE Final   Staphylococcus aureus NEGATIVE NEGATIVE Final    Comment: (NOTE) The Xpert SA Assay (FDA approved for NASAL specimens in patients 66 years of age and older), is one component of a comprehensive surveillance program. It is not intended to diagnose infection nor to guide or monitor treatment. Performed at [facility], [place] [address], [place], [place] [address]     Antimicrobials: Anti-infectives (From admission, onward)    Start     Dose/Rate Route Frequency Ordered Stop   06/20/21 0000  vancomycin (VANCOCIN) IVPB 1000 mg/200 mL premix        1,000 mg 200 mL/hr over 60 Minutes Intravenous Every 12 hours 06/19/21 1513 06/20/21 0149   06/19/21 1336  vancomycin (VANCOCIN) powder  Status:  Discontinued          As needed 06/19/21 1337 06/19/21 1410   06/19/21 1130  vancomycin (VANCOREADY) IVPB 1500 mg/300 mL        1,500 mg 150 mL/hr over 120 Minutes Intravenous To ShortStay Surgical 06/19/21 1112 06/19/21 1239      Culture/Microbiology No results found for: SDES, SPECREQUEST, CULT, REPTSTATUS  Other culture-see note  Radiology Studies: DG Tibia/Fibula Left  Result Date: 06/19/2021 CLINICAL DATA:  Left tibial nail intraoperative fluoroscopy. EXAM: LEFT TIBIA AND FIBULA - 2 VIEW COMPARISON:  Left knee and left ankle radiographs 06/18/2021 FINDINGS: Images were performed intraoperatively without the presence of a radiologist. The patient is undergoing  left tibial long stem intramedullary nail fixation spanning the comminuted and displaced proximal tibial  metadiaphyseal fracture. There is improved anatomic alignment with persistent fracture line lucency. Redemonstration of comminuted and mildly displaced proximal fibular diaphyseal fracture. Redemonstration of chronic fracture deformity of the distal fibula. Total fluoroscopy images: 8 Total fluoroscopy time: 156 seconds Total dose: Radiation Exposure Index (as provided by the fluoroscopic device): 17.71 mGy air Kerma Please see intraoperative findings for further detail. IMPRESSION: Intraoperative fluoroscopy for left tibial ORIF. Electronically Signed   By: Yvonne Kendall M.D.   On: 06/19/2021 14:04   DG Tibia/Fibula Left Port  Result Date: 06/19/2021 CLINICAL DATA:  Status post ORIF left proximal tibia fracture EXAM: PORTABLE LEFT TIBIA AND FIBULA - 2 VIEW COMPARISON:  06/18/2021 left tibia radiographs FINDINGS: Near-anatomic alignment of the comminuted proximal metadiaphysis left tibia fracture status post transfixation by intramedullary rod with multiple interlocking proximal and distal screws with no evidence of hardware fracture or loosening. Non articular proximal metaphysis left fibula fracture with mild residual 4 mm lateral displacement of the distal fracture fragment. No dislocation at the left ankle or left knee on these views. No suspicious focal osseous lesions. At least moderate tricompartmental left knee osteoarthritis. Mild-to-moderate left ankle osteoarthritis. Vascular calcifications throughout the soft tissues. Small plantar left calcaneal spur. IMPRESSION: 1. Near-anatomic alignment of the comminuted proximal left tibia fracture status post ORIF, with no evidence of hardware complication. 2. Mildly displaced non articular proximal left fibula fracture. Electronically Signed   By: Ilona Sorrel M.D.   On: 06/19/2021 16:05   DG C-Arm 1-60 Min-No Report  Result Date: 06/19/2021 Fluoroscopy was utilized by the requesting physician.  No radiographic interpretation.   DG C-Arm 1-60 Min-No  Report  Result Date: 06/19/2021 Fluoroscopy was utilized by the requesting physician.  No radiographic interpretation.     LOS: 2 days   Antonieta Pert, MD [facility]  06/21/2021, 11:16 AM

## 2021-06-21 NOTE — Progress Notes (Signed)
Orthopedic Tech Progress Note Patient Details:  Beth Marshall Aug 23, 1965 [id number]  Ortho Devices Type of Ortho Device: Postop shoe/boot Ortho Device/Splint Location: RLE Ortho Device/Splint Interventions: Ordered   Post Interventions Patient Tolerated: Fair Instructions Provided: Adjustment of device, Care of device Shoe dropped off in patients room and instructions to wear only when ambulating provided. Vernona Rieger 06/21/2021, 7:07 PM

## 2021-06-21 NOTE — NC FL2 (Signed)
[place] LEVEL OF CARE SCREENING TOOL     IDENTIFICATION  Patient Name: Beth Marshall Birthdate: Sep 23, 1965 Sex: female Admission Date (Current Location): 06/18/2021  [place] and [place] Number:  Kathleen Argue [id number] [place] and Address:  [facility] [place], [place] [address], [place], [place] [address]      Provider Number: [id number]  Attending Physician Name and Address:  Antonieta Pert, MD  Relative Name and Phone Number:  Binnie Rail  [phone]  [phone]    Current Level of Care: Hospital Recommended Level of Care: [place] Prior Approval Number:    Date Approved/Denied:   PASRR Number:    Discharge Plan: SNF    Current Diagnoses: Patient Active Problem List   Diagnosis Date Noted   Ankle fracture 06/19/2021   Tibia/fibula fracture, left, closed, initial encounter 06/19/2021   Fracture of lower extremity 06/19/2021   Atrial fibrillation ([place]) 05/06/2021   Moderate episode of recurrent major depressive disorder ([place]) 08/02/2018   History of DVT (deep vein thrombosis) 03/15/2018   Primary osteoarthritis of both knees 08/30/2017   Arthritis of right knee 12/20/2016   Chronic deep vein thrombosis (DVT) of lower extremity ([place]) 10/27/2015   Encounter for screening mammogram for breast cancer 10/27/2015   Factor V Leiden mutation ([place]) 10/27/2015   High risk medication use 07/25/2015   Migraine without aura and without status migrainosus, not intractable 06/25/2015   Hypokalemia 05/16/2015   Primary osteoarthritis of left knee 04/22/2015   Elevated blood pressure reading 03/20/2015   Long term (current) use of anticoagulants 03/20/2015   Edema of extremities 02/18/2015   Dizziness 02/18/2015   Back pain 02/18/2015   Abnormal coagulation profile 02/18/2015   Generalized anxiety disorder 02/18/2015   Gastroesophageal reflux disease 02/18/2015   Lesion of ovary 02/18/2015   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   Insomnia  02/18/2015   Impaired fasting glucose 02/18/2015   OSA (obstructive sleep apnea) 02/18/2015   Obesity, morbid, BMI 50 or higher ([place]) 02/18/2015   Lipoma 02/18/2015   Shortness of breath 02/18/2015   Protein S deficiency ([place]) 02/18/2015   Osteoarthritis 02/18/2015   Calculus of gallbladder with cholecystitis and obstruction 06/23/2014    Orientation RESPIRATION BLADDER Height & Weight     Self, Time, Situation, Place  O2 Continent Weight: (!) 390 lb (176.9 kg) Height:  '5\' 2"'$  (157.5 cm)  BEHAVIORAL SYMPTOMS/MOOD NEUROLOGICAL BOWEL NUTRITION STATUS      Continent Diet (see discharge summary)  AMBULATORY STATUS COMMUNICATION OF NEEDS Skin   Total Care Verbally Other (Comment) (redness)                       Personal Care Assistance Level of Assistance  Bathing, Feeding, Dressing Bathing Assistance: Maximum assistance Feeding assistance: Limited assistance Dressing Assistance: Maximum assistance     Functional Limitations Info  Sight, Hearing, Speech Sight Info: Adequate Hearing Info: Adequate Speech Info: Adequate    SPECIAL CARE FACTORS FREQUENCY  PT (By licensed PT), OT (By licensed OT)     PT Frequency: 5x week OT Frequency: 5x week            Contractures Contractures Info: Not present    Additional Factors Info  Code Status, Allergies Code Status Info: full Allergies Info: Cephalexin, Codeine, Diphenhydramine Hcl, Ranitidine           Current Medications (06/21/2021):  This is the current hospital active medication list Current Facility-Administered Medications  Medication Dose Route Frequency Provider Last Rate Last  Admin   0.9 %  sodium chloride infusion   Intravenous Continuous Corinne Ports, PA-C 75 mL/hr at 06/19/21 1638 New Bag at 06/19/21 1638   acetaminophen (TYLENOL) tablet 325-650 mg  325-650 mg Oral Q6H PRN Corinne Ports, PA-C       albuterol (PROVENTIL) (2.5 MG/3ML) 0.083% nebulizer solution 2.5 mg  2.5 mg Inhalation Q6H PRN  Corinne Ports, PA-C       cyclobenzaprine (FLEXERIL) tablet 5 mg  5 mg Oral TID PRN Corinne Ports, PA-C       diclofenac Sodium (VOLTAREN) 1 % topical gel 2 g  2 g Topical PRN Kc, Ramesh, MD       docusate sodium (COLACE) capsule 100 mg  100 mg Oral BID McClung, Sarah A, PA-C       furosemide (LASIX) tablet 40 mg  40 mg Oral Daily Corinne Ports, PA-C   40 mg at 06/19/21 1640   heparin ADULT infusion 100 units/mL (25000 units/275m)  1,800 Units/hr Intravenous Continuous Hammons, Mirtie B, RPH 18 mL/hr at 06/20/21 2012 1,800 Units/hr at 06/20/21 2012   HYDROmorphone (DILAUDID) injection 0.5-1 mg  0.5-1 mg Intravenous Q3H PRN MCorinne Ports PA-C       loperamide (IMODIUM) capsule 2 mg  2 mg Oral PRN KAntonieta Pert MD   2 mg at 06/20/21 1214   loratadine (CLARITIN) tablet 10 mg  10 mg Oral Daily Kc, Ramesh, MD       metoCLOPramide (REGLAN) tablet 5-10 mg  5-10 mg Oral Q8H PRN MThereasa Solo Sarah A, PA-C       Or   metoCLOPramide (REGLAN) injection 5-10 mg  5-10 mg Intravenous Q8H PRN MCorinne Ports PA-C       mometasone-formoterol (DULERA) 200-5 MCG/ACT inhaler 2 puff  2 puff Inhalation BID Kc, Ramesh, MD   2 puff at 06/20/21 0821   montelukast (SINGULAIR) tablet 10 mg  10 mg Oral Daily MCorinne Ports PA-C   10 mg at 06/21/21 0820   ondansetron (ZOFRAN) tablet 4 mg  4 mg Oral Q6H PRN MCorinne Ports PA-C       Or   ondansetron (ZOFRAN) injection 4 mg  4 mg Intravenous Q6H PRN MCorinne Ports PA-C       oxyCODONE-acetaminophen (PERCOCET/ROXICET) 5-325 MG per tablet 1-2 tablet  1-2 tablet Oral Q4H PRN MCorinne Ports PA-C   2 tablet at 06/19/21 2103   polyethylene glycol (MIRALAX / GLYCOLAX) packet 17 g  17 g Oral Daily PRN MRushie NyhanA, PA-C       potassium chloride (KLOR-CON M) CR tablet 10 mEq  10 mEq Oral BID MRushie NyhanA, PA-C   10 mEq at 06/21/21 0820   SUMAtriptan (IMITREX) tablet 100 mg  100 mg Oral PRN KAntonieta Pert MD   100 mg at 06/21/21 0056   traMADol (ULTRAM)  tablet 50 mg  50 mg Oral Q6H McClung, Sarah A, PA-C   50 mg at 06/21/21 0612   triamcinolone cream (KENALOG) 0.5 %   Topical BID JSuzzanne Cloud [facility]  Given at 06/21/21 05462  venlafaxine XR (EFFEXOR-XR) 24 hr capsule 75 mg  75 mg Oral Daily Kc, RMaren Beach MD   75 mg at 06/20/21 2149   warfarin (COUMADIN) tablet 10 mg  10 mg Oral ONCE-1600 Hammons, KTheone Murdoch [facility]      Warfarin - Pharmacist Dosing Inpatient   Does not apply q1600 LErenest Blank [facility]  Given at 06/20/21 17035  Discharge Medications: Please see discharge summary for a list of discharge medications.  Relevant Imaging Results:  Relevant Lab Results:   Additional Information SSN: [id number].  Pt is vaccinated for covid with one booster.  Joanne Chars, LCSW

## 2021-06-21 NOTE — Progress Notes (Signed)
Beth Marshall for Warfarin and Heparin (until INR is therapeutic) Indication: history of DVT with factor V Leiden and protein S deficiency, atrial fibrillation  Allergies  Allergen Reactions   Cephalexin Other (See Comments)    unknown unknown    Codeine Nausea And Vomiting   Diphenhydramine Hcl Itching   Ranitidine Rash    Patient Measurements: Height: '5\' 2"'$  (157.5 cm) Weight: (!) 176.9 kg (390 lb) IBW/kg (Calculated) : 50.1 HEPARIN DW (KG): 105 kg   Vital Signs: Temp: 98 F (36.7 C) (06/04 0731) Temp Source: Oral (06/04 0731) BP: 119/55 (06/04 0731) Pulse Rate: 66 (06/04 0731)  Labs: Recent Labs    06/18/21 2227 06/19/21 0327 06/19/21 1828 06/20/21 0306 06/20/21 1216 06/20/21 2007 06/21/21 0215  HGB 12.8 13.4  --  9.6*  --   --  9.1*  HCT 41.1 43.0  --  29.2*  --   --  28.3*  PLT 246 236  --  182  --   --  175  LABPROT 29.5* 28.2* 19.6* 16.7*  --   --  14.9  INR 2.8* 2.7* 1.7* 1.4*  --   --  1.2  HEPARINUNFRC  --   --   --   --  0.27* 0.41 0.44  CREATININE 1.11*  --   --  0.62  --   --  0.68     Estimated Creatinine Clearance: 129.4 mL/min (by C-G formula based on SCr of 0.68 mg/dL).   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 ([place]) 10/27/2015   Dizziness 02/18/2015   Edema of extremities 02/18/2015   Elevated blood pressure reading 03/20/2015   Encounter for screening mammogram for breast cancer 10/27/2015   Factor V Leiden mutation ([place]) 10/27/2015   Gastroesophageal reflux disease 02/18/2015   Generalized anxiety disorder 02/18/2015   High risk medication use 07/25/2015   History of DVT (deep vein thrombosis) 03/15/2018   Hypokalemia 05/16/2015   Impaired fasting  glucose 02/18/2015   Insomnia 02/18/2015   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   Lesion of ovary 02/18/2015   Lipoma 02/18/2015   Long term current use of anticoagulant therapy 03/20/2015   Migraine without aura and without status migrainosus, not intractable 06/25/2015   Moderate episode of recurrent major depressive disorder ([place]) 08/02/2018   Obesity, morbid, BMI 50 or higher ([place]) 02/18/2015   OSA (obstructive sleep apnea) 02/18/2015   Osteoarthritis 02/18/2015   Primary osteoarthritis of both knees 08/30/2017   Primary osteoarthritis of left knee 04/22/2015   Protein S deficiency ([place]) 02/18/2015   Shortness of breath 02/18/2015   Tachycardia 07/01/2015   Tachycardia 07/01/2015    Medications:  Medications Prior to Admission  Medication Sig Dispense Refill Last Dose   albuterol (PROVENTIL HFA;VENTOLIN HFA) 108 (90 BASE) MCG/ACT inhaler Inhale 1 puff into the lungs every 6 (six) hours as needed for wheezing or shortness of breath.   unknown   augmented betamethasone dipropionate (DIPROLENE-AF) 0.05 % cream Apply 1 application. topically 2 (two) times daily.   06/17/2021   BELSOMRA 10 MG TABS Take 1 tablet by mouth at bedtime as needed.   unknown   budesonide-formoterol (SYMBICORT) 160-4.5 MCG/ACT inhaler Inhale 2 puffs into the lungs daily.   unknown  calcipotriene (DOVONOX) 0.005 % cream Apply 1 application. topically 2 (two) times daily.   06/17/2021   cetirizine (ZYRTEC) 10 MG tablet Take 1 tablet by mouth daily.   06/17/2021   cyclobenzaprine (FLEXERIL) 5 MG tablet Take 5 mg by mouth 3 (three) times daily as needed for muscle spasms.   06/17/2021   diclofenac Sodium (VOLTAREN) 1 % GEL Apply 2-3 g topically as needed for pain.   06/17/2021   furosemide (LASIX) 40 MG tablet Take 40 mg by mouth daily.   06/17/2021   montelukast (SINGULAIR) 10 MG tablet Take 1 tablet by mouth daily.   06/17/2021   oxyCODONE-acetaminophen (PERCOCET/ROXICET) 5-325 MG tablet Take by mouth.   06/18/2021   potassium chloride (K-DUR) 10  MEQ tablet Take 10 mEq by mouth 2 (two) times daily.   06/17/2021   SUMAtriptan (IMITREX) 100 MG tablet Take 1 tablet by mouth as needed for migraine.   06/16/2021   traMADol (ULTRAM) 50 MG tablet Take 1 tablet by mouth as needed for pain.   06/18/2021   venlafaxine XR (EFFEXOR-XR) 75 MG 24 hr capsule Take 1 capsule by mouth daily.   06/17/2021   warfarin (COUMADIN) 5 MG tablet Take 1 1/2 tablets daily or as directed by Coumadin Clinic. PLEASE KEEP UPCOMING CARDIOLOGY APPT FOR FUTURE REFILLS. 50 tablet 0 06/18/2021 at 4:30pm    Assessment: Pt on warfarin PTA for history of DVT with factor V Leiden and protein S deficiency, and atrial fibrillation. Admission INR 2.7. Reversed with vit K '5mg'$  IV 6/2 in anticipation of OR for IM nailing. CBC ok on admission.  Home dose: 7.'5mg'$  daily - last 6/1  Pt now s/p OR and continues on heparin bridge until therapeutic INR with warfarin.  Heparin level therapeutic on 1800 units/hr.  INR remains subtherapeutic as expected after Vit K reversal.  Will increase warfarin dose slightly to overcome resistance.   Goal of Therapy:  INR 2-3 Heparin level goal 0.3-0.7 Monitor platelets by anticoagulation protocol: Yes   Plan:  Continue heparin at 1800 units/hr Warfarin '10mg'$  PO x 1 tonight  Daily heparin level, CBC, and INR  [employer], Pharm.D., BCPS Clinical Pharmacist Clinical phone for 06/21/2021 from 7:30-3:00 is [phone].  **Pharmacist phone directory can be found on [email or web address] listed under [place].  06/21/2021 8:49 AM

## 2021-06-21 NOTE — Progress Notes (Signed)
Orthopaedic Trauma Service Progress Note  Patient ID: Beth Marshall MRN: [id number] DOB/AGE: Aug 28, 1965 56 y.o.  Subjective:  Doing ok this am  No specific complaints States she sat on EOB yesterday  Does not look like she had a formal therapy session yesterday   Has selected a rehab facility in [place] but sounds like the process is just getting started   ROS As above  Objective:   VITALS:   Vitals:   06/20/21 0703 06/20/21 1402 06/20/21 1918 06/21/21 0731  BP: (!) 111/54 (!) 105/56 106/60 (!) 119/55  Pulse: 64 92 98 66  Resp: '12 18 18   '$ Temp: 99 F (37.2 C) 98 F (36.7 C) 98.3 F (36.8 C) 98 F (36.7 C)  TempSrc: Oral  Oral Oral  SpO2: 97% 97% 93% 96%  Weight:      Height:        Estimated body mass index is 71.33 kg/m as calculated from the following:   Height as of this encounter: '5\' 2"'$  (1.575 m).   Weight as of this encounter: 176.9 kg.   Intake/Output      06/03 0701 06/04 0700 06/04 0701 06/05 0700   P.O.     I.V. (mL/kg)     IV Piggyback     Total Intake(mL/kg)     Urine (mL/kg/hr)     Blood     Total Output     Net            LABS  Results for orders placed or performed during the hospital encounter of 06/18/21 (from the past 24 hour(s))  Heparin level (unfractionated)     Status: Abnormal   Collection Time: 06/20/21 12:16 PM  Result Value Ref Range   Heparin Unfractionated 0.27 (L) 0.30 - 0.70 IU/mL  Heparin level (unfractionated)     Status: None   Collection Time: 06/20/21  8:07 PM  Result Value Ref Range   Heparin Unfractionated 0.41 0.30 - 0.70 IU/mL  Protime-INR     Status: None   Collection Time: 06/21/21  2:15 AM  Result Value Ref Range   Prothrombin Time 14.9 11.4 - 15.2 seconds   INR 1.2 0.8 - 1.2  Basic metabolic panel     Status: Abnormal   Collection Time: 06/21/21  2:15 AM  Result Value Ref Range   Sodium 135 135 - 145 mmol/L   Potassium  3.8 3.5 - 5.1 mmol/L   Chloride 103 98 - 111 mmol/L   CO2 27 22 - 32 mmol/L   Glucose, Bld 138 (H) 70 - 99 mg/dL   BUN 14 6 - 20 mg/dL   Creatinine, Ser 0.68 0.44 - 1.00 mg/dL   Calcium 8.6 (L) 8.9 - 10.3 mg/dL   GFR, Estimated >60 >60 mL/min   Anion gap 5 5 - 15  CBC     Status: Abnormal   Collection Time: 06/21/21  2:15 AM  Result Value Ref Range   WBC 13.2 (H) 4.0 - 10.5 K/uL   RBC 2.98 (L) 3.87 - 5.11 MIL/uL   Hemoglobin 9.1 (L) 12.0 - 15.0 g/dL   HCT 28.3 (L) 36.0 - 46.0 %   MCV 95.0 80.0 - 100.0 fL   MCH 30.5 26.0 - 34.0 pg   MCHC 32.2 30.0 - 36.0 g/dL   RDW 13.4 11.5 -  15.5 %   Platelets 175 150 - 400 K/uL   nRBC 0.0 0.0 - 0.2 %  Heparin level (unfractionated)     Status: None   Collection Time: 06/21/21  2:15 AM  Result Value Ref Range   Heparin Unfractionated 0.44 0.30 - 0.70 IU/mL     PHYSICAL EXAM:   Gen: awake and alert, pleasant. Morbidly obese. Air mattress in place. NAD, doing a crossword puzzle  Lungs: unlabored Ext:       Left Lower Extremity              Dressing clean, dry and intact    Dressings removed   All wounds are stable    No signs of infection    New mepilex dressings applied to higher risk incisions              Ext warm              Good perfusion distally              + DP pulse             DPN, SPN, TN sensory functions intact             EHL, FHL, lesser toe motor functions intact             Ankle flexion, extension, inversion eversion intact             Compartments are soft.  No pain out of proportion with passive stretching       Right Lower Extremity   Short leg splint removed   No significant swelling or ecchymosis to foot   Mild tenderness to midfoot but nothing significant  Motor and sensory functions intact  Good perfusion    + DP pulse  Ankle and knee are nontender    Assessment/Plan: 2 Days Post-Op     Anti-infectives (From admission, onward)    Start     Dose/Rate Route Frequency Ordered Stop   06/20/21  0000  vancomycin (VANCOCIN) IVPB 1000 mg/200 mL premix        1,000 mg 200 mL/hr over 60 Minutes Intravenous Every 12 hours 06/19/21 1513 06/20/21 0149   06/19/21 1336  vancomycin (VANCOCIN) powder  Status:  Discontinued          As needed 06/19/21 1337 06/19/21 1410   06/19/21 1130  vancomycin (VANCOREADY) IVPB 1500 mg/300 mL        1,500 mg 150 mL/hr over 120 Minutes Intravenous To ShortStay Surgical 06/19/21 1112 06/19/21 1239     .  POD/HD#: 39  56 year old female with numerous chronic medical comorbidities s/p fall with left proximal tibia and fibula fracture   -ground level fall              Morbid obesity    -Closed left proximal tibia and fibula fracture s/p IMN left tibia             Touchdown weightbearing left leg             Unrestricted motion of ankle and knee             Therapy evaluations             SW consult for SNF                          Given patient's size I do not feel that she will be able to mobilize easily  and will likely need skilled nursing center at discharge               Dressing changed today     Change as needed    [place] to clean leg with soap and waster                Ice and elevate for swelling and pain control   -Avulsion fractures right talus and navicular             not sure of acuity of these injuries given clinical exam   Foot has a lot of arthritic changes on xray as well  No real swelling or ecchymosis to suggest acute fracture   Would recommend post op shoe only when mobilizing    - Pain management:             Multimodal - ABL anemia/Hemodynamics             Stable             Monitor - Medical issues              Per primary - DVT/PE prophylaxis:             On chronic anticoagulation             Currently heparin bridge to Coumadin given subtherapeutic INR - ID:              Perioperative antibiotics completed    - Metabolic Bone Disease:             Check vitamin d levels             Given low energy fracture  recommend DEXA in next month or 2  - Activity:             As above   - Impediments to fracture healing:             Obesity              Increased fall risk               - Dispo:             Therapy evals             Ortho issues stable  SNF once bed available  Follow up with ortho in 2 weeks   Jari Pigg, PA-C [phone] (C) 06/21/2021, 11:28 AM  [facility] [place] [phone] [phone] Jenetta [phone] (F)    After 5pm and on the weekends please log on to Amion, go to orthopaedics and the look under the [facility] Call for the provider(s) on call. You can also call our office at [phone] and then follow the prompts to be connected to the call team.   Patient ID: Benita Gutter, female   DOB: 1965-02-01, 56 y.o.   MRN: [id number]

## 2021-06-21 NOTE — TOC Initial Note (Signed)
Transition of Care [facility]) - Initial/Assessment Note    Patient Details  Name: Beth Marshall MRN: [id number] Date of Birth: 1965/09/22  Transition [facility]) CM/SW Contact:    Joanne Chars, LCSW Phone Number: 06/21/2021, 11:03 AM  Clinical Narrative:   CSW met with pt regarding DC recommendation for SNF. Pt agreeable, choice document given, permission given to send out referral in hub.  Pt lives in [place] but parents are in [place] and would like SNF in [place] if possible. Permission given to speak with mother Binnie Rail and step father Retta Diones.  Pt lives alone, has [place] aide in place through [facility], 5 days per week, 3 hours per day, no weekends.  Pt is vaccinated for covid with one booster.  Passr goes to level 2. Referral sent out in hub for SNF.                 Expected Discharge Plan: Skilled Nursing Facility Barriers to Discharge: Continued Medical Work up, SNF Pending bed offer   Patient Goals and CMS Choice Patient states their goals for this hospitalization and ongoing recovery are:: get back on my feet CMS Medicare.gov Compare Post Acute Care list provided to:: Patient Choice offered to / list presented to : Patient  Expected Discharge Plan and Services Expected Discharge Plan: [place] In-house Referral: Clinical Social Work   Post Acute Care Choice: [place] Living arrangements for the past 2 months: [place]                                      Prior Living Arrangements/Services Living arrangements for the past 2 months: Single Family Home Lives with:: Self Patient language and need for interpreter reviewed:: Yes Do you feel safe going back to the place where you live?: Yes      Need for Family Participation in Patient Care: No (Comment) Care giver support system in place?: Yes (comment) Current home services: Homehealth aide (5 days/week 3 hours per day, not weekends) Criminal Activity/Legal  Involvement Pertinent to Current Situation/Hospitalization: No - Comment as needed  Activities of Daily Living Home Assistive Devices/Equipment: Walker (specify type) ADL Screening (condition at time of admission) Patient's cognitive ability adequate to safely complete daily activities?: Yes Is the patient deaf or have difficulty hearing?: No Does the patient have difficulty seeing, even when wearing glasses/contacts?: No Does the patient have difficulty concentrating, remembering, or making decisions?: No Patient able to express need for assistance with ADLs?: Yes Does the patient have difficulty dressing or bathing?: Yes Independently performs ADLs?: No Communication: Independent Dressing (OT): Needs assistance Is this a change from baseline?: Pre-admission baseline Grooming: Needs assistance Is this a change from baseline?: Pre-admission baseline Feeding: Independent Bathing: Needs assistance Is this a change from baseline?: Pre-admission baseline Toileting: Needs assistance Is this a change from baseline?: Pre-admission baseline In/Out Bed: Needs assistance Is this a change from baseline?: Pre-admission baseline Walks in Home: Needs assistance Is this a change from baseline?: Pre-admission baseline Does the patient have difficulty walking or climbing stairs?: Yes Weakness of Legs: Both Weakness of Arms/Hands: None  Permission Sought/Granted Permission sought to share information with : Family Supports Permission granted to share information with : Yes, Verbal Permission Granted  Share Information with NAME: mother Inez Catalina, step father Ron  Permission granted to share info w AGENCY: SNF  Emotional Assessment Appearance:: Appears stated age Attitude/Demeanor/Rapport: Engaged Affect (typically observed): Appropriate, Pleasant Orientation: : Oriented to Self, Oriented to Place, Oriented to  Time, Oriented to Situation Alcohol / Substance Use: Not Applicable Psych  Involvement: No (comment)  Admission diagnosis:  Ankle fracture [S82.899A] Closed nondisplaced fracture of navicular bone of right foot, initial encounter [S92.254A] Closed fracture of proximal end of left tibia and fibula, initial encounter [S82.102A, S82.832A] Closed nondisplaced fracture of dome of right talus, initial encounter [S92.144A] Patient Active Problem List   Diagnosis Date Noted   Ankle fracture 06/19/2021   Tibia/fibula fracture, left, closed, initial encounter 06/19/2021   Fracture of lower extremity 06/19/2021   Atrial fibrillation (HCC) 05/06/2021   Moderate episode of recurrent major depressive disorder ([place]) 08/02/2018   History of DVT (deep vein thrombosis) 03/15/2018   Primary osteoarthritis of both knees 08/30/2017   Arthritis of right knee 12/20/2016   Chronic deep vein thrombosis (DVT) of lower extremity ([place]) 10/27/2015   Encounter for screening mammogram for breast cancer 10/27/2015   Factor V Leiden mutation ([place]) 10/27/2015   High risk medication use 07/25/2015   Migraine without aura and without status migrainosus, not intractable 06/25/2015   Hypokalemia 05/16/2015   Primary osteoarthritis of left knee 04/22/2015   Elevated blood pressure reading 03/20/2015   Long term (current) use of anticoagulants 03/20/2015   Edema of extremities 02/18/2015   Dizziness 02/18/2015   Back pain 02/18/2015   Abnormal coagulation profile 02/18/2015   Generalized anxiety disorder 02/18/2015   Gastroesophageal reflux disease 02/18/2015   Lesion of ovary 02/18/2015   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   Insomnia 02/18/2015   Impaired fasting glucose 02/18/2015   OSA (obstructive sleep apnea) 02/18/2015   Obesity, morbid, BMI 50 or higher ([place]) 02/18/2015   Lipoma 02/18/2015   Shortness of breath 02/18/2015   Protein S deficiency ([place]) 02/18/2015   Osteoarthritis 02/18/2015   Calculus of gallbladder with cholecystitis and obstruction 06/23/2014   PCP:  [place] Hickman,  FNP Pharmacy:   [id number] [place], [place] - [id number] [place]Phone: [phone]: [address]- [address] [place]: [phone]: [place]Phone: [phone]: [phone]    Social Determinants of Health (SDOH) Interventions    Readmission Risk Interventions     View : No data to display.

## 2021-06-21 NOTE — Progress Notes (Signed)
    RE:  Beth Marshall       Date of Birth:  [birth year]     Date:   06/21/21       To Whom It May Concern:  Please be advised that the above-named patient will require a short-term nursing home stay - anticipated 30 days or less for rehabilitation and strengthening.  The plan is for return home.                 MD signature                Date

## 2021-06-22 ENCOUNTER — Encounter (HOSPITAL_COMMUNITY): Payer: Self-pay | Admitting: Student

## 2021-06-22 ENCOUNTER — Inpatient Hospital Stay (HOSPITAL_COMMUNITY): Payer: Medicare Other

## 2021-06-22 DIAGNOSIS — D62 Acute posthemorrhagic anemia: Secondary | ICD-10-CM

## 2021-06-22 DIAGNOSIS — E559 Vitamin D deficiency, unspecified: Secondary | ICD-10-CM

## 2021-06-22 DIAGNOSIS — S8292XA Unspecified fracture of left lower leg, initial encounter for closed fracture: Secondary | ICD-10-CM | POA: Diagnosis not present

## 2021-06-22 DIAGNOSIS — S301XXA Contusion of abdominal wall, initial encounter: Secondary | ICD-10-CM

## 2021-06-22 LAB — PREPARE RBC (CROSSMATCH)

## 2021-06-22 LAB — PROTIME-INR
INR: 1.1 (ref 0.8–1.2)
Prothrombin Time: 14.3 seconds (ref 11.4–15.2)

## 2021-06-22 LAB — ABO/RH: ABO/RH(D): A POS

## 2021-06-22 LAB — HEPATIC FUNCTION PANEL
ALT: 17 U/L (ref 0–44)
AST: 16 U/L (ref 15–41)
Albumin: 2.4 g/dL — ABNORMAL LOW (ref 3.5–5.0)
Alkaline Phosphatase: 54 U/L (ref 38–126)
Bilirubin, Direct: 0.1 mg/dL (ref 0.0–0.2)
Indirect Bilirubin: 0.4 mg/dL (ref 0.3–0.9)
Total Bilirubin: 0.5 mg/dL (ref 0.3–1.2)
Total Protein: 5.3 g/dL — ABNORMAL LOW (ref 6.5–8.1)

## 2021-06-22 LAB — CBC
HCT: 25.1 % — ABNORMAL LOW (ref 36.0–46.0)
Hemoglobin: 8.1 g/dL — ABNORMAL LOW (ref 12.0–15.0)
MCH: 30.9 pg (ref 26.0–34.0)
MCHC: 32.3 g/dL (ref 30.0–36.0)
MCV: 95.8 fL (ref 80.0–100.0)
Platelets: 172 10*3/uL (ref 150–400)
RBC: 2.62 MIL/uL — ABNORMAL LOW (ref 3.87–5.11)
RDW: 13.4 % (ref 11.5–15.5)
WBC: 9.7 10*3/uL (ref 4.0–10.5)
nRBC: 0 % (ref 0.0–0.2)

## 2021-06-22 LAB — HEMOGLOBIN AND HEMATOCRIT, BLOOD
HCT: 28.2 % — ABNORMAL LOW (ref 36.0–46.0)
Hemoglobin: 9.1 g/dL — ABNORMAL LOW (ref 12.0–15.0)

## 2021-06-22 LAB — HEPARIN LEVEL (UNFRACTIONATED): Heparin Unfractionated: 0.41 IU/mL (ref 0.30–0.70)

## 2021-06-22 LAB — LIPASE, BLOOD: Lipase: 30 U/L (ref 11–51)

## 2021-06-22 LAB — VITAMIN D 25 HYDROXY (VIT D DEFICIENCY, FRACTURES): Vit D, 25-Hydroxy: 14.99 ng/mL — ABNORMAL LOW (ref 30–100)

## 2021-06-22 MED ORDER — IOHEXOL 9 MG/ML PO SOLN
ORAL | Status: AC
  Administered 2021-06-22: 500 mL
  Filled 2021-06-22: qty 1000

## 2021-06-22 MED ORDER — VITAMIN D (ERGOCALCIFEROL) 1.25 MG (50000 UNIT) PO CAPS
50000.0000 [IU] | ORAL_CAPSULE | ORAL | Status: DC
  Administered 2021-06-22: 50000 [IU] via ORAL
  Filled 2021-06-22: qty 1

## 2021-06-22 MED ORDER — WARFARIN SODIUM 5 MG PO TABS
10.0000 mg | ORAL_TABLET | Freq: Once | ORAL | Status: AC
  Administered 2021-06-22: 10 mg via ORAL
  Filled 2021-06-22: qty 2

## 2021-06-22 MED ORDER — PANTOPRAZOLE SODIUM 40 MG PO TBEC
40.0000 mg | DELAYED_RELEASE_TABLET | Freq: Every day | ORAL | Status: DC
  Administered 2021-06-22 – 2021-06-26 (×5): 40 mg via ORAL
  Filled 2021-06-22 (×5): qty 1

## 2021-06-22 MED ORDER — VITAMIN D (ERGOCALCIFEROL) 1.25 MG (50000 UNIT) PO CAPS: 50000.0000 [IU] | ORAL_CAPSULE | ORAL | 0 refills | Status: AC

## 2021-06-22 MED ORDER — OXYCODONE-ACETAMINOPHEN 5-325 MG PO TABS: 1.0000 | ORAL_TABLET | ORAL | 0 refills | Status: AC | PRN

## 2021-06-22 MED ORDER — SODIUM CHLORIDE 0.9% IV SOLUTION: Freq: Once | INTRAVENOUS | Status: DC

## 2021-06-22 MED ORDER — IOHEXOL 300 MG/ML  SOLN
100.0000 mL | Freq: Once | INTRAMUSCULAR | Status: AC | PRN
Start: 2021-06-22 — End: 2021-06-22
  Administered 2021-06-22: 100 mL via INTRAVENOUS

## 2021-06-22 NOTE — Progress Notes (Signed)
PROGRESS NOTE Beth PANGILINAN  JYN:[id number] DOB: 27-Dec-1965 DOA: 06/18/2021 PCP: [place] Hickman, FNP   Brief Narrative/Hospital Course: 56 y.o. 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 She lives alone and for the past week she has noticed increasing left foot pain.  Pain became so severe about 2 days PTA that she could barely walk.  She got up out of bed and fell.  She presented to [facility] PTA but reportedly had no significant findings.  Then earlier on 06/18/21  she had difficulty getting up from the toilet and then fell back onto the commode when she tried to stand up.  She then called EMS and after they stood her up she fell again and heard her knee pop. At baseline she requires assistant with a walker and has chronic leg pain. In the ED, she was found to have fractures in her left lateral, medial malleolus, comminuted displaced fracture of the left tibial shaft.  She also had avulsion fracture of the right navicular. ED PA discussed with orthopedic and admitted.  Patient underwent IM nailing of left proximal tibia 6/2 after vitamin K for coagulopathy.  She was resumed back on heparin and Coumadin next day given high risk for VTE and A-fib history    Subjective: Seen and examined this morning resting comfortably she has no new complaints, has cannot find out why it was placed its been several days pain on her left leg Overnight afebrile, H&H continues to downtrend at 8.1 Leukocytosis resolved INR 1.1  Assessment and Plan: Principal Problem:   Fracture of lower extremity Active Problems:   History of DVT (deep vein thrombosis)   Factor V Leiden mutation (HCC)   Obesity, morbid, BMI 50 or higher ([place])   Protein S deficiency ([place])   Atrial fibrillation (HCC)   Tibia/fibula fracture, left, closed, initial  encounter   Right foot sprain, initial encounter   Acute postoperative anemia due to expected blood loss   Vitamin D deficiency   Fracture of lower extremity-left tibia: s/p IM nailing of left proximal tibia Dr Doreatha Martin 6/2.  Continue pain control, with scheduled tramadol every 6 hours, Dilaudid , cont PT OT dressing, DVT prophylaxis ( heparin-coumadin).  Appreciate orthopedics input  Acute postoperative anemia due to expected blood loss in the setting of fracture/coagulopathy due to Coumadin.H&H continues to downtrend at 8.1 unfortunately she needs to be on anticoagulation.  Discussed with patient's risk benefits alternatives of transfusion she wants to proceed with 1 unit PRBC transfusion, we will order Recent Labs  Lab 06/18/21 2227 06/19/21 0327 06/20/21 0306 06/21/21 0215 06/22/21 0055  HGB 12.8 13.4 9.6* 9.1* 8.1*  HCT 41.1 43.0 29.2* 28.3* 25.1*      Vitamin D deficiency start high-dose replacement.  Right foot fracture unsure if new or old, advised cam boot WBAT   History of DVT Factor V Leiden mutation Protein S deficiency Paroxysmal atrial fibrillation- in NSR now: Patient received vitamin for reversal preoperatively.INR  low- cont heparin bridge/coumadin- pt with VTE hx/ factor V/ protein S deficiency and A-fib history-pharmacy monitoring and dosing Coumadin, will monitor hemoglobin closely.  Appreciate pharmacy input Recent Labs  Lab 06/19/21 0327 06/19/21 1828 06/20/21 0306 06/21/21 0215 06/22/21 0055  INR 2.7* 1.7* 1.4* 1.2 1.1    Obesity, morbid, BMI  71-currently trying to lose weight  OSA intolerant  to CPAP. [place] bedtime.  DVT prophylaxis: SCDs Start: 06/19/21 1514 Code Status:   Code Status: Full Code Family Communication: plan of care discussed with patient at bedside. Patient status is: Inpatient because of ongoing management of post tibia fracture Level of care: Telemetry Medical   Dispo: The patient is from: HOME            Anticipated disposition:  SNF once INR therapeutic   Mobility Assessment (last 72 hours)     Mobility Assessment     Row Name 06/20/21 2100 06/20/21 1538 06/19/21 1545       Does patient have an order for bedrest or is patient medically unstable No - Continue assessment -- No - Continue assessment     What is the highest level of mobility based on the progressive mobility assessment? Level 2 ([place]) - Balance while sitting on edge of bed and cannot stand Level 2 ([place]) - Balance while sitting on edge of bed and cannot stand Level 2 ([place]) - Balance while sitting on edge of bed and cannot stand     Is the above level different from baseline mobility prior to current illness? Yes - Recommend PT order -- Yes - Recommend PT order               Objective: Vitals last 24 hrs: Vitals:   06/21/21 1527 06/21/21 1951 06/22/21 0500 06/22/21 0740  BP: 109/71 (!) 112/57 (!) 107/48 (!) 106/53  Pulse: 79 69 73 74  Resp:  '16 17 18  '$ Temp: 98.4 F (36.9 C) 97.8 F (36.6 C) 98 F (36.7 C) 98.5 F (36.9 C)  TempSrc: Oral Oral Oral Oral  SpO2: 100% 97%  98%  Weight:      Height:       Weight change:   Physical Examination: General exam: AAox3, morbidly obese older than stated age, weak appearing. HEENT:Oral mucosa moist, Ear/Nose WNL grossly, dentition normal. Respiratory system: bilaterally diminished, no use of accessory muscle Cardiovascular system: S1 & S2 +, No JVD,. Gastrointestinal system: Abdomen soft,NT,ND,BS+ Nervous System:Alert, awake, moving extremities and grossly nonfocal Extremities: LE ankle edema mild, left TMA incision sites with dressing intact Skin: No rashes,no icterus. MSK: Normal muscle bulk,tone, power   Medications reviewed:  Scheduled Meds:  docusate sodium  100 mg Oral BID   furosemide  40 mg Oral Daily   loratadine  10 mg Oral Daily   montelukast  10 mg Oral Daily   potassium chloride  10 mEq Oral BID   traMADol  50 mg Oral Q6H   triamcinolone cream   Topical BID    venlafaxine XR  75 mg Oral Daily   Vitamin D (Ergocalciferol)  50,000 Units Oral Q7 days   warfarin  10 mg Oral ONCE-1600   Warfarin - Pharmacist Dosing Inpatient   Does not apply q1600   Continuous Infusions:  sodium chloride 75 mL/hr at 06/19/21 1638   heparin 1,800 Units/hr (06/22/21 0549)    Diet Order             Diet Heart Room service appropriate? Yes; Fluid consistency: Thin  Diet effective now                  Intake/Output Summary (Last 24 hours) at 06/22/2021 0822 Last data filed at 06/22/2021 0549 Gross per 24 hour  Intake 660 ml  Output 2950 ml  Net -2290 ml    Net IO Since Admission: -1,802.18 mL [06/22/21 0822]  Wt Readings from Last 3 Encounters:  06/19/21 (!) 176.9 kg  05/07/14 (!) 181.4 kg     Unresulted Labs (From admission, onward)     Start     Ordered   06/22/21 0500  CBC  Daily,   R     Question:  Specimen collection method  Answer:  Lab=Lab collect   06/21/21 0851   06/21/21 0500  Heparin level (unfractionated)  Daily,   R     Question:  Specimen collection method  Answer:  Lab=Lab collect   06/20/21 1337   06/19/21 0032  Protime-INR  Daily,   R      06/19/21 0033          Data Reviewed: I have personally reviewed following labs and imaging studies CBC: Recent Labs  Lab 06/18/21 2227 06/19/21 0327 06/20/21 0306 06/21/21 0215 06/22/21 0055  WBC 10.2 11.6* 9.8 13.2* 9.7  NEUTROABS 7.0  --   --   --   --   HGB 12.8 13.4 9.6* 9.1* 8.1*  HCT 41.1 43.0 29.2* 28.3* 25.1*  MCV 98.3 98.2 94.8 95.0 95.8  PLT 246 236 182 175 710   Basic Metabolic Panel: Recent Labs  Lab 06/18/21 2227 06/20/21 0306 06/21/21 0215  NA 136 137 135  K 4.5 3.7 3.8  CL 103 104 103  CO2 '23 26 27  '$ GLUCOSE 151* 193* 138*  BUN '12 10 14  '$ CREATININE 1.11* 0.62 0.68  CALCIUM 9.2 8.6* 8.6*   GFR: Estimated Creatinine Clearance: 129.4 mL/min (by C-G formula based on SCr of 0.68 mg/dL). Liver Function Tests: Recent Labs  Lab 06/18/21 2227  AST 33  ALT  20  ALKPHOS 85  BILITOT 1.2  PROT 6.7  ALBUMIN 3.7   No results for input(s): LIPASE, AMYLASE in the last 168 hours. No results for input(s): AMMONIA in the last 168 hours. Coagulation Profile: Recent Labs  Lab 06/19/21 0327 06/19/21 1828 06/20/21 0306 06/21/21 0215 06/22/21 0055  INR 2.7* 1.7* 1.4* 1.2 1.1   BNP (last 3 results) No results for input(s): PROBNP in the last 8760 hours. HbA1C: No results for input(s): HGBA1C in the last 72 hours. CBG: No results for input(s): GLUCAP in the last 168 hours. Lipid Profile: No results for input(s): CHOL, HDL, LDLCALC, TRIG, CHOLHDL, LDLDIRECT in the last 72 hours. Thyroid Function Tests: No results for input(s): TSH, T4TOTAL, FREET4, T3FREE, THYROIDAB in the last 72 hours. Sepsis Labs: No results for input(s): PROCALCITON, LATICACIDVEN in the last 168 hours.  Recent Results (from the past 240 hour(s))  Surgical pcr screen     Status: None   Collection Time: 06/19/21 11:21 AM   Specimen: Nasal Mucosa; Nasal Swab  Result Value Ref Range Status   MRSA, PCR NEGATIVE NEGATIVE Final   Staphylococcus aureus NEGATIVE NEGATIVE Final    Comment: (NOTE) The Xpert SA Assay (FDA approved for NASAL specimens in patients 32 years of age and older), is one component of a comprehensive surveillance program. It is not intended to diagnose infection nor to guide or monitor treatment. Performed at [facility], [place] [address] [place], [place] [address]     Antimicrobials: Anti-infectives (From admission, onward)    Start     Dose/Rate Route Frequency Ordered Stop   06/20/21 0000  vancomycin (VANCOCIN) IVPB 1000 mg/200 mL premix        1,000 mg 200 mL/hr over 60 Minutes Intravenous Every 12 hours 06/19/21 1513 06/20/21 0149   06/19/21 1336  vancomycin (VANCOCIN) powder  Status:  Discontinued  As needed 06/19/21 1337 06/19/21 1410   06/19/21 1130  vancomycin (VANCOREADY) IVPB 1500 mg/300 mL        1,500 mg 150 mL/hr over  120 Minutes Intravenous To ShortStay Surgical 06/19/21 1112 06/19/21 1239      Culture/Microbiology No results found for: SDES, SPECREQUEST, CULT, REPTSTATUS  Other culture-see note  Radiology Studies: No results found.   LOS: 3 days   Antonieta Pert, MD [facility]  06/22/2021, 8:22 AM

## 2021-06-22 NOTE — Discharge Instructions (Signed)
Orthopaedic Trauma Service Discharge Instructions   General Discharge Instructions  WEIGHT BEARING STATUS: Left leg - touchdown weightbearing, Right leg - weightbearing as tolerated with hard sole shoe in place.  RANGE OF MOTION/ACTIVITY: ok for unrestricted range of motion bilateral lower extremities  Wound Care: Incisions can be left open to air if there is no drainage. If incision continues to have drainage, follow wound care instructions below. Okay to shower if no drainage from incisions.  DVT/PE prophylaxis: Coumadin  Diet: as you were eating previously.  Can use over the counter stool softeners and bowel preparations, such as Miralax, to help with bowel movements.  Narcotics can be constipating.  Be sure to drink plenty of fluids  PAIN MEDICATION USE AND EXPECTATIONS  You have likely been given narcotic medications to help control your pain.  After a traumatic event that results in an fracture (broken bone) with or without surgery, it is ok to use narcotic pain medications to help control one's pain.  We understand that everyone responds to pain differently and each individual patient will be evaluated on a regular basis for the continued need for narcotic medications. Ideally, narcotic medication use should last no more than 6-8 weeks (coinciding with fracture healing).   As a patient it is your responsibility as well to monitor narcotic medication use and report the amount and frequency you use these medications when you come to your office visit.   We would also advise that if you are using narcotic medications, you should take a dose prior to therapy to maximize you participation.  IF YOU ARE ON NARCOTIC MEDICATIONS IT IS NOT PERMISSIBLE TO OPERATE A MOTOR VEHICLE (MOTORCYCLE/CAR/TRUCK/MOPED) OR HEAVY MACHINERY DO NOT MIX NARCOTICS WITH OTHER CNS (CENTRAL NERVOUS SYSTEM) DEPRESSANTS SUCH AS ALCOHOL   STOP SMOKING OR USING NICOTINE PRODUCTS!!!!  As discussed nicotine severely  impairs your body's ability to heal surgical and traumatic wounds but also impairs bone healing.  Wounds and bone heal by forming microscopic blood vessels (angiogenesis) and nicotine is a vasoconstrictor (essentially, shrinks blood vessels).  Therefore, if vasoconstriction occurs to these microscopic blood vessels they essentially disappear and are unable to deliver necessary nutrients to the healing tissue.  This is one modifiable factor that you can do to dramatically increase your chances of healing your injury.    (This means no smoking, no nicotine gum, patches, etc)  DO NOT USE NONSTEROIDAL ANTI-INFLAMMATORY DRUGS (NSAID'S)  Using products such as Advil (ibuprofen), Aleve (naproxen), Motrin (ibuprofen) for additional pain control during fracture healing can delay and/or prevent the healing response.  If you would like to take over the counter (OTC) medication, Tylenol (acetaminophen) is ok.  However, some narcotic medications that are given for pain control contain acetaminophen as well. Therefore, you should not exceed more than 4000 mg of tylenol in a day if you do not have liver disease.  Also note that there are may OTC medicines, such as cold medicines and allergy medicines that my contain tylenol as well.  If you have any questions about medications and/or interactions please ask your doctor/PA or your pharmacist.      ICE AND ELEVATE INJURED/OPERATIVE EXTREMITY  Using ice and elevating the injured extremity above your heart can help with swelling and pain control.  Icing in a pulsatile fashion, such as 20 minutes on and 20 minutes off, can be followed.    Do not place ice directly on skin. Make sure there is a barrier between to skin and the ice  pack.    Using frozen items such as frozen peas works well as the conform nicely to the are that needs to be iced.  USE AN ACE WRAP OR TED HOSE FOR SWELLING CONTROL  In addition to icing and elevation, Ace wraps or TED hose are used to help limit  and resolve swelling.  It is recommended to use Ace wraps or TED hose until you are informed to stop.    When using Ace Wraps start the wrapping distally (farthest away from the body) and wrap proximally (closer to the body)   Example: If you had surgery on your leg or thing and you do not have a splint on, start the ace wrap at the toes and work your way up to the thigh        If you had surgery on your upper extremity and do not have a splint on, start the ace wrap at your fingers and work your way up to the upper arm   [place]: [phone]   VISIT OUR WEBSITE FOR ADDITIONAL INFORMATION: [email or web address]   Discharge Wound Care Instructions  Do NOT apply any ointments, solutions or lotions to pin sites or surgical wounds.  These prevent needed drainage and even though solutions like hydrogen peroxide kill bacteria, they also damage cells lining the pin sites that help fight infection.  Applying lotions or ointments can keep the wounds moist and can cause them to breakdown and open up as well. This can increase the risk for infection. When in doubt call the office.  If any drainage is noted, use foam dressing (Mepilex)  Once the incision is completely dry and without drainage, it may be left open to air out.  Showering may begin 36-48 hours later.  Cleaning gently with soap and water.

## 2021-06-22 NOTE — Care Management Important Message (Signed)
Important Message  Patient Details  Name: Beth Marshall MRN: [id number] Date of Birth: 29-Apr-1965   Medicare Important Message Given:  Yes     Orbie Pyo 06/22/2021, 3:55 PM

## 2021-06-22 NOTE — TOC Progression Note (Signed)
Transition of Care [facility]) - Progression Note    Patient Details  Name: Beth Marshall MRN: [id number] Date of Birth: 04-24-1965  Transition of Care [facility]) CM/SW Contact  Joanne Chars, LCSW Phone Number: 06/22/2021, 4:01 PM  Clinical Narrative:    No bed offers.  Search expanded.  Level 2 passr docs uploaded in [place] [facility]   Expected Discharge Plan: [place] Barriers to Discharge: Continued Medical Work up, SNF Pending bed offer  Expected Discharge Plan and Services Expected Discharge Plan: [place] In-house Referral: Clinical Social Work   Post Acute Care Choice: [place] Living arrangements for the past 2 months: Single Family Home                                       Social Determinants of Health (SDOH) Interventions    Readmission Risk Interventions     View : No data to display.

## 2021-06-22 NOTE — Progress Notes (Signed)
Pt on anticoagulation for factor V Leiden and hx of DVT had CT abd/pelvis this evening for RLQ pain and is found to have rectus sheath hematoma.   There is no active extravasation seen on CT and patient is stable. She is currently on IV heparin bridge after recent surgery and INR was 1.1 today.   Plan to hold heparin for now and repeat H&H.

## 2021-06-22 NOTE — Care Management Important Message (Signed)
Important Message  Patient Details  Name: Beth Marshall MRN: [id number] Date of Birth: 08-23-1965   Medicare Important Message Given:  Yes     Orbie Pyo 06/22/2021, 3:52 PM

## 2021-06-22 NOTE — Care Management Important Message (Signed)
Important Message  Patient Details  Name: Beth Marshall MRN: [id number] Date of Birth: 02-20-1965   Medicare Important Message Given:  Yes     Orbie Pyo 06/22/2021, 3:53 PM

## 2021-06-22 NOTE — Plan of Care (Signed)
  Problem: Education: Goal: Knowledge of General Education information will improve Description Including pain rating scale, medication(s)/side effects and non-pharmacologic comfort measures Outcome: Progressing   

## 2021-06-22 NOTE — Progress Notes (Signed)
Orthopaedic Trauma Progress Note  SUBJECTIVE: Doing ok this morning, currently eating her breakfast. Notes her left leg is sore but pain is fairly manageable at rest.  She was able to get to [facility] with therapies on Saturday.  Did not have any therapies yesterday.  Is hoping to continue making progress during therapy sessions today.  No chest pain. No SOB. No nausea/vomiting. No other complaints.  Understands she will require SNF at discharge. Would like to go to [facility] in [place], [place] to be closer to her immediate family if possible  OBJECTIVE:  Vitals:   06/22/21 0500 06/22/21 0740  BP: (!) 107/48 (!) 106/53  Pulse: 73 74  Resp: 17 18  Temp: 98 F (36.7 C) 98.5 F (36.9 C)  SpO2:  98%    General: Sitting up in bed eating breakfast, no acute distress Respiratory: No increased work of breathing.  LLE: Dressings clean, dry, intact.  No swelling throughout the lower leg as expected.  Ecchymosis to proximal anterior tibia over fracture site.  Tenderness with palpation over the knee and throughout the lower leg as expected.  Tolerates very gentle ankle motion but notes some discomfort throughout the lower leg with this.  Extremity warm and well-perfused.  + DP pulse  RLE:  No significant swelling/ecchymosis to foot. Mildly tender to midfoot but nothing significant. Nontender through ankle and knee. Motor and sensory functions intact. Foiot warm and well perfused. + DP pulse     IMAGING: Stable post op imaging.   LABS:  Results for orders placed or performed during the hospital encounter of 06/18/21 (from the past 24 hour(s))  Protime-INR     Status: None   Collection Time: 06/22/21 12:55 AM  Result Value Ref Range   Prothrombin Time 14.3 11.4 - 15.2 seconds   INR 1.1 0.8 - 1.2  Heparin level (unfractionated)     Status: None   Collection Time: 06/22/21 12:55 AM  Result Value Ref Range   Heparin Unfractionated 0.41 0.30 - 0.70 IU/mL  CBC     Status:  Abnormal   Collection Time: 06/22/21 12:55 AM  Result Value Ref Range   WBC 9.7 4.0 - 10.5 K/uL   RBC 2.62 (L) 3.87 - 5.11 MIL/uL   Hemoglobin 8.1 (L) 12.0 - 15.0 g/dL   HCT 25.1 (L) 36.0 - 46.0 %   MCV 95.8 80.0 - 100.0 fL   MCH 30.9 26.0 - 34.0 pg   MCHC 32.3 30.0 - 36.0 g/dL   RDW 13.4 11.5 - 15.5 %   Platelets 172 150 - 400 K/uL   nRBC 0.0 0.0 - 0.2 %  VITAMIN D 25 Hydroxy (Vit-D Deficiency, Fractures)     Status: Abnormal   Collection Time: 06/22/21 12:55 AM  Result Value Ref Range   Vit D, 25-Hydroxy 14.99 (L) 30 - 100 ng/mL    ASSESSMENT: SHIRLEYMAE HAUTH is a 56 y.o. female, 3 Days Post-Op s/p INTRAMEDULLARY NAIL LEFT TIBIA NON-OP MANAGEMENT RIGHT NAVICULAR AVULSION FRACTURE  CV/Blood loss: Acute blood loss anemia. Hgb continues to trend downward, 8.1 this AM.   PLAN: Weightbearing: TDWB LLE. WBAT RLE ROM: Ok for unrestricted motion BLE  Incisional and dressing care: Change LLE dressings as needed Showering: Okay to begin showering, incisions may get wet Orthopedic device(s):  Hard sole shoe RLE when ambulating   Pain management:  1. Tylenol 325-650 mg q 6 hours PRN 2. Flexeril 5 mg TID 3. Percocet 5-325 mg q 4 hours PRN 4.  Dilaudid 0.5-1 mg q 3 hours PRN 5. Tramadol 50 mg q 6 hours VTE prophylaxis:  Heparin bridge to Coumadin , SCDs ID: Vancomycin post op completed Foley/Lines:  No foley, KVO IVFs Impediments to Fracture Healing: Vitamin D level 14, started on D2 supplementation Dispo: PT/OT evaluation ongoing.  Currently recommending SNF.  Continue to monitor CBC.  Okay for discharge from ortho standpoint once cleared by medicine team and therapies.  I have signed and placed discharge Rx for pain medication and vitamin D supplementation in patient's chart  D/C recommendations: - Percocet for pain control - Continue home dose Coumadin for DVT prophylaxis - Continue vitamin D2 supplementation 50,000 units q 7 days x 5 weeks   Follow - up plan: 2 weeks after  discharge for wound check and repeat x-rays   Contact information:  Katha Hamming MD, Rushie Nyhan PA-C. After hours and holidays please check [email or web address] for group call information for [facility]   Gwinda Passe, PA-C [phone] (office) [email or web address]

## 2021-06-22 NOTE — Progress Notes (Signed)
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, atrial fibrillation  Allergies  Allergen Reactions   Cephalexin Other (See Comments)    unknown unknown    Codeine Nausea And Vomiting   Diphenhydramine Hcl Itching   Ranitidine Rash    Patient Measurements: Height: '5\' 2"'$  (157.5 cm) Weight: (!) 176.9 kg (390 lb) IBW/kg (Calculated) : 50.1 HEPARIN DW (KG): 105 kg   Vital Signs: Temp: 98 F (36.7 C) (06/05 0500) Temp Source: Oral (06/05 0500) BP: 107/48 (06/05 0500) Pulse Rate: 73 (06/05 0500)  Labs: Recent Labs    06/20/21 0306 06/20/21 1216 06/20/21 2007 06/21/21 0215 06/22/21 0055  HGB 9.6*  --   --  9.1* 8.1*  HCT 29.2*  --   --  28.3* 25.1*  PLT 182  --   --  175 172  LABPROT 16.7*  --   --  14.9 14.3  INR 1.4*  --   --  1.2 1.1  HEPARINUNFRC  --    < > 0.41 0.44 0.41  CREATININE 0.62  --   --  0.68  --    < > = values in this interval not displayed.     Estimated Creatinine Clearance: 129.4 mL/min (by C-G formula based on SCr of 0.68 mg/dL).   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 ([place]) 10/27/2015   Dizziness 02/18/2015   Edema of extremities 02/18/2015   Elevated blood pressure reading 03/20/2015   Encounter for screening mammogram for breast cancer 10/27/2015   Factor V Leiden mutation ([place]) 10/27/2015   Gastroesophageal reflux disease 02/18/2015   Generalized anxiety disorder 02/18/2015   High risk medication use 07/25/2015   History of DVT (deep vein thrombosis) 03/15/2018   Hypokalemia 05/16/2015   Impaired fasting glucose 02/18/2015   Insomnia 02/18/2015   Intrinsic  asthma 02/18/2015   Lesion of ovary 02/18/2015   Lipoma 02/18/2015   Long term current use of anticoagulant therapy 03/20/2015   Migraine without aura and without status migrainosus, not intractable 06/25/2015   Moderate episode of recurrent major depressive disorder ([place]) 08/02/2018   Obesity, morbid, BMI 50 or higher ([place]) 02/18/2015   OSA (obstructive sleep apnea) 02/18/2015   Osteoarthritis 02/18/2015   Primary osteoarthritis of both knees 08/30/2017   Primary osteoarthritis of left knee 04/22/2015   Protein S deficiency ([place]) 02/18/2015   Shortness of breath 02/18/2015   Tachycardia 07/01/2015   Tachycardia 07/01/2015    Medications:  Medications Prior to Admission  Medication Sig Dispense Refill Last Dose   albuterol (PROVENTIL HFA;VENTOLIN HFA) 108 (90 BASE) MCG/ACT inhaler Inhale 1 puff into the lungs every 6 (six) hours as needed for wheezing or shortness of breath.   unknown   augmented betamethasone dipropionate (DIPROLENE-AF) 0.05 % cream Apply 1 application. topically 2 (two) times daily.   06/17/2021   BELSOMRA 10 MG TABS Take 1 tablet by mouth at bedtime as needed.   unknown   budesonide-formoterol (SYMBICORT) 160-4.5 MCG/ACT inhaler Inhale 2 puffs into the lungs daily.   unknown   calcipotriene (DOVONOX) 0.005 % cream Apply 1 application. topically 2 (two) times daily.   06/17/2021  cetirizine (ZYRTEC) 10 MG tablet Take 1 tablet by mouth daily.   06/17/2021   cyclobenzaprine (FLEXERIL) 5 MG tablet Take 5 mg by mouth 3 (three) times daily as needed for muscle spasms.   06/17/2021   diclofenac Sodium (VOLTAREN) 1 % GEL Apply 2-3 g topically as needed for pain.   06/17/2021   furosemide (LASIX) 40 MG tablet Take 40 mg by mouth daily.   06/17/2021   montelukast (SINGULAIR) 10 MG tablet Take 1 tablet by mouth daily.   06/17/2021   oxyCODONE-acetaminophen (PERCOCET/ROXICET) 5-325 MG tablet Take by mouth.   06/18/2021   potassium chloride (K-DUR) 10 MEQ tablet Take 10 mEq by mouth 2 (two) times daily.    06/17/2021   SUMAtriptan (IMITREX) 100 MG tablet Take 1 tablet by mouth as needed for migraine.   06/16/2021   traMADol (ULTRAM) 50 MG tablet Take 1 tablet by mouth as needed for pain.   06/18/2021   venlafaxine XR (EFFEXOR-XR) 75 MG 24 hr capsule Take 1 capsule by mouth daily.   06/17/2021   warfarin (COUMADIN) 5 MG tablet Take 1 1/2 tablets daily or as directed by [facility]. PLEASE KEEP UPCOMING CARDIOLOGY APPT FOR FUTURE REFILLS. 50 tablet 0 06/18/2021 at 4:30pm    Assessment: Pt on warfarin PTA for history of DVT with factor V Leiden and protein S deficiency, and atrial fibrillation. Admission INR 2.7. Reversed with vit K '5mg'$  IV 6/2 in anticipation of OR for IM nailing. CBC ok on admission.  Home dose: 7.'5mg'$  daily - last 6/1  Pt now s/p OR and continues on heparin bridge until therapeutic INR with warfarin.  Heparin level therapeutic on 1800 units/hr.  INR remains subtherapeutic at 1.1 as expected after Vit K reversal.  Will increase warfarin dose again slightly to overcome resistance.   Goal of Therapy:  INR 2-3 Heparin level goal 0.3-0.7 Monitor platelets by anticoagulation protocol: Yes   Plan:  Continue heparin at 1800 units/hr Warfarin '10mg'$  PO x 1 tonight  Daily heparin level, CBC, and INR  Beth Marshall A. Levada Dy, PharmD, BCPS, FNKF Clinical Pharmacist [facility] Please utilize Amion for appropriate phone number to reach the unit pharmacist ([place])   06/22/2021 7:04 AM

## 2021-06-23 ENCOUNTER — Telehealth: Payer: Self-pay

## 2021-06-23 ENCOUNTER — Encounter (HOSPITAL_COMMUNITY): Payer: Self-pay | Admitting: Family Medicine

## 2021-06-23 DIAGNOSIS — S3011XA Contusion of abdominal wall, initial encounter: Secondary | ICD-10-CM

## 2021-06-23 DIAGNOSIS — D6851 Activated protein C resistance: Secondary | ICD-10-CM | POA: Diagnosis not present

## 2021-06-23 DIAGNOSIS — Z86718 Personal history of other venous thrombosis and embolism: Secondary | ICD-10-CM | POA: Diagnosis not present

## 2021-06-23 DIAGNOSIS — S301XXA Contusion of abdominal wall, initial encounter: Secondary | ICD-10-CM

## 2021-06-23 DIAGNOSIS — S8292XA Unspecified fracture of left lower leg, initial encounter for closed fracture: Secondary | ICD-10-CM | POA: Diagnosis not present

## 2021-06-23 LAB — TYPE AND SCREEN
ABO/RH(D): A POS
Antibody Screen: NEGATIVE
Unit division: 0

## 2021-06-23 LAB — CBC
HCT: 27 % — ABNORMAL LOW (ref 36.0–46.0)
Hemoglobin: 8.4 g/dL — ABNORMAL LOW (ref 12.0–15.0)
MCH: 29.6 pg (ref 26.0–34.0)
MCHC: 31.1 g/dL (ref 30.0–36.0)
MCV: 95.1 fL (ref 80.0–100.0)
Platelets: 201 10*3/uL (ref 150–400)
RBC: 2.84 MIL/uL — ABNORMAL LOW (ref 3.87–5.11)
RDW: 14.6 % (ref 11.5–15.5)
WBC: 8.7 10*3/uL (ref 4.0–10.5)
nRBC: 0 % (ref 0.0–0.2)

## 2021-06-23 LAB — BPAM RBC
Blood Product Expiration Date: 202306132359
ISSUE DATE / TIME: 202306051308
Unit Type and Rh: 6200

## 2021-06-23 LAB — HEMOGLOBIN AND HEMATOCRIT, BLOOD
HCT: 27 % — ABNORMAL LOW (ref 36.0–46.0)
HCT: 26.7 % — ABNORMAL LOW (ref 36.0–46.0)
Hemoglobin: 8.9 g/dL — ABNORMAL LOW (ref 12.0–15.0)
Hemoglobin: 8.6 g/dL — ABNORMAL LOW (ref 12.0–15.0)

## 2021-06-23 LAB — HEPARIN LEVEL (UNFRACTIONATED): Heparin Unfractionated: <0.1 IU/mL — ABNORMAL LOW (ref 0.30–0.70)

## 2021-06-23 LAB — PROTIME-INR
INR: 1.2 (ref 0.8–1.2)
Prothrombin Time: 15.3 seconds — ABNORMAL HIGH (ref 11.4–15.2)

## 2021-06-23 NOTE — Consult Note (Signed)
Beth Marshall 03-24-1965  [id number].    Requesting MD: Dr. Antonieta Pert Chief Complaint/Reason for Consult: rectus sheath hematoma  HPI:  This is a 56 yo morbidly obese (BMI 71), a fib on coumadin, Factor V Leiden's deficiency, DVT of LE, GERD, MDD, LE fractures with multiple recent falls who has been admitted secondary to these falls and subsequent left proximal tibial and fibula fx requiring IM nail along with R ankle fxs.  She denies hitting her abdomen at any point during these falls as far as she can remember.  She began having right-sided abdominal pain yesterday morning acutely at around 0830am.  She is currently on coumadin and heparin gtt secondary to her a fib, DVT, and factor V Leiden.  She underwent a CT scan that revealed a rectus sheath hematoma with no active extravasation.  Her hgb is relatively stable between 8 and 9 for the last 3-4 days.  We have been asked to see her for this CT finding.  She states she is having some RLQ abdominal pain but is only a 5.  ROS: ROSPlease see HPI  History reviewed. No pertinent family history.  Past Medical History:  Diagnosis Date   Abnormal coagulation profile 02/18/2015   Arthritis of right knee 12/20/2016   Back pain 02/18/2015   Blood clot in vein    Calculus of gallbladder with cholecystitis and obstruction 06/23/2014   Chronic deep vein thrombosis (DVT) of lower extremity ([place]) 10/27/2015   Dizziness 02/18/2015   Edema of extremities 02/18/2015   Elevated blood pressure reading 03/20/2015   Encounter for screening mammogram for breast cancer 10/27/2015   Factor V Leiden mutation ([place]) 10/27/2015   Gastroesophageal reflux disease 02/18/2015   Generalized anxiety disorder 02/18/2015   High risk medication use 07/25/2015   History of DVT (deep vein thrombosis) 03/15/2018   Hypokalemia 05/16/2015   Impaired fasting glucose 02/18/2015   Insomnia 02/18/2015   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   Lesion of ovary 02/18/2015   Lipoma 02/18/2015   Long  term current use of anticoagulant therapy 03/20/2015   Migraine without aura and without status migrainosus, not intractable 06/25/2015   Moderate episode of recurrent major depressive disorder ([place]) 08/02/2018   Obesity, morbid, BMI 50 or higher ([place]) 02/18/2015   OSA (obstructive sleep apnea) 02/18/2015   Osteoarthritis 02/18/2015   Primary osteoarthritis of both knees 08/30/2017   Primary osteoarthritis of left knee 04/22/2015   Protein S deficiency ([place]) 02/18/2015   Shortness of breath 02/18/2015   Tachycardia 07/01/2015   Tachycardia 07/01/2015    Past Surgical History:  Procedure Laterality Date   ABDOMINAL HYSTERECTOMY     TIBIA IM NAIL INSERTION Left 06/19/2021   Procedure: INTRAMEDULLARY (IM) NAIL TIBIAL;  Surgeon: Shona Needles, MD;  Location: [facility];  Service: Orthopedics;  Laterality: Left;    Social History:  reports that she has never smoked. She does not have any smokeless tobacco history on file. She reports that she does not drink alcohol and does not use drugs.  Allergies:  Allergies  Allergen Reactions   Cephalexin Other (See Comments)    unknown unknown    Codeine Nausea And Vomiting   Diphenhydramine Hcl Itching   Ranitidine Rash    Medications Prior to Admission  Medication Sig Dispense Refill   albuterol (PROVENTIL HFA;VENTOLIN HFA) 108 (90 BASE) MCG/ACT inhaler Inhale 1 puff into the lungs every 6 (six) hours as needed for wheezing or shortness of breath.     augmented betamethasone  dipropionate (DIPROLENE-AF) 0.05 % cream Apply 1 application. topically 2 (two) times daily.     BELSOMRA 10 MG TABS Take 1 tablet by mouth at bedtime as needed.     budesonide-formoterol (SYMBICORT) 160-4.5 MCG/ACT inhaler Inhale 2 puffs into the lungs daily.     calcipotriene (DOVONOX) 0.005 % cream Apply 1 application. topically 2 (two) times daily.     cetirizine (ZYRTEC) 10 MG tablet Take 1 tablet by mouth daily.     cyclobenzaprine (FLEXERIL) 5 MG tablet Take 5 mg by mouth 3  (three) times daily as needed for muscle spasms.     diclofenac Sodium (VOLTAREN) 1 % GEL Apply 2-3 g topically as needed for pain.     furosemide (LASIX) 40 MG tablet Take 40 mg by mouth daily.     montelukast (SINGULAIR) 10 MG tablet Take 1 tablet by mouth daily.     potassium chloride (K-DUR) 10 MEQ tablet Take 10 mEq by mouth 2 (two) times daily.     SUMAtriptan (IMITREX) 100 MG tablet Take 1 tablet by mouth as needed for migraine.     traMADol (ULTRAM) 50 MG tablet Take 1 tablet by mouth as needed for pain.     venlafaxine XR (EFFEXOR-XR) 75 MG 24 hr capsule Take 1 capsule by mouth daily.     warfarin (COUMADIN) 5 MG tablet Take 1 1/2 tablets daily or as directed by [facility]. PLEASE KEEP UPCOMING CARDIOLOGY APPT FOR FUTURE REFILLS. 50 tablet 0   [DISCONTINUED] oxyCODONE-acetaminophen (PERCOCET/ROXICET) 5-325 MG tablet Take by mouth.       Physical Exam: Blood pressure (!) 104/49, pulse 79, temperature 98.7 F (37.1 C), temperature source Oral, resp. rate 18, height '5\' 2"'$  (1.575 m), weight (!) 176.9 kg, SpO2 100 %. General: pleasant, morbidly obese white female who is laying in bed in NAD HEENT: head is normocephalic, atraumatic.  Sclera are noninjected.  PERRL.  Ears and nose without any masses or lesions.  Mouth is pink and moist Heart: regular, rate, and rhythm.  Normal s1,s2. No obvious murmurs, gallops, or rubs noted.  Palpable radial and pedal pulses bilaterally Lungs: CTAB, no wheezes, rhonchi, or rales noted.  Respiratory effort nonlabored Abd: soft, mildly tender in RLQ, no ecchymosis or abnormality palpable secondary to body habitus., ND, +BS, no masses, hernias. Psych: A&Ox3 with an appropriate affect.   Results for orders placed or performed during the hospital encounter of 06/18/21 (from the past 48 hour(s))  Protime-INR     Status: None   Collection Time: 06/22/21 12:55 AM  Result Value Ref Range   Prothrombin Time 14.3 11.4 - 15.2 seconds   INR 1.1 0.8 - 1.2     Comment: (NOTE) INR goal varies based on device and disease states. Performed at [facility], [place] [address] [place], [place] [address]   Heparin level (unfractionated)     Status: None   Collection Time: 06/22/21 12:55 AM  Result Value Ref Range   Heparin Unfractionated 0.41 0.30 - 0.70 IU/mL    Comment: (NOTE) The clinical reportable range upper limit is being lowered to >1.10 to align with the FDA approved guidance for the current laboratory assay.  If heparin results are below expected values, and patient dosage has  been confirmed, suggest follow up testing of antithrombin III levels. Performed at [facility], [place] [address] [place], [place] [address]   CBC     Status: Abnormal   Collection Time: 06/22/21 12:55 AM  Result Value Ref  Range   WBC 9.7 4.0 - 10.5 K/uL   RBC 2.62 (L) 3.87 - 5.11 MIL/uL   Hemoglobin 8.1 (L) 12.0 - 15.0 g/dL   HCT 25.1 (L) 36.0 - 46.0 %   MCV 95.8 80.0 - 100.0 fL   MCH 30.9 26.0 - 34.0 pg   MCHC 32.3 30.0 - 36.0 g/dL   RDW 13.4 11.5 - 15.5 %   Platelets 172 150 - 400 K/uL   nRBC 0.0 0.0 - 0.2 %    Comment: Performed at [facility], [place] [address] [place], [place] [address]  VITAMIN D 25 Hydroxy (Vit-D Deficiency, Fractures)     Status: Abnormal   Collection Time: 06/22/21 12:55 AM  Result Value Ref Range   Vit D, 25-Hydroxy 14.99 (L) 30 - 100 ng/mL    Comment: (NOTE) Vitamin D deficiency has been defined by the [place] practice guideline as a level of serum 25-OH  vitamin D less than 20 ng/mL (1,2). The Endocrine Society went on to  further define vitamin D insufficiency as a level between 21 and 29  ng/mL (2).  1. IOM (Institute of Medicine). 2010. Dietary reference intakes for  calcium and D. [place]: The [employer]. 2. Holick MF, Binkley [place], Bischoff-Ferrari HA, et al. Evaluation,  treatment, and prevention of vitamin D deficiency: an Endocrine  Society  clinical practice guideline, JCEM. 2011 Jul; 96(7): 1911-30.  Performed at [facility], [place] [address], [place], [place] [address]   ABO/Rh     Status: None   Collection Time: 06/22/21 12:55 AM  Result Value Ref Range   ABO/RH(D)      A POS Performed at [facility] [address] [place], [place] [address]   Hepatic function panel     Status: Abnormal   Collection Time: 06/22/21 12:55 AM  Result Value Ref Range   Total Protein 5.3 (L) 6.5 - 8.1 g/dL   Albumin 2.4 (L) 3.5 - 5.0 g/dL   AST 16 15 - 41 U/L   ALT 17 0 - 44 U/L   Alkaline Phosphatase 54 38 - 126 U/L   Total Bilirubin 0.5 0.3 - 1.2 mg/dL   Bilirubin, Direct 0.1 0.0 - 0.2 mg/dL   Indirect Bilirubin 0.4 0.3 - 0.9 mg/dL    Comment: Performed at [facility] [address] [place], [place] [address]  Lipase, blood     Status: None   Collection Time: 06/22/21 12:55 AM  Result Value Ref Range   Lipase 30 11 - 51 U/L    Comment: Performed at [facility] [address], [place], [place] [address]  Prepare RBC (crossmatch)     Status: None   Collection Time: 06/22/21 11:50 AM  Result Value Ref Range   Order Confirmation      ORDER PROCESSED BY BLOOD BANK Performed at [facility], [place] [address], [place],  [address]   Type and screen [place]     Status: None   Collection Time: 06/22/21 11:50 AM  Result Value Ref Range   ABO/RH(D) A POS    Antibody Screen NEG    Sample Expiration 06/25/2021,2359    Unit Number [id number]    Blood Component Type RED CELLS,LR    Unit division 00    Status of Unit ISSUED,FINAL    Transfusion Status OK TO TRANSFUSE    Crossmatch Result  Compatible Performed at [facility], [place] [address], [place], [place] [address]   Hemoglobin and hematocrit, blood     Status: Abnormal   Collection Time: 06/22/21 10:40 PM  Result Value Ref Range   Hemoglobin 9.1 (L) 12.0 - 15.0 g/dL   HCT 28.2 (L) 36.0 - 46.0 %     Comment: Performed at [facility], [place] [address], [place], [place] [address]  Protime-INR     Status: Abnormal   Collection Time: 06/23/21  3:02 AM  Result Value Ref Range   Prothrombin Time 15.3 (H) 11.4 - 15.2 seconds   INR 1.2 0.8 - 1.2    Comment: (NOTE) INR goal varies based on device and disease states. Performed at [facility], [place] [address], [place], [place] [address]   Heparin level (unfractionated)     Status: Abnormal   Collection Time: 06/23/21  3:02 AM  Result Value Ref Range   Heparin Unfractionated <0.10 (L) 0.30 - 0.70 IU/mL    Comment: (NOTE) The clinical reportable range upper limit is being lowered to >1.10 to align with the FDA approved guidance for the current laboratory assay.  If heparin results are below expected values, and patient dosage has  been confirmed, suggest follow up testing of antithrombin III levels. Performed at [facility], [place] [address], [place], [place] [address]   CBC     Status: Abnormal   Collection Time: 06/23/21  3:02 AM  Result Value Ref Range   WBC 8.7 4.0 - 10.5 K/uL   RBC 2.84 (L) 3.87 - 5.11 MIL/uL   Hemoglobin 8.4 (L) 12.0 - 15.0 g/dL   HCT 27.0 (L) 36.0 - 46.0 %   MCV 95.1 80.0 - 100.0 fL   MCH 29.6 26.0 - 34.0 pg   MCHC 31.1 30.0 - 36.0 g/dL   RDW 14.6 11.5 - 15.5 %   Platelets 201 150 - 400 K/uL   nRBC 0.0 0.0 - 0.2 %    Comment: Performed at [facility], [place] [address] [place], [place] [address]   CT ABDOMEN PELVIS W CONTRAST  Result Date: 06/22/2021 CLINICAL DATA:  Left lower quadrant pain radiating to the left leg. EXAM: CT ABDOMEN AND PELVIS WITH CONTRAST TECHNIQUE: Multidetector CT imaging of the abdomen and pelvis was performed using the standard protocol following bolus administration of intravenous contrast. RADIATION DOSE REDUCTION: This exam was performed according to the departmental dose-optimization program which includes automated exposure control, adjustment of the mA and/or  kV according to patient size and/or use of iterative reconstruction technique. CONTRAST:  150m OMNIPAQUE IOHEXOL 300 MG/ML  SOLN COMPARISON:  CT 06/23/2014 FINDINGS: Lower chest: No acute airspace disease or pleural effusion. Hepatobiliary: Cholecystectomy. There is mild intrahepatic biliary ductal dilatation. Common bile duct measures 9 mm, normal for post cholecystectomy status. There is no focal hepatic lesion. Pancreas: Mild fatty atrophy.  No ductal dilatation or inflammation. Spleen: Normal in size without focal abnormality. Adrenals/Urinary Tract: 2 cm right adrenal nodule is unchanged in size from 2016 exam, favoring benign adenoma. No left adrenal nodule. No hydronephrosis or perinephric edema. Homogeneous renal enhancement with symmetric excretion on delayed phase imaging. 11 mm hypodensity in the upper pole of the right kidney is unchanged from prior exam, consistent with cyst. This needs no further follow-up. No suspicious renal lesion. Punctate nonobstructing stone in the lower pole of the left kidney. Urinary bladder is physiologically distended without wall thickening. Stomach/Bowel: The stomach is distended with  ingested contrast. No gastric wall thickening. No small bowel obstruction, ileus, or inflammation. The appendix is normal. Moderate volume of stool throughout the colon with colonic tortuosity. No colonic inflammation. Vascular/Lymphatic: Normal caliber abdominal aorta. Patent portal vein. Retroaortic left renal vein. No abdominopelvic adenopathy. Reproductive: Hysterectomy.  No adnexal mass Other: There is a right rectus sheath hematoma involving the lower abdominal wall that measures 7.2 cm in transverse dimension. This spans approximately 9.9 cm cranial caudal. There is mild adjacent soft tissue stranding. Trace extension into the extraperitoneal pelvis on the right. There is no evidence 2 of active extravasation. No free air. Tiny fat containing umbilical hernia. Musculoskeletal:  Technically limited assessment of the lumbar spine or pelvis due to soft tissue attenuation from habitus. Allowing for this, no acute findings. IMPRESSION: 1. Moderate sized right rectus sheath hematoma. Trace extension into the extraperitoneal pelvis on the right. No evidence of active extravasation. 2. Punctate nonobstructing left renal stone. 3. Stable right adrenal nodule dating back to 2016 exam, favoring benign adenoma. Electronically Signed   By: Keith Rake M.D.   On: 06/22/2021 21:00   DG Abd Portable 1V  Result Date: 06/22/2021 CLINICAL DATA:  56 year old female with abdominal pain. EXAM: PORTABLE ABDOMEN - 1 VIEW COMPARISON:  None Available. FINDINGS: Nearly nondiagnostic radiograph secondary to body habitus and incomplete characterization of bowel gas. Suggestion of a few small loops of mild gaseous distension of small bowel in the central abdomen. The colon appears decompressed with normal stool burden. IMPRESSION: Nonspecific bowel gas pattern. Consider CT abdomen pelvis if there is clinical concern for bowel obstruction. Electronically Signed   By: Ruthann Cancer M.D.   On: 06/22/2021 13:05      Assessment/Plan Rectus Sheath Hematoma The patient has been seen and examined, chart, labs, vitals, I&Os reviewed.  She does have a rectus sheath hematoma but with no active extravasation.  Her hgb is relatively stable between 8 and 9 for the last 3-4 days.  We would recommend holding her anticoagulation at least for 48 hours to assure cessation in bleeding.  Ultimately, 2 weeks is the recommendation, but given her multiple comorbidities, risks vs benefit will have to be weighed by the primary service to determine when to restart her anticoagulation.  No surgical intervention is warranted for this problem.  These will generally tamponade on their own.  May transfuse as needed.  If she develops acute worsening with bleeding could consult IR for embolization.  Patient is currently stable and can be  monitored.   FEN - HH diet VTE - rec holding coumadin and heparin, see above ID - none  Multiple medical problems as listed above.  I reviewed Consultant ortho notes, hospitalist notes, last 24 h vitals and pain scores, last 48 h intake and output, last 24 h labs and trends, and last 24 h imaging results.  Henreitta Cea, [facility] Surgery 06/23/2021, 12:44 PM Please see Amion for pager number during day hours 7:00am-4:30pm or 7:00am -11:30am on weekends

## 2021-06-23 NOTE — Progress Notes (Signed)
PROGRESS NOTE Beth Marshall  EXN:[id number] DOB: 19-Oct-1965 DOA: 06/18/2021 PCP: Madison Hickman, FNP   Brief Narrative/Hospital Course: 56 y.o. 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 She lives alone and for the past week she has noticed increasing left foot pain.  Pain became so severe about 2 days PTA that she could barely walk.  She got up out of bed and fell.  She presented to [facility] but reportedly had no significant findings.  Then earlier on 06/18/21  she had difficulty getting up from the toilet and then fell back onto the commode when she tried to stand up.  She then called EMS and after they stood her up she fell again and heard her knee pop. At baseline she requires assistant with a walker and has chronic leg pain. In the ED, she was found to have fractures in her left lateral, medial malleolus, comminuted displaced fracture of the left tibial shaft.  She also had avulsion fracture of the right navicular. ED PA discussed with orthopedic and admitted.  Patient underwent IM nailing of left proximal tibia 6/2 after vitamin K for coagulopathy.  She was resumed back on heparin and Coumadin next day given high risk for VTE and A-fib history.  Patient was complaining of right lower quadrant and right hip pain CT abdomen showed moderate size right rectus sheath hematoma 6/5 and heparin drip held, surgery consulted    Subjective: Seen and examined this morning, Right lower quadrant abdominal discomfort better today Heparin has been held last night. No new complaint Assessment and Plan: Principal Problem:   Fracture of lower extremity Active Problems:   History of DVT (deep vein thrombosis)   Factor V Leiden mutation (HCC)   Obesity, morbid, BMI 50 or higher ([place])   Protein S deficiency ([place])    Atrial fibrillation (HCC)   Tibia/fibula fracture, left, closed, initial encounter   Right foot sprain, initial encounter   Acute postoperative anemia due to expected blood loss   Vitamin D deficiency   Rectus sheath hematoma, initial encounter   Fracture of lower extremity-left tibia: s/p IM nailing of left proximal tibia Dr Doreatha Martin 6/2.  Continue pain control, with scheduled tramadol every 6 hours, Dilaudid , cont PT OT dressing, DVT prophylaxis ( heparin-coumadin).  Appreciate orthopedics input  Rectus sheath hematoma, initial encounter, moderate: Acute postoperative anemia due to expected blood loss in the setting of fracture/coagulopathy due to Coumadin also due to rectus sheath hematoma: s/p 1 unit PRBC 6/5 improved from 8.1 to 9.1 g now downtrending again 8.4 g- see below. Surgery consulted advised to hold anticoagulation x2 weeks to allow tamponade effect.Check serial H&H and transfuse. Recent Labs  Lab 06/20/21 0306 06/21/21 0215 06/22/21 0055 06/22/21 2240 06/23/21 0302  HGB 9.6* 9.1* 8.1* 9.1* 8.4*  HCT 29.2* 28.3* 25.1* 28.2* 27.0*      Vitamin D deficiency start high-dose replacement.  Right foot fracture unsure if new or old, advised cam boot WBAT   History of DVT Factor V Leiden mutation Protein S deficiency Paroxysmal atrial fibrillation- in NSR now: Patient received vitamin for reversal preoperatively.INR  low-started heparin bridge/coumadin given multiple indication for anticoagulation but overnight drop in hemoglobin 6/5 and with moderate rectus sheath hematoma and for surgery tomorrow holding heparin/Coumadin, will request hematology consultation in this difficult setting.  Patient understands  and agrees with current plan of holding heparin.  Will await for further hematology inputs. Recent Labs  Lab 06/19/21 1828 06/20/21 0306 06/21/21 0215 06/22/21 0055 06/23/21 0302  INR 1.7* 1.4* 1.2 1.1 1.2    Obesity, morbid, BMI  71-currently trying to lose weight   OSA intolerant to CPAP. [place] bedtime.  DVT prophylaxis: SCDs Start: 06/19/21 1514 Code Status:   Code Status: Full Code Family Communication: plan of care discussed with patient at bedside. Patient status is: Inpatient because of ongoing management of post tibia fracture Level of care: Telemetry Medical   Dispo: The patient is from: HOME            Anticipated disposition: SNF .  Will be difficult placement  Mobility Assessment (last 72 hours)     Mobility Assessment     Row Name 06/22/21 1150 06/22/21 0800 06/20/21 2100 06/20/21 1541 06/20/21 1538   Does patient have an order for bedrest or is patient medically unstable -- No - Continue assessment No - Continue assessment -- --   What is the highest level of mobility based on the progressive mobility assessment? Level 2 ([place]) - Balance while sitting on edge of bed and cannot stand Level 2 (Chairfast) - Balance while sitting on edge of bed and cannot stand Level 2 (Chairfast) - Balance while sitting on edge of bed and cannot stand Level 2 (Chairfast) - Balance while sitting on edge of bed and cannot stand Level 2 (Chairfast) - Balance while sitting on edge of bed and cannot stand   Is the above level different from baseline mobility prior to current illness? -- Yes - Recommend PT order Yes - Recommend PT order -- --             Objective: Vitals last 24 hrs: Vitals:   06/22/21 1614 06/22/21 2126 06/23/21 0412 06/23/21 0904  BP: 116/63 130/63 (!) 114/52 (!) 104/49  Pulse: 77 77 65 79  Resp: '13 14 14 18  '$ Temp:  98.5 F (36.9 C) 98.6 F (37 C) 98.7 F (37.1 C)  TempSrc:  Oral Oral Oral  SpO2: 96% 100% 94% 100%  Weight:      Height:       Weight change:   Physical Examination: General exam: AA, morbidly obese, older than stated age, weak appearing. HEENT:Oral mucosa moist, Ear/Nose WNL grossly, dentition normal. Respiratory system: bilaterally diminished,no use of accessory muscle Cardiovascular system: S1 & S2 +, No  JVD,. Gastrointestinal system: Abdomen soft,NT,ND, BS+ Nervous System:Alert, awake, moving extremities and grossly nonfocal Extremities: incisional sites with dressing in place on left tibia  Skin: No rashes,no icterus. MSK: Normal muscle bulk,tone, power  Medications reviewed:  Scheduled Meds:  sodium chloride   Intravenous Once   docusate sodium  100 mg Oral BID   furosemide  40 mg Oral Daily   loratadine  10 mg Oral Daily   montelukast  10 mg Oral Daily   pantoprazole  40 mg Oral Daily   potassium chloride  10 mEq Oral BID   traMADol  50 mg Oral Q6H   triamcinolone cream   Topical BID   venlafaxine XR  75 mg Oral Daily   Vitamin D (Ergocalciferol)  50,000 Units Oral Q7 days   Continuous Infusions:    Diet Order             Diet Heart Room service appropriate? Yes; Fluid consistency: Thin  Diet effective now  Intake/Output Summary (Last 24 hours) at 06/23/2021 1114 Last data filed at 06/23/2021 0413 Gross per 24 hour  Intake 2808.48 ml  Output 1200 ml  Net 1608.48 ml    Net IO Since Admission: -193.7 mL [06/23/21 1114]  Wt Readings from Last 3 Encounters:  06/19/21 (!) 176.9 kg  05/07/14 (!) 181.4 kg     Unresulted Labs (From admission, onward)     Start     Ordered   06/22/21 0500  CBC  Daily,   R     Question:  Specimen collection method  Answer:  Lab=Lab collect   06/21/21 0851   06/19/21 0032  Protime-INR  Daily,   R      06/19/21 0033          Data Reviewed: I have personally reviewed following labs and imaging studies CBC: Recent Labs  Lab 06/18/21 2227 06/19/21 0327 06/20/21 0306 06/21/21 0215 06/22/21 0055 06/22/21 2240 06/23/21 0302  WBC 10.2 11.6* 9.8 13.2* 9.7  --  8.7  NEUTROABS 7.0  --   --   --   --   --   --   HGB 12.8 13.4 9.6* 9.1* 8.1* 9.1* 8.4*  HCT 41.1 43.0 29.2* 28.3* 25.1* 28.2* 27.0*  MCV 98.3 98.2 94.8 95.0 95.8  --  95.1  PLT 246 236 182 175 172  --  518   Basic Metabolic Panel: Recent Labs  Lab  06/18/21 2227 06/20/21 0306 06/21/21 0215  NA 136 137 135  K 4.5 3.7 3.8  CL 103 104 103  CO2 '23 26 27  '$ GLUCOSE 151* 193* 138*  BUN '12 10 14  '$ CREATININE 1.11* 0.62 0.68  CALCIUM 9.2 8.6* 8.6*   GFR: Estimated Creatinine Clearance: 129.4 mL/min (by C-G formula based on SCr of 0.68 mg/dL). Liver Function Tests: Recent Labs  Lab 06/18/21 2227 06/22/21 0055  AST 33 16  ALT 20 17  ALKPHOS 85 54  BILITOT 1.2 0.5  PROT 6.7 5.3*  ALBUMIN 3.7 2.4*   Recent Labs  Lab 06/22/21 0055  LIPASE 30   No results for input(s): AMMONIA in the last 168 hours. Coagulation Profile: Recent Labs  Lab 06/19/21 1828 06/20/21 0306 06/21/21 0215 06/22/21 0055 06/23/21 0302  INR 1.7* 1.4* 1.2 1.1 1.2   BNP (last 3 results) No results for input(s): PROBNP in the last 8760 hours. HbA1C: No results for input(s): HGBA1C in the last 72 hours. CBG: No results for input(s): GLUCAP in the last 168 hours. Lipid Profile: No results for input(s): CHOL, HDL, LDLCALC, TRIG, CHOLHDL, LDLDIRECT in the last 72 hours. Thyroid Function Tests: No results for input(s): TSH, T4TOTAL, FREET4, T3FREE, THYROIDAB in the last 72 hours. Sepsis Labs: No results for input(s): PROCALCITON, LATICACIDVEN in the last 168 hours.  Recent Results (from the past 240 hour(s))  Surgical pcr screen     Status: None   Collection Time: 06/19/21 11:21 AM   Specimen: Nasal Mucosa; Nasal Swab  Result Value Ref Range Status   MRSA, PCR NEGATIVE NEGATIVE Final   Staphylococcus aureus NEGATIVE NEGATIVE Final    Comment: (NOTE) The Xpert SA Assay (FDA approved for NASAL specimens in patients 18 years of age and older), is one component of a comprehensive surveillance program. It is not intended to diagnose infection nor to guide or monitor treatment. Performed at [facility], [place] [address] [place], [place] [address]     Antimicrobials: Anti-infectives (From admission, onward)    Start     Dose/Rate  Route  Frequency Ordered Stop   06/20/21 0000  vancomycin (VANCOCIN) IVPB 1000 mg/200 mL premix        1,000 mg 200 mL/hr over 60 Minutes Intravenous Every 12 hours 06/19/21 1513 06/20/21 0149   06/19/21 1336  vancomycin (VANCOCIN) powder  Status:  Discontinued          As needed 06/19/21 1337 06/19/21 1410   06/19/21 1130  vancomycin (VANCOREADY) IVPB 1500 mg/300 mL        1,500 mg 150 mL/hr over 120 Minutes Intravenous To ShortStay Surgical 06/19/21 1112 06/19/21 1239      Culture/Microbiology No results found for: SDES, SPECREQUEST, CULT, REPTSTATUS  Other culture-see note  Radiology Studies: CT ABDOMEN PELVIS W CONTRAST  Result Date: 06/22/2021 CLINICAL DATA:  Left lower quadrant pain radiating to the left leg. EXAM: CT ABDOMEN AND PELVIS WITH CONTRAST TECHNIQUE: Multidetector CT imaging of the abdomen and pelvis was performed using the standard protocol following bolus administration of intravenous contrast. RADIATION DOSE REDUCTION: This exam was performed according to the departmental dose-optimization program which includes automated exposure control, adjustment of the mA and/or kV according to patient size and/or use of iterative reconstruction technique. CONTRAST:  136m OMNIPAQUE IOHEXOL 300 MG/ML  SOLN COMPARISON:  CT 06/23/2014 FINDINGS: Lower chest: No acute airspace disease or pleural effusion. Hepatobiliary: Cholecystectomy. There is mild intrahepatic biliary ductal dilatation. Common bile duct measures 9 mm, normal for post cholecystectomy status. There is no focal hepatic lesion. Pancreas: Mild fatty atrophy.  No ductal dilatation or inflammation. Spleen: Normal in size without focal abnormality. Adrenals/Urinary Tract: 2 cm right adrenal nodule is unchanged in size from 2016 exam, favoring benign adenoma. No left adrenal nodule. No hydronephrosis or perinephric edema. Homogeneous renal enhancement with symmetric excretion on delayed phase imaging. 11 mm hypodensity in the upper pole of  the right kidney is unchanged from prior exam, consistent with cyst. This needs no further follow-up. No suspicious renal lesion. Punctate nonobstructing stone in the lower pole of the left kidney. Urinary bladder is physiologically distended without wall thickening. Stomach/Bowel: The stomach is distended with ingested contrast. No gastric wall thickening. No small bowel obstruction, ileus, or inflammation. The appendix is normal. Moderate volume of stool throughout the colon with colonic tortuosity. No colonic inflammation. Vascular/Lymphatic: Normal caliber abdominal aorta. Patent portal vein. Retroaortic left renal vein. No abdominopelvic adenopathy. Reproductive: Hysterectomy.  No adnexal mass Other: There is a right rectus sheath hematoma involving the lower abdominal wall that measures 7.2 cm in transverse dimension. This spans approximately 9.9 cm cranial caudal. There is mild adjacent soft tissue stranding. Trace extension into the extraperitoneal pelvis on the right. There is no evidence 2 of active extravasation. No free air. Tiny fat containing umbilical hernia. Musculoskeletal: Technically limited assessment of the lumbar spine or pelvis due to soft tissue attenuation from habitus. Allowing for this, no acute findings. IMPRESSION: 1. Moderate sized right rectus sheath hematoma. Trace extension into the extraperitoneal pelvis on the right. No evidence of active extravasation. 2. Punctate nonobstructing left renal stone. 3. Stable right adrenal nodule dating back to 2016 exam, favoring benign adenoma. Electronically Signed   By: MKeith RakeM.D.   On: 06/22/2021 21:00   DG Abd Portable 1V  Result Date: 06/22/2021 CLINICAL DATA:  56year old female with abdominal pain. EXAM: PORTABLE ABDOMEN - 1 VIEW COMPARISON:  None Available. FINDINGS: Nearly nondiagnostic radiograph secondary to body habitus and incomplete characterization of bowel gas. Suggestion of a few small loops of mild gaseous distension  of small bowel in the central abdomen. The colon appears decompressed with normal stool burden. IMPRESSION: Nonspecific bowel gas pattern. Consider CT abdomen pelvis if there is clinical concern for bowel obstruction. Electronically Signed   By: Ruthann Cancer M.D.   On: 06/22/2021 13:05     LOS: 4 days   Antonieta Pert, MD [facility]  06/23/2021, 11:14 AM

## 2021-06-23 NOTE — Consult Note (Addendum)
[place]  [phone] [phone]    [place]  Referring MD: Dr. Antonieta Pert   Reason for Referral: History of recurrent DVT, factor V Leiden heterozygous  HPI: Beth Marshall is a 56 year old female with a past medical history significant for morbid obesity, history of recurrent DVT, and a reported history of factor V Leiden and protein S deficiency, atrial fibrillation.  She presented to the emergency department with recurrent falls.  She had fallen out of bed.  X-ray of the tibia/fibula on the left showed an acute fracture of the proximal shafts of the left tibia and left fibula. X-ray of the left knee show a comminuted displaced proximal tibial shaft fracture without obvious intra-articular involvement, comminuted displaced proximal fibular fracture.  She was seen by orthopedics and was taken to the OR on 06/19/2021 for intramedullary nailing of the left proximal tibia.  Postoperatively, her hemoglobin was downtrending and she developed left lower quadrant pain radiating down to her left leg.  A CT of the abdomen/pelvis with contrast was obtained which showed a moderate-sized right rectus sheath hematoma with trace extension into the extraperitoneal pelvis on the right.  There is no evidence of active extravasation.  The patient has been maintained on Coumadin secondary to A-fib and history of factor V Leiden and reported protein S deficiency (no documentation of this).  Her INR was 2.8 on admission.  She received a dose of vitamin K 5 mg IV on 06/19/2021.  INR after vitamin K administration was down to 1.7 and then down to 1.4 on 06/20/2021.  On postop day 1, she was started on a heparin drip and her Coumadin was resumed.  INR this morning was 1.2.  Review of heparin levels show that she was either subtherapeutic or therapeutic.  The patient was seen on 1 occasion by Dr. Marcy Panning in [facility].  On that date, she underwent hypercoagulable  work-up.  I cannot see all of the results however, results through care everywhere indicate that she was factor V Leiden heterozygous and factor II negative (Factor V Leiden (F5 c.1601G>A): Heterozygous Factor II (F2) c.*97G>A: Negative).  I cannot see any records related to a protein S deficiency.  The patient is lying in her hospital bed.  He continues to report lower abdominal discomfort and leg pain.  She is not currently having any fevers, chills, headaches, chest pain, shortness of breath.  Denies nausea and vomiting.  She denies any bleeding such as epistaxis, hemoptysis, hematemesis, hematuria, melena, hematochezia.  I reviewed the patient's medical history with her and she does not recall ever seeing a hematologist.  She states that she has had recurrent blood clots in her left foot.  Denies prior history of pulmonary embolism.  She cannot tell me when her last clot was diagnosed but states that has been "many years."  She is unaware of the diagnosis of factor V Leiden.  She also states that she does not have atrial fibrillation.  The patient is single and lives by herself.  She has never been pregnant and has no children.  Has a sedentary lifestyle.  Uses a walker at baseline for ambulation.  Denies history of alcohol and tobacco use.  Family history significant for a mother who has had multiple blood clots in the past.  She states that her mother has an inherited tendency towards clotting but is not certain of the specific diagnosis. Hematology was asked see the patient make recommendations regarding anticoagulation.  Past  Medical History:  Diagnosis Date   Abnormal coagulation profile 02/18/2015   Arthritis of right knee 12/20/2016   Back pain 02/18/2015   Blood clot in vein    Calculus of gallbladder with cholecystitis and obstruction 06/23/2014   Chronic deep vein thrombosis (DVT) of lower extremity ([place]) 10/27/2015   Dizziness 02/18/2015   Edema of extremities 02/18/2015   Elevated blood  pressure reading 03/20/2015   Encounter for screening mammogram for breast cancer 10/27/2015   Factor V Leiden mutation ([place]) 10/27/2015   Gastroesophageal reflux disease 02/18/2015   Generalized anxiety disorder 02/18/2015   High risk medication use 07/25/2015   History of DVT (deep vein thrombosis) 03/15/2018   Hypokalemia 05/16/2015   Impaired fasting glucose 02/18/2015   Insomnia 02/18/2015   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   Lesion of ovary 02/18/2015   Lipoma 02/18/2015   Long term current use of anticoagulant therapy 03/20/2015   Migraine without aura and without status migrainosus, not intractable 06/25/2015   Moderate episode of recurrent major depressive disorder ([place]) 08/02/2018   Obesity, morbid, BMI 50 or higher ([place]) 02/18/2015   OSA (obstructive sleep apnea) 02/18/2015   Osteoarthritis 02/18/2015   Primary osteoarthritis of both knees 08/30/2017   Primary osteoarthritis of left knee 04/22/2015   Protein S deficiency ([place]) 02/18/2015   Shortness of breath 02/18/2015   Tachycardia 07/01/2015   Tachycardia 07/01/2015  :     Past Surgical History:  Procedure Laterality Date   ABDOMINAL HYSTERECTOMY     TIBIA IM NAIL INSERTION Left 06/19/2021   Procedure: INTRAMEDULLARY (IM) NAIL TIBIAL;  Surgeon: Shona Needles, MD;  Location: [place];  Service: Orthopedics;  Laterality: Left;  :   CURRENT MEDS: Current Facility-Administered Medications  Medication Dose Route Frequency Provider Last Rate Last Admin   0.9 %  sodium chloride infusion (Manually program via Guardrails IV Fluids)   Intravenous Once Antonieta Pert, MD       acetaminophen (TYLENOL) tablet 325-650 mg  325-650 mg Oral Q6H PRN Corinne Ports, PA-C   650 mg at 06/22/21 0943   albuterol (PROVENTIL) (2.5 MG/3ML) 0.083% nebulizer solution 2.5 mg  2.5 mg Inhalation Q6H PRN Corinne Ports, PA-C       cyclobenzaprine (FLEXERIL) tablet 5 mg  5 mg Oral TID PRN Corinne Ports, PA-C   5 mg at 06/23/21 1215   diclofenac Sodium (VOLTAREN) 1 % topical  gel 2 g  2 g Topical PRN Kc, Maren Beach, MD       docusate sodium (COLACE) capsule 100 mg  100 mg Oral BID Rushie Nyhan A, PA-C   100 mg at 06/23/21 0998   furosemide (LASIX) tablet 40 mg  40 mg Oral Daily Rushie Nyhan A, PA-C   40 mg at 06/23/21 3382   HYDROmorphone (DILAUDID) injection 0.5-1 mg  0.5-1 mg Intravenous Q3H PRN Corinne Ports, PA-C   1 mg at 06/22/21 2222   loperamide (IMODIUM) capsule 2 mg  2 mg Oral PRN Antonieta Pert, MD   2 mg at 06/20/21 1214   loratadine (CLARITIN) tablet 10 mg  10 mg Oral Daily Kc, Ramesh, MD   10 mg at 06/23/21 0953   metoCLOPramide (REGLAN) tablet 5-10 mg  5-10 mg Oral Q8H PRN Corinne Ports, PA-C       Or   metoCLOPramide (REGLAN) injection 5-10 mg  5-10 mg Intravenous Q8H PRN McClung, Sarah A, PA-C       montelukast (SINGULAIR) tablet 10 mg  10 mg Oral Daily McClung,  Sarah A, PA-C   10 mg at 06/23/21 0953   ondansetron (ZOFRAN) tablet 4 mg  4 mg Oral Q6H PRN Corinne Ports, PA-C       Or   ondansetron (ZOFRAN) injection 4 mg  4 mg Intravenous Q6H PRN Corinne Ports, PA-C   4 mg at 06/22/21 1624   oxyCODONE-acetaminophen (PERCOCET/ROXICET) 5-325 MG per tablet 1-2 tablet  1-2 tablet Oral Q4H PRN Corinne Ports, PA-C   2 tablet at 06/23/21 0953   pantoprazole (PROTONIX) EC tablet 40 mg  40 mg Oral Daily Kc, Ramesh, MD   40 mg at 06/23/21 0953   polyethylene glycol (MIRALAX / GLYCOLAX) packet 17 g  17 g Oral Daily PRN Rushie Nyhan A, PA-C       potassium chloride (KLOR-CON M) CR tablet 10 mEq  10 mEq Oral BID Rushie Nyhan A, PA-C   10 mEq at 06/23/21 0953   SUMAtriptan (IMITREX) tablet 100 mg  100 mg Oral PRN Antonieta Pert, MD   100 mg at 06/21/21 0056   traMADol (ULTRAM) tablet 50 mg  50 mg Oral Q6H McClung, Sarah A, PA-C   50 mg at 06/23/21 1215   triamcinolone cream (KENALOG) 0.5 %   Topical BID Suzzanne Cloud, [facility]   Given at 06/23/21 8786   venlafaxine XR (EFFEXOR-XR) 24 hr capsule 75 mg  75 mg Oral Daily Kc, Maren Beach, MD   75 mg at 06/22/21 2220    Vitamin D (Ergocalciferol) (DRISDOL) capsule 50,000 Units  50,000 Units Oral Q7 days Corinne Ports, PA-C   50,000 Units at 06/22/21 1149       Allergies  Allergen Reactions   Cephalexin Other (See Comments)    unknown unknown    Codeine Nausea And Vomiting   Diphenhydramine Hcl Itching   Ranitidine Rash  :  History reviewed. No pertinent family history.:   Social History   Socioeconomic History   Marital status: Single    Spouse name: Not on file   Number of children: Not on file   Years of education: Not on file   Highest education level: Not on file  Occupational History   Not on file  Tobacco Use   Smoking status: Never   Smokeless tobacco: Not on file  Substance and Sexual Activity   Alcohol use: No   Drug use: No    Frequency: 3.0 times per week   Sexual activity: Not on file  Other Topics Concern   Not on file  Social History Narrative   Not on file   Social Determinants of Health   Financial Resource Strain: Not on file  Food Insecurity: Not on file  Transportation Needs: Not on file  Physical Activity: Not on file  Stress: Not on file  Social Connections: Not on file  Intimate Partner Violence: Not on file  :  REVIEW OF SYSTEMS:  A comprehensive 14 point review of systems was negative except as noted in the HPI.    Exam: Patient Vitals for the past 24 hrs:  BP Temp Temp src Pulse Resp SpO2  06/23/21 1403 (!) 106/56 98.3 F (36.8 C) Oral 91 18 93 %  06/23/21 0904 (!) 104/49 98.7 F (37.1 C) Oral 79 18 100 %  06/23/21 0412 (!) 114/52 98.6 F (37 C) Oral 65 14 94 %  06/22/21 2126 130/63 98.5 F (36.9 C) Oral 77 14 100 %  06/22/21 1614 116/63 -- -- 77 13 96 %  06/22/21 1613 116/63 -- Oral 87  13 100 %    Physical Exam Vitals reviewed.  Constitutional:      General: She is not in acute distress. HENT:     Head: Normocephalic.     Mouth/Throat:     Pharynx: No oropharyngeal exudate or posterior oropharyngeal erythema.  Eyes:      General: No scleral icterus.    Conjunctiva/sclera: Conjunctivae normal.  Cardiovascular:     Rate and Rhythm: Normal rate.  Pulmonary:     Effort: Pulmonary effort is normal. No respiratory distress.  Abdominal:     Palpations: Abdomen is soft.     Comments: Tenderness over the right lower abdomen  Musculoskeletal:     Comments: Left lower extremity with edema and bruising  Skin:    General: Skin is warm and dry.  Neurological:     Mental Status: She is alert and oriented to person, place, and time.    LABS:  Lab Results  Component Value Date   WBC 8.7 06/23/2021   HGB 8.4 (L) 06/23/2021   HCT 27.0 (L) 06/23/2021   PLT 201 06/23/2021   GLUCOSE 138 (H) 06/21/2021   ALT 17 06/22/2021   AST 16 06/22/2021   NA 135 06/21/2021   K 3.8 06/21/2021   CL 103 06/21/2021   CREATININE 0.68 06/21/2021   BUN 14 06/21/2021   CO2 27 06/21/2021   INR 1.2 06/23/2021    DG Tibia/Fibula Left  Result Date: 06/19/2021 CLINICAL DATA:  Left tibial nail intraoperative fluoroscopy. EXAM: LEFT TIBIA AND FIBULA - 2 VIEW COMPARISON:  Left knee and left ankle radiographs 06/18/2021 FINDINGS: Images were performed intraoperatively without the presence of a radiologist. The patient is undergoing left tibial long stem intramedullary nail fixation spanning the comminuted and displaced proximal tibial metadiaphyseal fracture. There is improved anatomic alignment with persistent fracture line lucency. Redemonstration of comminuted and mildly displaced proximal fibular diaphyseal fracture. Redemonstration of chronic fracture deformity of the distal fibula. Total fluoroscopy images: 8 Total fluoroscopy time: 156 seconds Total dose: Radiation Exposure Index (as provided by the fluoroscopic device): 17.71 mGy air Kerma Please see intraoperative findings for further detail. IMPRESSION: Intraoperative fluoroscopy for left tibial ORIF. Electronically Signed   By: Yvonne Kendall M.D.   On: 06/19/2021 14:04   DG  Tibia/Fibula Left  Result Date: 06/18/2021 CLINICAL DATA:  Status post fall. EXAM: LEFT TIBIA AND FIBULA - 2 VIEW COMPARISON:  None Available. FINDINGS: Acute fracture deformities are seen involving the proximal shafts of the left tibia and left fibula. Medial angulation of the distal fracture sites is seen. There is no evidence of dislocation. Chronic fracture deformities are noted along the visualized portion of the left medial malleolus and left lateral malleolus. Soft tissue swelling is seen at the level of the previously noted acute fractures. IMPRESSION: Acute fracture of the proximal shafts of the left tibia and left fibula. Electronically Signed   By: Virgina Norfolk M.D.   On: 06/18/2021 23:31   DG Ankle Complete Left  Addendum Date: 06/18/2021   ADDENDUM REPORT: 06/18/2021 23:38 ADDENDUM: A superficial soft tissue defect is seen along the anterior aspect of the left ankle at the level of the distal left tibial shaft. Electronically Signed   By: Virgina Norfolk M.D.   On: 06/18/2021 23:38   Result Date: 06/18/2021 CLINICAL DATA:  Status post fall. EXAM: LEFT ANKLE COMPLETE - 3+ VIEW COMPARISON:  None Available. FINDINGS: There is no evidence of an acute fracture, dislocation, or joint effusion. Chronic fracture deformities are seen  involving the left lateral malleolus and left medial malleolus. Degenerative changes are seen along the dorsal aspect of the proximal and mid left foot. There is mild to moderate severity vascular calcification. Soft tissues are otherwise unremarkable. IMPRESSION: 1. No acute fracture or dislocation. 2. Chronic fracture deformities of the left lateral malleolus and left medial malleolus. Electronically Signed: By: Virgina Norfolk M.D. On: 06/18/2021 23:26   CT Knee Left Wo Contrast  Result Date: 06/19/2021 CLINICAL DATA:  Proximal tibial and fibular fractures on x-rays yesterday. CT requested for operative planning. EXAM: CT OF THE LEFT KNEE WITHOUT CONTRAST  TECHNIQUE: Multidetector CT imaging of the left knee was performed according to the standard protocol. Multiplanar CT image reconstructions were also generated. RADIATION DOSE REDUCTION: This exam was performed according to the departmental dose-optimization program which includes automated exposure control, adjustment of the mA and/or kV according to patient size and/or use of iterative reconstruction technique. COMPARISON:  Left knee plain films yesterday. FINDINGS: Bones/Joint/Cartilage Beam hardening artifact due to body habitus limits fine detail in the images. The bone mineralization is osteopenic. There are acute fractures of the proximal tibia and fibula. There is a transverse oblique proximal tibial shaft fracture beginning 6 cm below the tibial joint line with multiple comminution fragments along the anteromedial fracture margins and additional posterior comminution fragments. The comminution fragments demonstrate spreading around the fracture site, and the main distal tibial fragment is displaced laterally by up to 1/2 of a shaft width and anteriorly by about 1/3 of a shaft width with mild angulation towards the medial aspect. Just above this level is a comminuted transverse oblique fracture of the neck and proximal shaft of the fibula, with mild spreading of the comminution fragments, the distal fragment displaced laterally by an entire bone width and anteriorly by 1/2 of a shaft width, and the main distal fragment also mildly medially angulated. There is no tibial plateau fracture and no fracture line is seen extending into the proximal tibiofibular joint. There is advanced tricompartmental degenerative arthrosis of the knee with bulky reactive osteophytes and joint space loss greatest in the medial femorotibial joint where it is bone-on-bone. There is a small to moderate suprapatellar bursal effusion but it is low in density and not suspicious for hemarthrosis. There is a 1.5 cm osteochondral loose  body in the anterior central femorotibial joint space and a posterolateral fabella. Ligaments Suboptimally assessed by CT. Muscles and Tendons The tendons not well seen due to beam hardening but grossly intact as far as visualized. The imaged portion of the lower extremity demonstrates normal muscle bulk. There is no appreciable intramuscular hematoma in the proximal foreleg. Soft tissues There is moderate nonlocalizing patchy subcutaneous hemorrhage in the anterior proximal foreleg at the level of the fractures. There are calcifications in the popliteal artery. There are subcutaneous calcifications in the anterior proximal foreleg which are probably phleboliths. IMPRESSION: 1. Proximal tibial and fibular fractures as described above with comminution, and with medial angulation of the distal fragments. 2. Advanced tricompartmental degenerative arthrosis of the knee, with a 1.5 cm loose body in the anterior central joint space and a small to moderate low-density suprapatellar bursal effusion. 3. Moderate nonlocalizing subcutaneous soft tissue hemorrhage in the anterior foreleg at the level of the fractures. No appreciable intramuscular hematoma. 4. Calcifications in the popliteal artery. Greater than usually expected at this age. Electronically Signed   By: Telford Nab M.D.   On: 06/19/2021 01:11   CT ABDOMEN PELVIS W CONTRAST  Result Date: 06/22/2021  CLINICAL DATA:  Left lower quadrant pain radiating to the left leg. EXAM: CT ABDOMEN AND PELVIS WITH CONTRAST TECHNIQUE: Multidetector CT imaging of the abdomen and pelvis was performed using the standard protocol following bolus administration of intravenous contrast. RADIATION DOSE REDUCTION: This exam was performed according to the departmental dose-optimization program which includes automated exposure control, adjustment of the mA and/or kV according to patient size and/or use of iterative reconstruction technique. CONTRAST:  116m OMNIPAQUE IOHEXOL 300 MG/ML   SOLN COMPARISON:  CT 06/23/2014 FINDINGS: Lower chest: No acute airspace disease or pleural effusion. Hepatobiliary: Cholecystectomy. There is mild intrahepatic biliary ductal dilatation. Common bile duct measures 9 mm, normal for post cholecystectomy status. There is no focal hepatic lesion. Pancreas: Mild fatty atrophy.  No ductal dilatation or inflammation. Spleen: Normal in size without focal abnormality. Adrenals/Urinary Tract: 2 cm right adrenal nodule is unchanged in size from 2016 exam, favoring benign adenoma. No left adrenal nodule. No hydronephrosis or perinephric edema. Homogeneous renal enhancement with symmetric excretion on delayed phase imaging. 11 mm hypodensity in the upper pole of the right kidney is unchanged from prior exam, consistent with cyst. This needs no further follow-up. No suspicious renal lesion. Punctate nonobstructing stone in the lower pole of the left kidney. Urinary bladder is physiologically distended without wall thickening. Stomach/Bowel: The stomach is distended with ingested contrast. No gastric wall thickening. No small bowel obstruction, ileus, or inflammation. The appendix is normal. Moderate volume of stool throughout the colon with colonic tortuosity. No colonic inflammation. Vascular/Lymphatic: Normal caliber abdominal aorta. Patent portal vein. Retroaortic left renal vein. No abdominopelvic adenopathy. Reproductive: Hysterectomy.  No adnexal mass Other: There is a right rectus sheath hematoma involving the lower abdominal wall that measures 7.2 cm in transverse dimension. This spans approximately 9.9 cm cranial caudal. There is mild adjacent soft tissue stranding. Trace extension into the extraperitoneal pelvis on the right. There is no evidence 2 of active extravasation. No free air. Tiny fat containing umbilical hernia. Musculoskeletal: Technically limited assessment of the lumbar spine or pelvis due to soft tissue attenuation from habitus. Allowing for this, no  acute findings. IMPRESSION: 1. Moderate sized right rectus sheath hematoma. Trace extension into the extraperitoneal pelvis on the right. No evidence of active extravasation. 2. Punctate nonobstructing left renal stone. 3. Stable right adrenal nodule dating back to 2016 exam, favoring benign adenoma. Electronically Signed   By: MKeith RakeM.D.   On: 06/22/2021 21:00   DG Knee Complete 4 Views Left  Result Date: 06/18/2021 CLINICAL DATA:  Post fall with deformity. EXAM: LEFT KNEE - COMPLETE 4+ VIEW COMPARISON:  None Available. FINDINGS: Comminuted proximal tibial shaft fracture. Tibial shaft is displaced laterally with respect to the proximal tibia. No obvious intra-articular involvement. Comminuted displaced proximal fibular fracture just proximal to the tibial fracture site. Moderate underlying knee osteoarthritis. Cannot assess for knee joint effusion on provided views. IMPRESSION: 1. Comminuted displaced proximal tibial shaft fracture without obvious intra-articular involvement. 2. Comminuted displaced proximal fibular fracture. Electronically Signed   By: MKeith RakeM.D.   On: 06/18/2021 23:29   DG Tibia/Fibula Left Port  Result Date: 06/19/2021 CLINICAL DATA:  Status post ORIF left proximal tibia fracture EXAM: PORTABLE LEFT TIBIA AND FIBULA - 2 VIEW COMPARISON:  06/18/2021 left tibia radiographs FINDINGS: Near-anatomic alignment of the comminuted proximal metadiaphysis left tibia fracture status post transfixation by intramedullary rod with multiple interlocking proximal and distal screws with no evidence of hardware fracture or loosening. Non articular proximal metaphysis left fibula  fracture with mild residual 4 mm lateral displacement of the distal fracture fragment. No dislocation at the left ankle or left knee on these views. No suspicious focal osseous lesions. At least moderate tricompartmental left knee osteoarthritis. Mild-to-moderate left ankle osteoarthritis. Vascular  calcifications throughout the soft tissues. Small plantar left calcaneal spur. IMPRESSION: 1. Near-anatomic alignment of the comminuted proximal left tibia fracture status post ORIF, with no evidence of hardware complication. 2. Mildly displaced non articular proximal left fibula fracture. Electronically Signed   By: Ilona Sorrel M.D.   On: 06/19/2021 16:05   DG Ankle Right Port  Result Date: 06/19/2021 CLINICAL DATA:  Leg injury, pain. EXAM: PORTABLE RIGHT ANKLE - 2 VIEW COMPARISON:  Foot radiograph yesterday. FINDINGS: Bones are subjectively under mineralized. Small osseous density again seen adjacent to the dorsal navicular, age indeterminate avulsion fracture. No other fracture of the ankle. There is a well corticated density distal to the medial malleolus that is chronic. No mortise widening. Mild tibial talar degenerative change. Moderate plantar calcaneal spur. Age advanced arterial vascular calcifications. Mild generalized soft tissue edema. IMPRESSION: 1. Small osseous density adjacent to the dorsal navicular, age indeterminate avulsion fracture. No other acute fracture of the ankle. 2. Age advanced arterial vascular calcifications. Electronically Signed   By: Keith Rake M.D.   On: 06/19/2021 01:11   DG Abd Portable 1V  Result Date: 06/22/2021 CLINICAL DATA:  56 year old female with abdominal pain. EXAM: PORTABLE ABDOMEN - 1 VIEW COMPARISON:  None Available. FINDINGS: Nearly nondiagnostic radiograph secondary to body habitus and incomplete characterization of bowel gas. Suggestion of a few small loops of mild gaseous distension of small bowel in the central abdomen. The colon appears decompressed with normal stool burden. IMPRESSION: Nonspecific bowel gas pattern. Consider CT abdomen pelvis if there is clinical concern for bowel obstruction. Electronically Signed   By: Ruthann Cancer M.D.   On: 06/22/2021 13:05   DG Foot Complete Left  Result Date: 06/18/2021 CLINICAL DATA:  Status post fall.  EXAM: LEFT FOOT - COMPLETE 3+ VIEW COMPARISON:  None Available. FINDINGS: There is no evidence of an acute fracture or dislocation. Mild to moderate severity degenerative changes are seen along the dorsal aspect of the proximal to mid left foot. A superficial soft tissue defect is seen along the anterior aspect of the distal left tibial shaft. IMPRESSION: 1. No acute fracture or dislocation. 2. Mild to moderate severity degenerative changes. 3. Superficial soft tissue defect along the anterior aspect of the distal left tibial shaft. Electronically Signed   By: Virgina Norfolk M.D.   On: 06/18/2021 23:29   DG Foot Complete Right  Result Date: 06/18/2021 CLINICAL DATA:  Status post fall. EXAM: RIGHT FOOT COMPLETE - 3+ VIEW COMPARISON:  None Available. FINDINGS: Small fracture deformities of indeterminate age are seen along the dorsal aspects of the right talus and right navicular bone. There is no evidence of dislocation. A small to moderate sized plantar calcaneal spur is seen. Mild dorsal soft tissue swelling is noted. IMPRESSION: 1. Small fracture deformities of indeterminate age along the dorsal aspects of the right talus and right navicular bone. Correlation with physical examination is recommended to determine the presence of point tenderness. 2. Small to moderate-sized plantar calcaneal spur. 3. Mild dorsal soft tissue swelling. Electronically Signed   By: Virgina Norfolk M.D.   On: 06/18/2021 23:35   DG C-Arm 1-60 Min-No Report  Result Date: 06/19/2021 Fluoroscopy was utilized by the requesting physician.  No radiographic interpretation.   DG C-Arm  1-60 Min-No Report  Result Date: 06/19/2021 Fluoroscopy was utilized by the requesting physician.  No radiographic interpretation.     ASSESSMENT AND PLAN:  1.  History of recurrent DVT 2.  Factor V Leiden heterozygous,?  Protein S deficiency -Labs performed 10/27/2015-factor V Leiden heterozygous, factor II negative 3.  Rectus sheath hematoma 4.   Acute blood loss anemia 5.  Fracture of the left tibia, intramedullary nailing 06/19/2021 6.  Atrial fibrillation (?) 7.  Obstructive sleep apnea 8.  Morbid obesity  Beth Marshall was admitted to the hospital following a fall.  She has been maintained on Coumadin for history of factor V Leiden.  There is information in her chart indicating that she had a protein S deficiency but this is extremely rare and there is no lab data to support this. She reportedly has atrial fibrillation, but the patient denies this and I cannot find records related to this diagnosis.  She had a recent fall and fractured her left tibia.  Her Coumadin was reversed with vitamin K on admission she was taken to the OR.  Postoperatively, she was started on heparin and her Coumadin was restarted.  Heparin levels were subtherapeutic to normal and INR has not been therapeutic since receiving vitamin K.  She developed increased abdominal pain and was noted to have a rectus sheath hematoma.  Her heparin drip and Coumadin have now been stopped.   According to the patient, she has not had a DVT for a number of years.  Her recent surgery and immobility put her at increased risk for developing a recurrent DVT.  However, the risk of bleeding may outweigh the benefits of resuming anticoagulation.  I agree with general surgery's recommendation of holding anticoagulation for now. Recommendations: 1.  Hold anticoagulation, resume when felt to be safe by the general surgery and orthopedic services 2.  Okay to resume anticoagulation with Coumadin only, will likely take up to a week to achieve a therapeutic PT/INR since that she received vitamin K on 06/19/2021 3.  Agree with indefinite anticoagulation therapy, prefers to stay on Coumadin 4.  Monitor H&H and transfuse for hemoglobin less than 7. 5.  Outpatient follow-up with her primary provider and the Coumadin clinic  Thank you for this referral.  Beth Bussing, DNP, AGPCNP-BC, AOCNP   Ms.  Marshall was interviewed and examined.  I reviewed the abdomen CT images.  She was admitted with a left tibia fracture while on therapeutic Coumadin anticoagulation.  She has a history of recurrent venous thrombosis and she is a factor V Leiden heterozygote.  There is a report of protein S deficiency, but we cannot find documentation of this.  She was diagnosed with a rectus sheath hemorrhage following surgery.  Is not clear whethe hemorrhage was related to the fall at home, or heparin anticoagulation.  She remains at high risk for recurrent venous thrombosis given her history of DVT, obesity, sedentary lifestyle, surgery, and the fact 5 Leiden mutation.  I feel there is no indication for bridging anticoagulation in her case.  She could resume Coumadin within the next few days and it would likely take up to a week to achieve a therapeutic PT/INR.  She can be placed on bridging Lovenox anticoagulation if the PT/INR is not prolonging after 4-5 days of Coumadin.  She could be transitioned to a DOAC for long-term anticoagulation.  I discussed this with her.  She prefers to stay on Coumadin.  I was present for greater than 50% of today's visit.  I performed medical decision making.  Julieanne Manson, MD

## 2021-06-23 NOTE — Telephone Encounter (Signed)
Returned pt's call. Pt wanted to make me aware she was hospitalized d/t fall at home/fracture. Pt stated she will be discharged to rehab facility; however she is unsure what facility at this time. Warfarin/INR is being managed by hospital at this time. I let the pt know I appreciated her giving me an update and when she is discharged, she will need to make an appointment with Dr Drue Dun and the anticoagulation clinic. Pt verbalized understanding.

## 2021-06-23 NOTE — Progress Notes (Signed)
Occupational Therapy Treatment Patient Details Name: Beth Marshall MRN: [id number] DOB: [birth year] Today's Date: 06/23/2021   History of present illness pt is 56 y.o. 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 pt found with L proximal tibia fracture, now POD 1 IMN with TDWB to LLE. R foot fxs to be treated nonoperatively.   OT comments  Pt progressing towards acute OT goals. Max +2 physical assist to come to EOB position. Able to tolerate EOB about 5 minutes, fatigued quickly.  R lateral lean and LOBs with pt able to self correct. D/c recommendation remains appropriate.    Recommendations for follow up therapy are one component of a multi-disciplinary discharge planning process, led by the attending physician.  Recommendations may be updated based on patient status, additional functional criteria and insurance authorization.    Follow Up Recommendations  Skilled nursing-short term rehab (<3 hours/day)    Assistance Recommended at Discharge Frequent or constant Supervision/Assistance  Patient can return home with the following  A lot of help with bathing/dressing/bathroom   Equipment Recommendations  Other (comment) (defer to next venue)    Recommendations for Other Services      Precautions / Restrictions Precautions Precautions: Fall Precaution Comments: super morbid obesity Restrictions Weight Bearing Restrictions: Yes RLE Weight Bearing: Weight bearing as tolerated LLE Weight Bearing: Touchdown weight bearing       Mobility Bed Mobility Overal bed mobility: Needs Assistance Bed Mobility: Supine to Sit, Sit to Supine, Rolling Rolling: Max assist, +2 for physical assistance   Supine to sit: Max assist, +2 for physical assistance, HOB elevated Sit to supine: Max assist, +2 for physical  assistance   General bed mobility comments: unable to effectively roll onto side this session ( to her right). Attempts to use rails to assist but only rotates upper body    Transfers                   General transfer comment: unable due to [facility] restrictions/safety     Balance Overall balance assessment: Needs assistance Sitting-balance support: Feet supported, Bilateral upper extremity supported Sitting balance-[name]: Fair Sitting balance - Comments: able to sit with supervision. UE support and R foot touching floor ~ 5 min. Fatigued with this, occasionally resting on right arm Postural control: Right lateral lean                                 ADL either performed or assessed with clinical judgement   ADL Overall ADL's : Needs assistance/impaired Eating/Feeding: Set up;Minimal assistance;Sitting Eating/Feeding Details (indicate cue type and reason): unsupported sitting EOB. Intermittent and sudden LOB with pt able to self-correct. Grooming: Sitting;Moderate assistance;Minimal assistance Grooming Details (indicate cue type and reason): unsupported sitting, EOB                               General ADL Comments: +2 max to get from supine<>EOB. tolerated sitting EOB about 5 minutes. multiple intermittent LOB with pt able to self correct. heavy use of rails, pad    Extremity/Trunk Assessment Upper Extremity Assessment Upper Extremity Assessment: Generalized weakness   Lower Extremity Assessment Lower Extremity Assessment: Defer to PT evaluation        Vision  Perception     Praxis      Cognition Arousal/Alertness: Awake/alert Behavior During Therapy: WFL for tasks assessed/performed Overall Cognitive Status: Within Functional Limits for tasks assessed                                 General Comments: appears to be at baseline Ox4, overall good safety. needing frequent redirection to task.        Exercises       Shoulder Instructions       General Comments      Pertinent Vitals/ Pain       Pain Assessment Pain Assessment: Faces Faces Pain Scale: Hurts little more Pain Location: L LE with mobility Pain Descriptors / Indicators: Discomfort, Grimacing, Guarding, Sore Pain Intervention(s): Monitored during session, Limited activity within patient's tolerance, Repositioned, Premedicated before session  Home Living                                          Prior Functioning/Environment              Frequency  Min 2X/week        Progress Toward Goals  OT Goals(current goals can now be found in the care plan section)  Progress towards OT goals: Progressing toward goals  Acute Rehab OT Goals Patient Stated Goal: to be able to get out of bed OT Goal Formulation: With patient Time For Goal Achievement: 07/04/21 Potential to Achieve Goals: Good ADL Goals Pt Will Perform Grooming: with set-up;sitting Pt/caregiver will Perform Home Exercise Program: Both right and left upper extremity;Increased strength;With written HEP provided;With theraband Additional ADL Goal #1: pt will complete bed mobility with Sup in prep for participation with completion of functional transfers and ADLs  Plan Discharge plan remains appropriate    Co-evaluation    PT/OT/SLP Co-Evaluation/Treatment: Yes Reason for Co-Treatment: For patient/therapist safety;To address functional/ADL transfers PT goals addressed during session: Mobility/safety with mobility OT goals addressed during session: ADL's and self-care      AM-PAC OT "6 Clicks" Daily Activity     Outcome Measure   Help from another person eating meals?: None Help from another person taking care of personal grooming?: A Little Help from another person toileting, which includes using toliet, bedpan, or urinal?: Total Help from another person bathing (including washing, rinsing, drying)?: A Lot Help from another person to  put on and taking off regular upper body clothing?: A Little Help from another person to put on and taking off regular lower body clothing?: Total 6 Click Score: 14    End of Session    OT Visit Diagnosis: Other abnormalities of gait and mobility (R26.89);History of falling (Z91.81);Repeated falls (R29.6);Muscle weakness (generalized) (M62.81)   Activity Tolerance Patient tolerated treatment well;Patient limited by fatigue;Patient limited by pain   Patient Left in bed;with call bell/phone within reach   Nurse Communication          Time: 2330-0762 OT Time Calculation (min): 24 min  Charges: OT General Charges $OT Visit: 1 Visit OT Treatments $Self Care/Home Management : 8-22 mins  Tyrone Schimke, OT Acute Rehabilitation Services Office: [phone]   Hortencia Pilar 06/23/2021, 2:18 PM

## 2021-06-23 NOTE — Progress Notes (Signed)
Physical Therapy Treatment Patient Details Name: Beth Marshall MRN: [id number] DOB: 1965-08-01 Today's Date: 06/23/2021   History of Present Illness pt is 56 y.o. 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 pt found with L proximal tibia fracture, now POD 1 IMN with TDWB to LLE. R foot fxs to be treated nonoperatively.    PT Comments    Patient received in bed, she reports pain in L LE and weakness overall. She is agreeable to PT session. Patient requires cues for mobility and heavy use of bed rails to assist with mobility. Although she puts forth good effort she ultimately needs Max +2 for all bed mobility due to size, pain in L LE and weakness. She will continue to benefit from skilled PT while here to improve mobility and strength.     Recommendations for follow up therapy are one component of a multi-disciplinary discharge planning process, led by the attending physician.  Recommendations may be updated based on patient status, additional functional criteria and insurance authorization.  Follow Up Recommendations  Skilled nursing-short term rehab (<3 hours/day)     Assistance Recommended at Discharge Frequent or constant Supervision/Assistance  Patient can return home with the following Two people to help with walking and/or transfers;Two people to help with bathing/dressing/bathroom;Help with stairs or ramp for entrance;Assist for transportation;Assistance with cooking/housework;Direct supervision/assist for medications management   Equipment Recommendations  Other (comment) (TBD)    Recommendations for Other Services       Precautions / Restrictions Precautions Precautions: Fall Precaution Comments: super morbid obesity Restrictions Weight Bearing Restrictions: Yes RLE Weight Bearing: Weight  bearing as tolerated LLE Weight Bearing: Touchdown weight bearing     Mobility  Bed Mobility Overal bed mobility: Needs Assistance Bed Mobility: Supine to Sit, Sit to Supine, Rolling Rolling: Max assist, +2 for physical assistance   Supine to sit: Max assist, +2 for physical assistance, HOB elevated Sit to supine: Max assist, +2 for physical assistance   General bed mobility comments: unable to effectively roll onto side this session ( to her right). Attempts to use rails to assist but only rotates upper body    Transfers                   General transfer comment: unable due to [facility] restrictions/safety    Ambulation/Gait               General Gait Details: unable   Stairs             Wheelchair Mobility    Modified Rankin (Stroke Patients Only)       Balance Overall balance assessment: Needs assistance Sitting-balance support: Feet supported, Bilateral upper extremity supported Sitting balance-[name]: Fair Sitting balance - Comments: able to sit with supervision. UE support and R foot touching floor ~ 5 min. Fatigued with this, occasionally resting on right arm Postural control: Right lateral lean                                  Cognition Arousal/Alertness: Awake/alert Behavior During Therapy: WFL for tasks assessed/performed Overall Cognitive Status: Within Functional Limits for tasks assessed  General Comments: appears to be at baseline Ox4, overall good safety. needing frequent redirection to task.        Exercises Total Joint Exercises Ankle Circles/Pumps: AROM, Both, 15 reps    General Comments        Pertinent Vitals/Pain Pain Assessment Pain Assessment: Faces Faces Pain Scale: Hurts little more Pain Location: L LE with mobility Pain Descriptors / Indicators: Discomfort, Grimacing, Guarding, Sore Pain Intervention(s): Monitored during session, Repositioned     Home Living                          Prior Function            PT Goals (current goals can now be found in the care plan section) Acute Rehab PT Goals Patient Stated Goal: home PT Goal Formulation: With patient Time For Goal Achievement: 07/04/21 Potential to Achieve Goals: Fair Progress towards PT goals: Progressing toward goals    Frequency    Min 3X/week      PT Plan Current plan remains appropriate    Co-evaluation PT/OT/SLP Co-Evaluation/Treatment: Yes Reason for Co-Treatment: For patient/therapist safety;To address functional/ADL transfers PT goals addressed during session: Mobility/safety with mobility        AM-PAC PT "6 Clicks" Mobility   Outcome Measure  Help needed turning from your back to your side while in a flat bed without using bedrails?: A Lot Help needed moving from lying on your back to sitting on the side of a flat bed without using bedrails?: A Lot Help needed moving to and from a bed to a chair (including a wheelchair)?: Total Help needed standing up from a chair using your arms (e.g., wheelchair or bedside chair)?: Total Help needed to walk in hospital room?: Total Help needed climbing 3-5 steps with a railing? : Total 6 Click Score: 8    End of Session   Activity Tolerance: Patient tolerated treatment well;Patient limited by fatigue Patient left: in bed;with call bell/phone within reach Nurse Communication: Mobility status PT Visit Diagnosis: Other abnormalities of gait and mobility (R26.89);Muscle weakness (generalized) (M62.81);Pain Pain - Right/Left: Left Pain - part of body: Leg     Time: 1305-1330 PT Time Calculation (min) (ACUTE ONLY): 25 min  Charges:  $Therapeutic Activity: 8-22 mins                     Rupinder Livingston, PT, GCS 06/23/21,1:39 PM

## 2021-06-23 NOTE — Telephone Encounter (Signed)
Pt called in and stated that they desperately needed to talk to abigail baynes rn they didn't state what the reason was but I will route to her.

## 2021-06-24 DIAGNOSIS — S8292XA Unspecified fracture of left lower leg, initial encounter for closed fracture: Secondary | ICD-10-CM | POA: Diagnosis not present

## 2021-06-24 DIAGNOSIS — D62 Acute posthemorrhagic anemia: Secondary | ICD-10-CM

## 2021-06-24 DIAGNOSIS — D6851 Activated protein C resistance: Secondary | ICD-10-CM | POA: Diagnosis not present

## 2021-06-24 DIAGNOSIS — I4891 Unspecified atrial fibrillation: Secondary | ICD-10-CM | POA: Diagnosis not present

## 2021-06-24 DIAGNOSIS — S93601A Unspecified sprain of right foot, initial encounter: Secondary | ICD-10-CM

## 2021-06-24 DIAGNOSIS — E559 Vitamin D deficiency, unspecified: Secondary | ICD-10-CM

## 2021-06-24 LAB — CBC
HCT: 27.2 % — ABNORMAL LOW (ref 36.0–46.0)
Hemoglobin: 8.9 g/dL — ABNORMAL LOW (ref 12.0–15.0)
MCH: 30.6 pg (ref 26.0–34.0)
MCHC: 32.7 g/dL (ref 30.0–36.0)
MCV: 93.5 fL (ref 80.0–100.0)
Platelets: 201 10*3/uL (ref 150–400)
RBC: 2.91 MIL/uL — ABNORMAL LOW (ref 3.87–5.11)
RDW: 14 % (ref 11.5–15.5)
WBC: 10.7 10*3/uL — ABNORMAL HIGH (ref 4.0–10.5)
nRBC: 0 % (ref 0.0–0.2)

## 2021-06-24 LAB — HEMOGLOBIN AND HEMATOCRIT, BLOOD
HCT: 26.7 % — ABNORMAL LOW (ref 36.0–46.0)
Hemoglobin: 8.8 g/dL — ABNORMAL LOW (ref 12.0–15.0)

## 2021-06-24 LAB — PROTIME-INR
INR: 1.4 — ABNORMAL HIGH (ref 0.8–1.2)
Prothrombin Time: 16.7 seconds — ABNORMAL HIGH (ref 11.4–15.2)

## 2021-06-24 MED ORDER — DICLOFENAC SODIUM 1 % EX GEL: 2.0000 g | CUTANEOUS | Status: DC | PRN

## 2021-06-24 NOTE — TOC Progression Note (Addendum)
Transition of Care [facility]) - Progression Note    Patient Details  Name: Beth Marshall MRN: [id number] Date of Birth: August 25, 1965  Transition of Care [facility]) CM/SW Contact  Joanne Chars, LCSW Phone Number: 06/24/2021, 10:03 AM  Clinical Narrative:   CSW spoke with pt and provided bed offers.  She asked CSW to speak with her mother Beth Marshall.  Spoke with mother Beth Marshall by phone, she does not want pt placed outside of the area, chose bed offer at [facility].  [facility] informed, awaiting confirmation.  PASSR number: [id number]  [id number]: [facility] confirmed, will order bariatric bed.   Expected Discharge Plan: [place] Barriers to Discharge: Continued Medical Work up, SNF Pending bed offer  Expected Discharge Plan and Services Expected Discharge Plan: [place] In-house Referral: Clinical Social Work   Post Acute Care Choice: [place] Living arrangements for the past 2 months: Single Family Home                                       Social Determinants of Health (SDOH) Interventions    Readmission Risk Interventions     View : No data to display.

## 2021-06-24 NOTE — Progress Notes (Signed)
5 Days Post-Op  Subjective: CC: Stable abdominal pain  Objective: Vital signs in last 24 hours: Temp:  [98.3 F (36.8 C)-98.4 F (36.9 C)] 98.4 F (36.9 C) (06/07 0836) Pulse Rate:  [78-91] 86 (06/07 0836) Resp:  [14-18] 17 (06/07 0836) BP: (106-119)/(47-66) 109/47 (06/07 0836) SpO2:  [93 %-98 %] 98 % (06/07 0836) Last BM Date : 06/23/21  Intake/Output from previous day: 06/06 0701 - 06/07 0700 In: 240 [P.O.:240] Out: 5000 [Urine:5000] Intake/Output this shift: No intake/output data recorded.  PE: Abd: soft, mildly tender in RLQ, no large hematoma or abnormality palpable secondary to body habitus., ND  Lab Results:  Recent Labs    06/23/21 0302 06/23/21 1446 06/23/21 2219 06/24/21 0552  WBC 8.7  --   --  10.7*  HGB 8.4*   < > 8.6* 8.9*  HCT 27.0*   < > 26.7* 27.2*  PLT 201  --   --  201   < > = values in this interval not displayed.   BMET No results for input(s): NA, K, CL, CO2, GLUCOSE, BUN, CREATININE, CALCIUM in the last 72 hours. PT/INR Recent Labs    06/23/21 0302 06/24/21 0552  LABPROT 15.3* 16.7*  INR 1.2 1.4*   CMP     Component Value Date/Time   NA 135 06/21/2021 0215   K 3.8 06/21/2021 0215   CL 103 06/21/2021 0215   CO2 27 06/21/2021 0215   GLUCOSE 138 (H) 06/21/2021 0215   BUN 14 06/21/2021 0215   CREATININE 0.68 06/21/2021 0215   CALCIUM 8.6 (L) 06/21/2021 0215   PROT 5.3 (L) 06/22/2021 0055   ALBUMIN 2.4 (L) 06/22/2021 0055   AST 16 06/22/2021 0055   ALT 17 06/22/2021 0055   ALKPHOS 54 06/22/2021 0055   BILITOT 0.5 06/22/2021 0055   GFRNONAA >60 06/21/2021 0215   GFRAA >90 05/07/2014 2017   Lipase     Component Value Date/Time   LIPASE 30 06/22/2021 0055    Studies/Results: CT ABDOMEN PELVIS W CONTRAST  Result Date: 06/22/2021 CLINICAL DATA:  Left lower quadrant pain radiating to the left leg. EXAM: CT ABDOMEN AND PELVIS WITH CONTRAST TECHNIQUE: Multidetector CT imaging of the abdomen and pelvis was performed using the  standard protocol following bolus administration of intravenous contrast. RADIATION DOSE REDUCTION: This exam was performed according to the departmental dose-optimization program which includes automated exposure control, adjustment of the mA and/or kV according to patient size and/or use of iterative reconstruction technique. CONTRAST:  165m OMNIPAQUE IOHEXOL 300 MG/ML  SOLN COMPARISON:  CT 06/23/2014 FINDINGS: Lower chest: No acute airspace disease or pleural effusion. Hepatobiliary: Cholecystectomy. There is mild intrahepatic biliary ductal dilatation. Common bile duct measures 9 mm, normal for post cholecystectomy status. There is no focal hepatic lesion. Pancreas: Mild fatty atrophy.  No ductal dilatation or inflammation. Spleen: Normal in size without focal abnormality. Adrenals/Urinary Tract: 2 cm right adrenal nodule is unchanged in size from 2016 exam, favoring benign adenoma. No left adrenal nodule. No hydronephrosis or perinephric edema. Homogeneous renal enhancement with symmetric excretion on delayed phase imaging. 11 mm hypodensity in the upper pole of the right kidney is unchanged from prior exam, consistent with cyst. This needs no further follow-up. No suspicious renal lesion. Punctate nonobstructing stone in the lower pole of the left kidney. Urinary bladder is physiologically distended without wall thickening. Stomach/Bowel: The stomach is distended with ingested contrast. No gastric wall thickening. No small bowel obstruction, ileus, or inflammation. The appendix is normal. Moderate volume  of stool throughout the colon with colonic tortuosity. No colonic inflammation. Vascular/Lymphatic: Normal caliber abdominal aorta. Patent portal vein. Retroaortic left renal vein. No abdominopelvic adenopathy. Reproductive: Hysterectomy.  No adnexal mass Other: There is a right rectus sheath hematoma involving the lower abdominal wall that measures 7.2 cm in transverse dimension. This spans approximately 9.9  cm cranial caudal. There is mild adjacent soft tissue stranding. Trace extension into the extraperitoneal pelvis on the right. There is no evidence 2 of active extravasation. No free air. Tiny fat containing umbilical hernia. Musculoskeletal: Technically limited assessment of the lumbar spine or pelvis due to soft tissue attenuation from habitus. Allowing for this, no acute findings. IMPRESSION: 1. Moderate sized right rectus sheath hematoma. Trace extension into the extraperitoneal pelvis on the right. No evidence of active extravasation. 2. Punctate nonobstructing left renal stone. 3. Stable right adrenal nodule dating back to 2016 exam, favoring benign adenoma. Electronically Signed   By: Keith Rake M.D.   On: 06/22/2021 21:00   DG Abd Portable 1V  Result Date: 06/22/2021 CLINICAL DATA:  56 year old female with abdominal pain. EXAM: PORTABLE ABDOMEN - 1 VIEW COMPARISON:  None Available. FINDINGS: Nearly nondiagnostic radiograph secondary to body habitus and incomplete characterization of bowel gas. Suggestion of a few small loops of mild gaseous distension of small bowel in the central abdomen. The colon appears decompressed with normal stool burden. IMPRESSION: Nonspecific bowel gas pattern. Consider CT abdomen pelvis if there is clinical concern for bowel obstruction. Electronically Signed   By: Ruthann Cancer M.D.   On: 06/22/2021 13:05    Anti-infectives: Anti-infectives (From admission, onward)    Start     Dose/Rate Route Frequency Ordered Stop   06/20/21 0000  vancomycin (VANCOCIN) IVPB 1000 mg/200 mL premix        1,000 mg 200 mL/hr over 60 Minutes Intravenous Every 12 hours 06/19/21 1513 06/20/21 0149   06/19/21 1336  vancomycin (VANCOCIN) powder  Status:  Discontinued          As needed 06/19/21 1337 06/19/21 1410   06/19/21 1130  vancomycin (VANCOREADY) IVPB 1500 mg/300 mL        1,500 mg 150 mL/hr over 120 Minutes Intravenous To ShortStay Surgical 06/19/21 1112 06/19/21 1239         Assessment/Plan Rectus Sheath Hematoma - Suspect 2/2 anticoagulation/heparin bridge + coumadin. These are now held and Hgb is stable. Would hold anticoagulation for ~48-72hrs to allow this to stop oozing before considering restarting. No surgical intervention indicated  - Hematology has seen. Recommend indefinite anticoagulation in factor V Leiden hx. They discussed DOAC but patient would like to stay on Coumadin. Appears they are okay with restarting coumadin without bridge. Defer final anticoagulation recs to their team - We will sign off. Call back with any questions or concerns.    LOS: 5 days    Jillyn Ledger , [facility] Surgery 06/24/2021, 11:00 AM Please see Amion for pager number during day hours 7:00am-4:30pm

## 2021-06-24 NOTE — Plan of Care (Signed)
  Problem: Health Behavior/Discharge Planning: Goal: Ability to manage health-related needs will improve Outcome: Not Progressing   Problem: Clinical Measurements: Goal: Ability to maintain clinical measurements within normal limits will improve Outcome: Not Progressing Goal: Will remain free from infection Outcome: Not Progressing Goal: Diagnostic test results will improve Outcome: Not Progressing Goal: Respiratory complications will improve Outcome: Not Progressing Goal: Cardiovascular complication will be avoided Outcome: Not Progressing   Problem: Activity: Goal: Risk for activity intolerance will decrease Outcome: Not Progressing   Problem: Nutrition: Goal: Adequate nutrition will be maintained Outcome: Not Progressing   Problem: Pain Managment: Goal: General experience of comfort will improve Outcome: Not Progressing   Problem: Safety: Goal: Ability to remain free from injury will improve Outcome: Not Progressing

## 2021-06-24 NOTE — Plan of Care (Signed)

## 2021-06-24 NOTE — Progress Notes (Signed)
Every set PROGRESS NOTE    Beth Marshall  SWH:[id number] DOB: Feb 25, 1965 DOA: 06/18/2021 PCP: Madison Hickman, FNP    Brief Narrative:   Beth Marshall is a 56 y.o. female with past medical history significant for history of DVT, factor V Leiden and protein S deficiency, paroxysmal atrial fibrillation on Coumadin, chronic leg pain, morbid obesity who presented to [facility] ED on 6/1 via EMS following recurrent falls and complaining of left lower extremity pain.  Patient currently lives alone and for the past week she has noticed increasing left foot pain to the point where she could barely walk.  Patient reports difficulty getting up from the toilet and then fell back onto the commode.  Patient was seen at [facility] ED 1 day PTA but reportedly had no significant findings.  In the ED, temperature 98.2 F, HR 87, RR 18, BP 105/68, SPO2 98% on room air.  WBC 10.2, hemoglobin 12.8, platelets 246.  Sodium 136, potassium 4.5, chloride 103, CO2 23, glucose 151, BUN 12, creatinine 1.11.  AST 33, ALT 20, total bilirubin 1.2.  INR 2.8.  Right knee x-ray with comminuted displaced proximal tibial shaft fracture without obvious intra-articular involvement, comminuted displaced proximal fibular fracture.  Right foot with small fracture deformities of indeterminate age along the dorsal aspects of the right talus and right navicular bone.  Left foot with no acute fracture or dislocation.  Orthopedics was consulted.  Hospital service consulted for further evaluation management of tibial/fibular fracture.  Assessment & Plan:    Left proximal comminuted tibial/fibular fracture Patient presenting to ED following multiple falls at home with left lower extremity pain.  Imaging notable for comminuted displaced proximal tibial shaft fracture without obvious intra-articular involvement, comminuted displaced proximal fibular fracture.  Orthopedics was consulted and patient underwent intramedullary nailing of the left proximal  tibia on 06/18/2021 by Dr. Doreatha Martin. --Touchdown weightbearing left lower extremity --Tylenol as needed mild pain --Tramadol 50 mg PO q6h --Percocet 1-2 tablets every 4 hours paren moderate pain --Dilaudid 0.5-1 mg IV every 3 hours as needed severe pain --Flexeril 5 mg p.o. 3 times daily as needed muscle spasms --Continue PT/OT efforts while inpatient, pending SNF placement --Outpatient follow-up in 2 weeks with orthopedics  Right navicular avulsion fracture Evaluated by orthopedics, nonoperative. --RLE WBAT with hard soled shoe  Vitamin D deficiency Vitamin D 25-hydroxy low at 14.99. --Vitamin D 50,000 units p.o. every 7 days --We will need DEXA scan outpatient in 1-2 months  Postoperative blood loss anemia Rectus sheath hemorrhage Patient was diagnosed with rectus sheath hemorrhage following surgery, unclear whether this was related to the fall at home or heparin anticoagulation.  Patient on Coumadin outpatient.  S/p 1 unit PRBC on 06/22/2021.  Evaluated by hematology, recommending holding Coumadin for a few days without bridging and then can restart if hemoglobin remains stable. --Hgb 12.8>>8.1>>8.9>8.6>8.9>8.8 --Continue to hold Coumadin --CBC in a.m.  Hx DVT Factor V Leiden mutation Protein S deficiency Medical hematology/oncology following as above.  Currently holding Coumadin for rectus sheath hemorrhage.  Hemoglobin appears stable, if remains stable tomorrow we will likely restart Coumadin.  Paroxysmal atrial fibrillation Currently in normal sinus rhythm.  On Coumadin for anticoagulation; being held as above.  Asthma --Montelukast 10 mg p.o. daily  Morbid obesity Body mass index is 71.33 kg/m.  Discussed with patient needs for aggressive lifestyle changes/weight loss as this complicates all facets of care.  Outpatient follow-up with PCP.  May benefit from bariatric evaluation outpatient.  OSA Intolerant to CPAP.  Continue 2 L nasal cannula nightly.  DVT prophylaxis: SCDs  Start: 06/19/21 1514    Code Status: Full Code Family Communication: No family present at bedside this morning  Disposition Plan:  Level of care: Telemetry Medical Status is: Inpatient Remains inpatient appropriate because: Continue monitoring hemoglobin, pending SNF placement, anticipate discharge in 1-2 days    Consultants:  Orthopedics, Dr. Doreatha Martin Medical hematology/oncology, Dr. Benay Spice  Procedures:  Intramedullary nail left proximal tibia on 06/18/2021 by Dr. Doreatha Martin.  Antimicrobials:  Preoperative vancomycin   Subjective: Patient seen examined bedside, resting comfortably.  Lying in bed.  Complaining of pain to her left leg.  Discussed that we will continue to hold Coumadin today and ensure hemoglobin remained stable before restarting.  Seen by medical oncology yesterday, discussed possible change to DOAC; patient declined and wants to continue Coumadin.  Awaiting SNF placement.  No other complaints or concerns at this time.  Denies headache, no visual changes, no chest pain, no palpitations, no shortness of breath, no abdominal pain, no fever/chills/night sweats, no nausea/vomiting/diarrhea.  No acute events overnight per nursing staff.  Objective: Vitals:   06/23/21 2002 06/24/21 0429 06/24/21 0836 06/24/21 1300  BP: 119/66 (!) 106/53 (!) 109/47 (!) 112/50  Pulse: 90 78 86 85  Resp: '16 14 17   '$ Temp: 98.3 F (36.8 C) 98.4 F (36.9 C) 98.4 F (36.9 C) 98.2 F (36.8 C)  TempSrc: Oral Oral Oral Oral  SpO2: 95% 98% 98% 96%  Weight:      Height:        Intake/Output Summary (Last 24 hours) at 06/24/2021 1428 Last data filed at 06/24/2021 1610 Gross per 24 hour  Intake 240 ml  Output 4300 ml  Net -4060 ml   Filed Weights   06/19/21 1113  Weight: (!) 176.9 kg    Examination:  Physical Exam: GEN: NAD, alert and oriented x 3, morbidly obese, appears older than stated age HEENT: NCAT, PERRL, EOMI, sclera clear, MMM PULM: Breath sounds diminished bilateral bases, no  wheezing/crackles, normal Respaire effort without accessory muscle use, on room air at rest CV: RRR w/o M/G/R GI: abd soft, NTND, NABS, no R/G/M MSK: Left lower extremity with surgical dressings in place, clean/dry/intact, moves all extremities independently NEURO: CN II-XII intact, no focal deficits, sensation to light touch intact PSYCH: normal mood/affect Integumentary: dry/intact, no rashes or wounds    Data Reviewed: I have personally reviewed following labs and imaging studies  CBC: Recent Labs  Lab 06/18/21 2227 06/19/21 0327 06/20/21 0306 06/21/21 0215 06/22/21 0055 06/22/21 2240 06/23/21 0302 06/23/21 1446 06/23/21 2219 06/24/21 0552 06/24/21 1347  WBC 10.2   < > 9.8 13.2* 9.7  --  8.7  --   --  10.7*  --   NEUTROABS 7.0  --   --   --   --   --   --   --   --   --   --   HGB 12.8   < > 9.6* 9.1* 8.1*   < > 8.4* 8.9* 8.6* 8.9* 8.8*  HCT 41.1   < > 29.2* 28.3* 25.1*   < > 27.0* 27.0* 26.7* 27.2* 26.7*  MCV 98.3   < > 94.8 95.0 95.8  --  95.1  --   --  93.5  --   PLT 246   < > 182 175 172  --  201  --   --  201  --    < > = values in this interval not displayed.  Basic Metabolic Panel: Recent Labs  Lab 06/18/21 2227 06/20/21 0306 06/21/21 0215  NA 136 137 135  K 4.5 3.7 3.8  CL 103 104 103  CO2 '23 26 27  '$ GLUCOSE 151* 193* 138*  BUN '12 10 14  '$ CREATININE 1.11* 0.62 0.68  CALCIUM 9.2 8.6* 8.6*   GFR: Estimated Creatinine Clearance: 129.4 mL/min (by C-G formula based on SCr of 0.68 mg/dL). Liver Function Tests: Recent Labs  Lab 06/18/21 2227 06/22/21 0055  AST 33 16  ALT 20 17  ALKPHOS 85 54  BILITOT 1.2 0.5  PROT 6.7 5.3*  ALBUMIN 3.7 2.4*   Recent Labs  Lab 06/22/21 0055  LIPASE 30   No results for input(s): AMMONIA in the last 168 hours. Coagulation Profile: Recent Labs  Lab 06/20/21 0306 06/21/21 0215 06/22/21 0055 06/23/21 0302 06/24/21 0552  INR 1.4* 1.2 1.1 1.2 1.4*   Cardiac Enzymes: No results for input(s): CKTOTAL, CKMB,  CKMBINDEX, TROPONINI in the last 168 hours. BNP (last 3 results) No results for input(s): PROBNP in the last 8760 hours. HbA1C: No results for input(s): HGBA1C in the last 72 hours. CBG: No results for input(s): GLUCAP in the last 168 hours. Lipid Profile: No results for input(s): CHOL, HDL, LDLCALC, TRIG, CHOLHDL, LDLDIRECT in the last 72 hours. Thyroid Function Tests: No results for input(s): TSH, T4TOTAL, FREET4, T3FREE, THYROIDAB in the last 72 hours. Anemia Panel: No results for input(s): VITAMINB12, FOLATE, FERRITIN, TIBC, IRON, RETICCTPCT in the last 72 hours. Sepsis Labs: No results for input(s): PROCALCITON, LATICACIDVEN in the last 168 hours.  Recent Results (from the past 240 hour(s))  Surgical pcr screen     Status: None   Collection Time: 06/19/21 11:21 AM   Specimen: Nasal Mucosa; Nasal Swab  Result Value Ref Range Status   MRSA, PCR NEGATIVE NEGATIVE Final   Staphylococcus aureus NEGATIVE NEGATIVE Final    Comment: (NOTE) The Xpert SA Assay (FDA approved for NASAL specimens in patients 63 years of age and older), is one component of a comprehensive surveillance program. It is not intended to diagnose infection nor to guide or monitor treatment. Performed at [facility], [place] [address], [place], [place] [address]          Radiology Studies: CT ABDOMEN PELVIS W CONTRAST  Result Date: 06/22/2021 CLINICAL DATA:  Left lower quadrant pain radiating to the left leg. EXAM: CT ABDOMEN AND PELVIS WITH CONTRAST TECHNIQUE: Multidetector CT imaging of the abdomen and pelvis was performed using the standard protocol following bolus administration of intravenous contrast. RADIATION DOSE REDUCTION: This exam was performed according to the departmental dose-optimization program which includes automated exposure control, adjustment of the mA and/or kV according to patient size and/or use of iterative reconstruction technique. CONTRAST:  196m OMNIPAQUE IOHEXOL 300 MG/ML   SOLN COMPARISON:  CT 06/23/2014 FINDINGS: Lower chest: No acute airspace disease or pleural effusion. Hepatobiliary: Cholecystectomy. There is mild intrahepatic biliary ductal dilatation. Common bile duct measures 9 mm, normal for post cholecystectomy status. There is no focal hepatic lesion. Pancreas: Mild fatty atrophy.  No ductal dilatation or inflammation. Spleen: Normal in size without focal abnormality. Adrenals/Urinary Tract: 2 cm right adrenal nodule is unchanged in size from 2016 exam, favoring benign adenoma. No left adrenal nodule. No hydronephrosis or perinephric edema. Homogeneous renal enhancement with symmetric excretion on delayed phase imaging. 11 mm hypodensity in the upper pole of the right kidney is unchanged from prior exam, consistent with cyst. This needs no further follow-up. No suspicious  renal lesion. Punctate nonobstructing stone in the lower pole of the left kidney. Urinary bladder is physiologically distended without wall thickening. Stomach/Bowel: The stomach is distended with ingested contrast. No gastric wall thickening. No small bowel obstruction, ileus, or inflammation. The appendix is normal. Moderate volume of stool throughout the colon with colonic tortuosity. No colonic inflammation. Vascular/Lymphatic: Normal caliber abdominal aorta. Patent portal vein. Retroaortic left renal vein. No abdominopelvic adenopathy. Reproductive: Hysterectomy.  No adnexal mass Other: There is a right rectus sheath hematoma involving the lower abdominal wall that measures 7.2 cm in transverse dimension. This spans approximately 9.9 cm cranial caudal. There is mild adjacent soft tissue stranding. Trace extension into the extraperitoneal pelvis on the right. There is no evidence 2 of active extravasation. No free air. Tiny fat containing umbilical hernia. Musculoskeletal: Technically limited assessment of the lumbar spine or pelvis due to soft tissue attenuation from habitus. Allowing for this, no  acute findings. IMPRESSION: 1. Moderate sized right rectus sheath hematoma. Trace extension into the extraperitoneal pelvis on the right. No evidence of active extravasation. 2. Punctate nonobstructing left renal stone. 3. Stable right adrenal nodule dating back to 2016 exam, favoring benign adenoma. Electronically Signed   By: Keith Rake M.D.   On: 06/22/2021 21:00        Scheduled Meds:  sodium chloride   Intravenous Once   docusate sodium  100 mg Oral BID   furosemide  40 mg Oral Daily   loratadine  10 mg Oral Daily   montelukast  10 mg Oral Daily   pantoprazole  40 mg Oral Daily   potassium chloride  10 mEq Oral BID   traMADol  50 mg Oral Q6H   triamcinolone cream   Topical BID   venlafaxine XR  75 mg Oral Daily   Vitamin D (Ergocalciferol)  50,000 Units Oral Q7 days   Continuous Infusions:   LOS: 5 days    Time spent: 50 minutes spent on chart review, discussion with nursing staff, consultants, updating family and interview/physical exam; more than 50% of that time was spent in counseling and/or coordination of care.    Thaine Garriga J [place] DO [facility] Available via Epic secure chat 7am-7pm After these hours, please refer to coverage provider listed on [email or web address] 06/24/2021, 2:28 PM

## 2021-06-25 DIAGNOSIS — Z86718 Personal history of other venous thrombosis and embolism: Secondary | ICD-10-CM | POA: Diagnosis not present

## 2021-06-25 DIAGNOSIS — D62 Acute posthemorrhagic anemia: Secondary | ICD-10-CM | POA: Diagnosis not present

## 2021-06-25 DIAGNOSIS — S8292XA Unspecified fracture of left lower leg, initial encounter for closed fracture: Secondary | ICD-10-CM | POA: Diagnosis not present

## 2021-06-25 DIAGNOSIS — I4891 Unspecified atrial fibrillation: Secondary | ICD-10-CM | POA: Diagnosis not present

## 2021-06-25 DIAGNOSIS — D6851 Activated protein C resistance: Secondary | ICD-10-CM | POA: Diagnosis not present

## 2021-06-25 LAB — CBC
HCT: 27.3 % — ABNORMAL LOW (ref 36.0–46.0)
Hemoglobin: 8.8 g/dL — ABNORMAL LOW (ref 12.0–15.0)
MCH: 30.6 pg (ref 26.0–34.0)
MCHC: 32.2 g/dL (ref 30.0–36.0)
MCV: 94.8 fL (ref 80.0–100.0)
Platelets: 220 10*3/uL (ref 150–400)
RBC: 2.88 MIL/uL — ABNORMAL LOW (ref 3.87–5.11)
RDW: 13.8 % (ref 11.5–15.5)
WBC: 10.7 10*3/uL — ABNORMAL HIGH (ref 4.0–10.5)
nRBC: 0 % (ref 0.0–0.2)

## 2021-06-25 LAB — PROTIME-INR
INR: 1.3 — ABNORMAL HIGH (ref 0.8–1.2)
Prothrombin Time: 16.4 seconds — ABNORMAL HIGH (ref 11.4–15.2)

## 2021-06-25 MED ORDER — WARFARIN SODIUM 5 MG PO TABS
10.0000 mg | ORAL_TABLET | Freq: Once | ORAL | Status: AC
  Administered 2021-06-25: 10 mg via ORAL
  Filled 2021-06-25: qty 2

## 2021-06-25 MED ORDER — WARFARIN - PHARMACIST DOSING INPATIENT: Freq: Every day | Status: DC

## 2021-06-25 NOTE — Progress Notes (Signed)
[place] for Warfarin  Indication: history of DVT with factor V Leiden and protein S deficiency, atrial fibrillation  Allergies  Allergen Reactions   Cephalexin Other (See Comments)    unknown unknown    Codeine Nausea And Vomiting   Diphenhydramine Hcl Itching   Ranitidine Rash    Patient Measurements: Height: '5\' 2"'$  (157.5 cm) Weight: (!) 176.9 kg (390 lb) IBW/kg (Calculated) : 50.1 HEPARIN DW (KG): 105 kg   Vital Signs: Temp: 98 F (36.7 C) (06/08 0436) Temp Source: Oral (06/08 0436) BP: 111/60 (06/08 0436) Pulse Rate: 90 (06/08 0436)  Labs: Recent Labs    06/23/21 0302 06/23/21 1446 06/24/21 0552 06/24/21 1347 06/25/21 0306  HGB 8.4*   < > 8.9* 8.8* 8.8*  HCT 27.0*   < > 27.2* 26.7* 27.3*  PLT 201  --  201  --  220  LABPROT 15.3*  --  16.7*  --  16.4*  INR 1.2  --  1.4*  --  1.3*  HEPARINUNFRC <0.10*  --   --   --   --    < > = values in this interval not displayed.     Estimated Creatinine Clearance: 129.4 mL/min (by C-G formula based on SCr of 0.68 mg/dL).   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 ([place]) 10/27/2015   Dizziness 02/18/2015   Edema of extremities 02/18/2015   Elevated blood pressure reading 03/20/2015   Encounter for screening mammogram for breast cancer 10/27/2015   Factor V Leiden mutation ([place]) 10/27/2015   Gastroesophageal reflux disease 02/18/2015   Generalized anxiety disorder 02/18/2015   High risk medication use 07/25/2015   History of DVT (deep vein thrombosis) 03/15/2018   Hypokalemia 05/16/2015   Impaired fasting glucose 02/18/2015   Insomnia 02/18/2015   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   Lesion of ovary 02/18/2015   Lipoma 02/18/2015    Long term current use of anticoagulant therapy 03/20/2015   Migraine without aura and without status migrainosus, not intractable 06/25/2015   Moderate episode of recurrent major depressive disorder ([place]) 08/02/2018   Obesity, morbid, BMI 50 or higher ([place]) 02/18/2015   OSA (obstructive sleep apnea) 02/18/2015   Osteoarthritis 02/18/2015   Primary osteoarthritis of both knees 08/30/2017   Primary osteoarthritis of left knee 04/22/2015   Protein S deficiency ([place]) 02/18/2015   Shortness of breath 02/18/2015   Tachycardia 07/01/2015   Tachycardia 07/01/2015     Assessment: Pt on warfarin PTA for history of DVT with factor V Leiden and protein S deficiency, and atrial fibrillation. Admission INR 2.7. Reversed with vit K '5mg'$  IV 6/2 for OR for IM nailing. Patient developed  rectus sheath hematoma 6/5 post-op so heparin and warfarin held. Per Oncology, heparin bridge is not needed. Per teams, ok to restart warfarin without heparin on 6/8.    Home warfarin dose: 7.'5mg'$  daily     INR 1.3, last warfarin dose 6/5. H/H, plt stable. Eating 100% of meals.   Goal of Therapy:  INR 2-3  Monitor platelets by anticoagulation protocol: Yes   Plan:  Warfarin '10mg'$  PO x 1    Monitor daily INR, CBC Monitor for signs/symptoms of bleeding    Benetta Spar, PharmD, BCPS, BCCP Clinical  Pharmacist  Please check AMION for all [place] phone numbers After 10:00 PM, call [place] [phone]

## 2021-06-25 NOTE — Progress Notes (Signed)
Every set PROGRESS NOTE    Beth Marshall  RKY:[id number] DOB: [birth year] DOA: 06/18/2021 PCP: Madison Hickman, FNP    Brief Narrative:   Beth Marshall is a 56 y.o. female with past medical history significant for history of DVT, factor V Leiden and protein S deficiency, paroxysmal atrial fibrillation on Coumadin, chronic leg pain, morbid obesity who presented to [facility] ED on 6/1 via EMS following recurrent falls and complaining of left lower extremity pain.  Patient currently lives alone and for the past week she has noticed increasing left foot pain to the point where she could barely walk.  Patient reports difficulty getting up from the toilet and then fell back onto the commode.  Patient was seen at [facility] ED 1 day PTA but reportedly had no significant findings.  In the ED, temperature 98.2 F, HR 87, RR 18, BP 105/68, SPO2 98% on room air.  WBC 10.2, hemoglobin 12.8, platelets 246.  Sodium 136, potassium 4.5, chloride 103, CO2 23, glucose 151, BUN 12, creatinine 1.11.  AST 33, ALT 20, total bilirubin 1.2.  INR 2.8.  Right knee x-ray with comminuted displaced proximal tibial shaft fracture without obvious intra-articular involvement, comminuted displaced proximal fibular fracture.  Right foot with small fracture deformities of indeterminate age along the dorsal aspects of the right talus and right navicular bone.  Left foot with no acute fracture or dislocation.  Orthopedics was consulted.  Hospital service consulted for further evaluation management of tibial/fibular fracture.  Assessment & Plan:    Left proximal comminuted tibial/fibular fracture Patient presenting to ED following multiple falls at home with left lower extremity pain.  Imaging notable for comminuted displaced proximal tibial shaft fracture without obvious intra-articular involvement, comminuted displaced proximal fibular fracture.  Orthopedics was consulted and patient underwent intramedullary nailing of the left proximal  tibia on 06/18/2021 by Dr. Doreatha Martin. --Touchdown weightbearing left lower extremity --Tylenol as needed mild pain --Tramadol 50 mg PO q6h --Percocet 1-2 tablets every 4 hours paren moderate pain --Dilaudid 0.5-1 mg IV every 3 hours as needed severe pain --Flexeril 5 mg p.o. 3 times daily as needed muscle spasms --Continue PT/OT efforts while inpatient, pending SNF placement --Outpatient follow-up in 2 weeks with orthopedics  Right navicular avulsion fracture Evaluated by orthopedics, nonoperative. --RLE WBAT with hard soled shoe  Vitamin D deficiency Vitamin D 25-hydroxy low at 14.99. --Vitamin D 50,000 units p.o. every 7 days x 5 weeks --We will need DEXA scan outpatient in 1-2 months  Postoperative blood loss anemia Rectus sheath hemorrhage Patient was diagnosed with rectus sheath hemorrhage following surgery, unclear whether this was related to the fall at home or heparin anticoagulation.  Patient on Coumadin outpatient.  S/p 1 unit PRBC on 06/22/2021.  Evaluated by hematology, recommending holding Coumadin for a few days without bridging and then can restart if hemoglobin remains stable. --Hgb 12.8>>8.1>>8.9>8.6>8.9>8.8>8.8, stable --Restart Coumadin today, pharmacy consult for dosing/monitoring --CBC in a.m.  Hx DVT Factor V Leiden mutation Protein S deficiency Medical hematology/oncology following as above.  Patient on Coumadin outpatient but was held for rectus sheath hematoma.  Hemoglobin has been stable now restarting Coumadin today.  Paroxysmal atrial fibrillation Currently in normal sinus rhythm.  Restarting Coumadin.  Asthma --Montelukast 10 mg p.o. daily  Morbid obesity Body mass index is 71.33 kg/m.  Discussed with patient needs for aggressive lifestyle changes/weight loss as this complicates all facets of care.  Outpatient follow-up with PCP.  May benefit from bariatric evaluation outpatient.  OSA Intolerant to CPAP.  Continue 2 L nasal cannula nightly.  DVT  prophylaxis: SCDs Start: 06/19/21 1514 warfarin (COUMADIN) tablet 10 mg    Code Status: Full Code Family Communication: No family present at bedside this morning  Disposition Plan:  Level of care: Telemetry Medical Status is: Inpatient Remains inpatient appropriate because: Restarting Coumadin today, anticipate discharge to [facility] tomorrow for further rehabilitation    Consultants:  Orthopedics, Dr. Doreatha Martin Medical hematology/oncology, Dr. Benay Spice  Procedures:  Intramedullary nail left proximal tibia on 06/18/2021 by Dr. Doreatha Martin.  Antimicrobials:  Preoperative vancomycin   Subjective: Patient seen examined bedside, resting comfortably.  Lying in bed.  Complaining of pain to her left leg.  Discussed with patient that we will restart Coumadin today.  If hemoglobin remains stable tomorrow will discharge to SNF.  No other complaints or concerns at this time.  Denies headache, no visual changes, no chest pain, no palpitations, no shortness of breath, no abdominal pain, no fever/chills/night sweats, no nausea/vomiting/diarrhea.  No acute events overnight per nursing staff.  Objective: Vitals:   06/24/21 1300 06/24/21 2000 06/25/21 0436 06/25/21 1153  BP: (!) 112/50 (!) 104/55 111/60 (!) 115/58  Pulse: 85 85 90   Resp:  16 17   Temp: 98.2 F (36.8 C) 98.3 F (36.8 C) 98 F (36.7 C) 98.3 F (36.8 C)  TempSrc: Oral  Oral Oral  SpO2: 96% 100% 100%   Weight:      Height:        Intake/Output Summary (Last 24 hours) at 06/25/2021 1215 Last data filed at 06/25/2021 0900 Gross per 24 hour  Intake 480 ml  Output 3600 ml  Net -3120 ml   Filed Weights   06/19/21 1113  Weight: (!) 176.9 kg    Examination:  Physical Exam: GEN: NAD, alert and oriented x 3, morbidly obese, appears older than stated age HEENT: NCAT, PERRL, EOMI, sclera clear, MMM PULM: Breath sounds diminished bilateral bases, no wheezing/crackles, normal Respaire effort without accessory muscle use, on room  air at rest CV: RRR w/o M/G/R GI: abd soft, NTND, NABS, no R/G/M MSK: Left lower extremity with surgical incision sites noted; clean/dry/intact, moderate swelling throughout the left lower extremity as expected with ecchymosis and mild tenderness to palpation over the left knee, neurovascular intact  NEURO: CN II-XII intact, no focal deficits, sensation to light touch intact PSYCH: normal mood/affect Integumentary: dry/intact, no rashes or wounds    Data Reviewed: I have personally reviewed following labs and imaging studies  CBC: Recent Labs  Lab 06/18/21 2227 06/19/21 0327 06/21/21 0215 06/22/21 0055 06/22/21 2240 06/23/21 0302 06/23/21 1446 06/23/21 2219 06/24/21 0552 06/24/21 1347 06/25/21 0306  WBC 10.2   < > 13.2* 9.7  --  8.7  --   --  10.7*  --  10.7*  NEUTROABS 7.0  --   --   --   --   --   --   --   --   --   --   HGB 12.8   < > 9.1* 8.1*   < > 8.4* 8.9* 8.6* 8.9* 8.8* 8.8*  HCT 41.1   < > 28.3* 25.1*   < > 27.0* 27.0* 26.7* 27.2* 26.7* 27.3*  MCV 98.3   < > 95.0 95.8  --  95.1  --   --  93.5  --  94.8  PLT 246   < > 175 172  --  201  --   --  201  --  220   < > =  values in this interval not displayed.   Basic Metabolic Panel: Recent Labs  Lab 06/18/21 2227 06/20/21 0306 06/21/21 0215  NA 136 137 135  K 4.5 3.7 3.8  CL 103 104 103  CO2 '23 26 27  '$ GLUCOSE 151* 193* 138*  BUN '12 10 14  '$ CREATININE 1.11* 0.62 0.68  CALCIUM 9.2 8.6* 8.6*   GFR: Estimated Creatinine Clearance: 129.4 mL/min (by C-G formula based on SCr of 0.68 mg/dL). Liver Function Tests: Recent Labs  Lab 06/18/21 2227 06/22/21 0055  AST 33 16  ALT 20 17  ALKPHOS 85 54  BILITOT 1.2 0.5  PROT 6.7 5.3*  ALBUMIN 3.7 2.4*   Recent Labs  Lab 06/22/21 0055  LIPASE 30   No results for input(s): "AMMONIA" in the last 168 hours. Coagulation Profile: Recent Labs  Lab 06/21/21 0215 06/22/21 0055 06/23/21 0302 06/24/21 0552 06/25/21 0306  INR 1.2 1.1 1.2 1.4* 1.3*   Cardiac  Enzymes: No results for input(s): "CKTOTAL", "CKMB", "CKMBINDEX", "TROPONINI" in the last 168 hours. BNP (last 3 results) No results for input(s): "PROBNP" in the last 8760 hours. HbA1C: No results for input(s): "HGBA1C" in the last 72 hours. CBG: No results for input(s): "GLUCAP" in the last 168 hours. Lipid Profile: No results for input(s): "CHOL", "HDL", "LDLCALC", "TRIG", "CHOLHDL", "LDLDIRECT" in the last 72 hours. Thyroid Function Tests: No results for input(s): "TSH", "T4TOTAL", "FREET4", "T3FREE", "THYROIDAB" in the last 72 hours. Anemia Panel: No results for input(s): "VITAMINB12", "FOLATE", "FERRITIN", "TIBC", "IRON", "RETICCTPCT" in the last 72 hours. Sepsis Labs: No results for input(s): "PROCALCITON", "LATICACIDVEN" in the last 168 hours.  Recent Results (from the past 240 hour(s))  Surgical pcr screen     Status: None   Collection Time: 06/19/21 11:21 AM   Specimen: Nasal Mucosa; Nasal Swab  Result Value Ref Range Status   MRSA, PCR NEGATIVE NEGATIVE Final   Staphylococcus aureus NEGATIVE NEGATIVE Final    Comment: (NOTE) The Xpert SA Assay (FDA approved for NASAL specimens in patients 43 years of age and older), is one component of a comprehensive surveillance program. It is not intended to diagnose infection nor to guide or monitor treatment. Performed at [facility], [place] [address], [place], [place] [address]          Radiology Studies: No results found.      Scheduled Meds:  sodium chloride   Intravenous Once   docusate sodium  100 mg Oral BID   furosemide  40 mg Oral Daily   loratadine  10 mg Oral Daily   montelukast  10 mg Oral Daily   pantoprazole  40 mg Oral Daily   potassium chloride  10 mEq Oral BID   traMADol  50 mg Oral Q6H   triamcinolone cream   Topical BID   venlafaxine XR  75 mg Oral Daily   Vitamin D (Ergocalciferol)  50,000 Units Oral Q7 days   warfarin  10 mg Oral ONCE-1600   Warfarin - Pharmacist Dosing Inpatient    Does not apply q1600   Continuous Infusions:   LOS: 6 days    Time spent: 50 minutes spent on chart review, discussion with nursing staff, consultants, updating family and interview/physical exam; more than 50% of that time was spent in counseling and/or coordination of care.    Recie Cirrincione J [place] DO [facility] Available via Epic secure chat 7am-7pm After these hours, please refer to coverage provider listed on [email or web address] 06/25/2021, 12:15 PM

## 2021-06-25 NOTE — Plan of Care (Signed)
  Problem: Health Behavior/Discharge Planning: Goal: Ability to manage health-related needs will improve Outcome: Progressing   Problem: Clinical Measurements: Goal: Ability to maintain clinical measurements within normal limits will improve Outcome: Progressing Goal: Will remain free from infection Outcome: Progressing Goal: Diagnostic test results will improve Outcome: Progressing Goal: Respiratory complications will improve Outcome: Progressing Goal: Cardiovascular complication will be avoided Outcome: Progressing   Problem: Activity: Goal: Risk for activity intolerance will decrease Outcome: Progressing   Problem: Nutrition: Goal: Adequate nutrition will be maintained Outcome: Progressing   Problem: Coping: Goal: Level of anxiety will decrease Outcome: Progressing   Problem: Elimination: Goal: Will not experience complications related to bowel motility Outcome: Progressing Goal: Will not experience complications related to urinary retention Outcome: Progressing   Problem: Pain Managment: Goal: General experience of comfort will improve Outcome: Progressing   Problem: Safety: Goal: Ability to remain free from injury will improve Outcome: Progressing   Problem: Skin Integrity: Goal: Risk for impaired skin integrity will decrease Outcome: Progressing   Problem: Education: Goal: Knowledge of the prescribed therapeutic regimen will improve Outcome: Progressing   Problem: Activity: Goal: Ability to increase mobility will improve Outcome: Progressing   Problem: Physical Regulation: Goal: Postoperative complications will be avoided or minimized Outcome: Progressing   Problem: Pain Management: Goal: Pain level will decrease with appropriate interventions Outcome: Progressing   Problem: Skin Integrity: Goal: Will show signs of wound healing Outcome: Progressing

## 2021-06-25 NOTE — Progress Notes (Signed)
PT Cancellation Note  Patient Details Name: Beth Marshall MRN: [id number] DOB: 1965/07/31   Cancelled Treatment:    Reason Eval/Treat Not Completed: Patient declined, no reason specified;Other (comment), pt declining mobility, asking this PTA to come back after lunch. Will check back as schedule allows to continue with PT POC.    [name] Malisa Ruggiero 06/25/2021, 12:25 PM

## 2021-06-25 NOTE — Progress Notes (Addendum)
HEMATOLOGY-ONCOLOGY PROGRESS NOTE  ASSESSMENT AND PLAN: 1.  History of recurrent DVT 2.  Factor V Leiden heterozygous,?  Protein S deficiency -Labs performed 10/27/2015-factor V Leiden heterozygous, factor II negative 3.  Rectus sheath hematoma 06/22/2021 4.  Acute blood loss anemia 5.  Fracture of the left tibia, intramedullary nailing 06/19/2021 6.  Atrial fibrillation (?) 7.  Obstructive sleep apnea 8.  Morbid obesity  Beth Marshall appears stable.  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  There is report of protein S deficiency, we cannot find documentation to support this.  She was diagnosed with a rectus sheath hemorrhage following surgery and unclear if the hemorrhage is related to the fall at home or anticoagulation.  Okay from our standpoint to resume Coumadin and will likely take up to a week to achieve a therapeutic PT/INR.  She can be placed on bridging Lovenox if the PT/INR is not prolonged after 4 to 5 days of Coumadin.  Recommendations: 1.  Resume Coumadin 2.  Can start bridging Lovenox if PT/INR is not prolonged after 4 to 5 days of Coumadin 3.  Close outpatient monitoring of the PT/INR per the Coumadin clinic and her primary provider  Hematology will sign off at this time.  Please call for questions.  No outpatient follow-up will be scheduled.  Mikey Bussing  Ms. Bills was interviewed and examined.  She appears stable.  The hemoglobin is stable.  I agree with restarting Coumadin.  It will likely take several days to achieve a therapeutic PT/INR.  She will need close monitoring of her clinical status and the PT/INR while resuming Coumadin.  Please call hematology as needed.  SUBJECTIVE: The patient reports increased pain to the left side of her abdomen today but the right sided abdominal pain has improved.  No bleeding reported.  States that she will be going to SNF tomorrow but is hoping to be able to stay in the hospital until  Monday.   PHYSICAL EXAMINATION:  Vitals:   06/25/21 0436 06/25/21 1153  BP: 111/60 (!) 115/58  Pulse: 90   Resp: 17   Temp: 98 F (36.7 C) 98.3 F (36.8 C)  SpO2: 100%    Filed Weights   06/19/21 1113  Weight: (!) 176.9 kg    Intake/Output from previous day: 06/07 0701 - 06/08 0700 In: 960 [P.O.:960] Out: 4200 [Urine:4200]  Physical Exam Vitals reviewed.  Constitutional:      General: She is not in acute distress. HENT:     Head: Normocephalic.  Eyes:     General: No scleral icterus. Pulmonary:     Effort: Pulmonary effort is normal. No respiratory distress.  Abdominal:     Tenderness: There is abdominal tenderness.  Skin:    General: Skin is warm and dry.  Neurological:     Mental Status: She is alert and oriented to person, place, and time.     LABORATORY DATA:  I have reviewed the data as listed    Latest Ref Rng & Units 06/22/2021   12:55 AM 06/21/2021    2:15 AM 06/20/2021    3:06 AM  CMP  Glucose 70 - 99 mg/dL  138  193   BUN 6 - 20 mg/dL  14  10   Creatinine 0.44 - 1.00 mg/dL  0.68  0.62   Sodium 135 - 145 mmol/L  135  137   Potassium 3.5 - 5.1 mmol/L  3.8  3.7   Chloride 98 - 111 mmol/L  103  104   CO2 22 - 32 mmol/L  27  26   Calcium 8.9 - 10.3 mg/dL  8.6  8.6   Total Protein 6.5 - 8.1 g/dL 5.3     Total Bilirubin 0.3 - 1.2 mg/dL 0.5     Alkaline Phos 38 - 126 U/L 54     AST 15 - 41 U/L 16     ALT 0 - 44 U/L 17       Lab Results  Component Value Date   WBC 10.7 (H) 06/25/2021   HGB 8.8 (L) 06/25/2021   HCT 27.3 (L) 06/25/2021   MCV 94.8 06/25/2021   PLT 220 06/25/2021   NEUTROABS 7.0 06/18/2021    No results found for: "CEA1", "CEA", "KGU542", "CA125", "PSA1"  CT ABDOMEN PELVIS W CONTRAST  Result Date: 06/22/2021 CLINICAL DATA:  Left lower quadrant pain radiating to the left leg. EXAM: CT ABDOMEN AND PELVIS WITH CONTRAST TECHNIQUE: Multidetector CT imaging of the abdomen and pelvis was performed using the standard protocol following  bolus administration of intravenous contrast. RADIATION DOSE REDUCTION: This exam was performed according to the departmental dose-optimization program which includes automated exposure control, adjustment of the mA and/or kV according to patient size and/or use of iterative reconstruction technique. CONTRAST:  171m OMNIPAQUE IOHEXOL 300 MG/ML  SOLN COMPARISON:  CT 06/23/2014 FINDINGS: Lower chest: No acute airspace disease or pleural effusion. Hepatobiliary: Cholecystectomy. There is mild intrahepatic biliary ductal dilatation. Common bile duct measures 9 mm, normal for post cholecystectomy status. There is no focal hepatic lesion. Pancreas: Mild fatty atrophy.  No ductal dilatation or inflammation. Spleen: Normal in size without focal abnormality. Adrenals/Urinary Tract: 2 cm right adrenal nodule is unchanged in size from 2016 exam, favoring benign adenoma. No left adrenal nodule. No hydronephrosis or perinephric edema. Homogeneous renal enhancement with symmetric excretion on delayed phase imaging. 11 mm hypodensity in the upper pole of the right kidney is unchanged from prior exam, consistent with cyst. This needs no further follow-up. No suspicious renal lesion. Punctate nonobstructing stone in the lower pole of the left kidney. Urinary bladder is physiologically distended without wall thickening. Stomach/Bowel: The stomach is distended with ingested contrast. No gastric wall thickening. No small bowel obstruction, ileus, or inflammation. The appendix is normal. Moderate volume of stool throughout the colon with colonic tortuosity. No colonic inflammation. Vascular/Lymphatic: Normal caliber abdominal aorta. Patent portal vein. Retroaortic left renal vein. No abdominopelvic adenopathy. Reproductive: Hysterectomy.  No adnexal mass Other: There is a right rectus sheath hematoma involving the lower abdominal wall that measures 7.2 cm in transverse dimension. This spans approximately 9.9 cm cranial caudal. There is  mild adjacent soft tissue stranding. Trace extension into the extraperitoneal pelvis on the right. There is no evidence 2 of active extravasation. No free air. Tiny fat containing umbilical hernia. Musculoskeletal: Technically limited assessment of the lumbar spine or pelvis due to soft tissue attenuation from habitus. Allowing for this, no acute findings. IMPRESSION: 1. Moderate sized right rectus sheath hematoma. Trace extension into the extraperitoneal pelvis on the right. No evidence of active extravasation. 2. Punctate nonobstructing left renal stone. 3. Stable right adrenal nodule dating back to 2016 exam, favoring benign adenoma. Electronically Signed   By: MKeith RakeM.D.   On: 06/22/2021 21:00   DG Abd Portable 1V  Result Date: 06/22/2021 CLINICAL DATA:  56year old female with abdominal pain. EXAM: PORTABLE ABDOMEN - 1 VIEW COMPARISON:  None Available. FINDINGS: Nearly nondiagnostic radiograph secondary to body habitus and incomplete  characterization of bowel gas. Suggestion of a few small loops of mild gaseous distension of small bowel in the central abdomen. The colon appears decompressed with normal stool burden. IMPRESSION: Nonspecific bowel gas pattern. Consider CT abdomen pelvis if there is clinical concern for bowel obstruction. Electronically Signed   By: Ruthann Cancer M.D.   On: 06/22/2021 13:05   DG Tibia/Fibula Left Port  Result Date: 06/19/2021 CLINICAL DATA:  Status post ORIF left proximal tibia fracture EXAM: PORTABLE LEFT TIBIA AND FIBULA - 2 VIEW COMPARISON:  06/18/2021 left tibia radiographs FINDINGS: Near-anatomic alignment of the comminuted proximal metadiaphysis left tibia fracture status post transfixation by intramedullary rod with multiple interlocking proximal and distal screws with no evidence of hardware fracture or loosening. Non articular proximal metaphysis left fibula fracture with mild residual 4 mm lateral displacement of the distal fracture fragment. No  dislocation at the left ankle or left knee on these views. No suspicious focal osseous lesions. At least moderate tricompartmental left knee osteoarthritis. Mild-to-moderate left ankle osteoarthritis. Vascular calcifications throughout the soft tissues. Small plantar left calcaneal spur. IMPRESSION: 1. Near-anatomic alignment of the comminuted proximal left tibia fracture status post ORIF, with no evidence of hardware complication. 2. Mildly displaced non articular proximal left fibula fracture. Electronically Signed   By: Ilona Sorrel M.D.   On: 06/19/2021 16:05   DG Tibia/Fibula Left  Result Date: 06/19/2021 CLINICAL DATA:  Left tibial nail intraoperative fluoroscopy. EXAM: LEFT TIBIA AND FIBULA - 2 VIEW COMPARISON:  Left knee and left ankle radiographs 06/18/2021 FINDINGS: Images were performed intraoperatively without the presence of a radiologist. The patient is undergoing left tibial long stem intramedullary nail fixation spanning the comminuted and displaced proximal tibial metadiaphyseal fracture. There is improved anatomic alignment with persistent fracture line lucency. Redemonstration of comminuted and mildly displaced proximal fibular diaphyseal fracture. Redemonstration of chronic fracture deformity of the distal fibula. Total fluoroscopy images: 8 Total fluoroscopy time: 156 seconds Total dose: Radiation Exposure Index (as provided by the fluoroscopic device): 17.71 mGy air Kerma Please see intraoperative findings for further detail. IMPRESSION: Intraoperative fluoroscopy for left tibial ORIF. Electronically Signed   By: Yvonne Kendall M.D.   On: 06/19/2021 14:04   DG C-Arm 1-60 Min-No Report  Result Date: 06/19/2021 Fluoroscopy was utilized by the requesting physician.  No radiographic interpretation.   DG C-Arm 1-60 Min-No Report  Result Date: 06/19/2021 Fluoroscopy was utilized by the requesting physician.  No radiographic interpretation.   DG Ankle Right Port  Result Date:  06/19/2021 CLINICAL DATA:  Leg injury, pain. EXAM: PORTABLE RIGHT ANKLE - 2 VIEW COMPARISON:  Foot radiograph yesterday. FINDINGS: Bones are subjectively under mineralized. Small osseous density again seen adjacent to the dorsal navicular, age indeterminate avulsion fracture. No other fracture of the ankle. There is a well corticated density distal to the medial malleolus that is chronic. No mortise widening. Mild tibial talar degenerative change. Moderate plantar calcaneal spur. Age advanced arterial vascular calcifications. Mild generalized soft tissue edema. IMPRESSION: 1. Small osseous density adjacent to the dorsal navicular, age indeterminate avulsion fracture. No other acute fracture of the ankle. 2. Age advanced arterial vascular calcifications. Electronically Signed   By: Keith Rake M.D.   On: 06/19/2021 01:11   CT Knee Left Wo Contrast  Result Date: 06/19/2021 CLINICAL DATA:  Proximal tibial and fibular fractures on x-rays yesterday. CT requested for operative planning. EXAM: CT OF THE LEFT KNEE WITHOUT CONTRAST TECHNIQUE: Multidetector CT imaging of the left knee was performed according to the standard  protocol. Multiplanar CT image reconstructions were also generated. RADIATION DOSE REDUCTION: This exam was performed according to the departmental dose-optimization program which includes automated exposure control, adjustment of the mA and/or kV according to patient size and/or use of iterative reconstruction technique. COMPARISON:  Left knee plain films yesterday. FINDINGS: Bones/Joint/Cartilage Beam hardening artifact due to body habitus limits fine detail in the images. The bone mineralization is osteopenic. There are acute fractures of the proximal tibia and fibula. There is a transverse oblique proximal tibial shaft fracture beginning 6 cm below the tibial joint line with multiple comminution fragments along the anteromedial fracture margins and additional posterior comminution fragments. The  comminution fragments demonstrate spreading around the fracture site, and the main distal tibial fragment is displaced laterally by up to 1/2 of a shaft width and anteriorly by about 1/3 of a shaft width with mild angulation towards the medial aspect. Just above this level is a comminuted transverse oblique fracture of the neck and proximal shaft of the fibula, with mild spreading of the comminution fragments, the distal fragment displaced laterally by an entire bone width and anteriorly by 1/2 of a shaft width, and the main distal fragment also mildly medially angulated. There is no tibial plateau fracture and no fracture line is seen extending into the proximal tibiofibular joint. There is advanced tricompartmental degenerative arthrosis of the knee with bulky reactive osteophytes and joint space loss greatest in the medial femorotibial joint where it is bone-on-bone. There is a small to moderate suprapatellar bursal effusion but it is low in density and not suspicious for hemarthrosis. There is a 1.5 cm osteochondral loose body in the anterior central femorotibial joint space and a posterolateral fabella. Ligaments Suboptimally assessed by CT. Muscles and Tendons The tendons not well seen due to beam hardening but grossly intact as far as visualized. The imaged portion of the lower extremity demonstrates normal muscle bulk. There is no appreciable intramuscular hematoma in the proximal foreleg. Soft tissues There is moderate nonlocalizing patchy subcutaneous hemorrhage in the anterior proximal foreleg at the level of the fractures. There are calcifications in the popliteal artery. There are subcutaneous calcifications in the anterior proximal foreleg which are probably phleboliths. IMPRESSION: 1. Proximal tibial and fibular fractures as described above with comminution, and with medial angulation of the distal fragments. 2. Advanced tricompartmental degenerative arthrosis of the knee, with a 1.5 cm loose body in  the anterior central joint space and a small to moderate low-density suprapatellar bursal effusion. 3. Moderate nonlocalizing subcutaneous soft tissue hemorrhage in the anterior foreleg at the level of the fractures. No appreciable intramuscular hematoma. 4. Calcifications in the popliteal artery. Greater than usually expected at this age. Electronically Signed   By: Telford Nab M.D.   On: 06/19/2021 01:11   DG Ankle Complete Left  Addendum Date: 06/18/2021   ADDENDUM REPORT: 06/18/2021 23:38 ADDENDUM: A superficial soft tissue defect is seen along the anterior aspect of the left ankle at the level of the distal left tibial shaft. Electronically Signed   By: Virgina Norfolk M.D.   On: 06/18/2021 23:38   Result Date: 06/18/2021 CLINICAL DATA:  Status post fall. EXAM: LEFT ANKLE COMPLETE - 3+ VIEW COMPARISON:  None Available. FINDINGS: There is no evidence of an acute fracture, dislocation, or joint effusion. Chronic fracture deformities are seen involving the left lateral malleolus and left medial malleolus. Degenerative changes are seen along the dorsal aspect of the proximal and mid left foot. There is mild to moderate severity vascular calcification.  Soft tissues are otherwise unremarkable. IMPRESSION: 1. No acute fracture or dislocation. 2. Chronic fracture deformities of the left lateral malleolus and left medial malleolus. Electronically Signed: By: Virgina Norfolk M.D. On: 06/18/2021 23:26   DG Foot Complete Right  Result Date: 06/18/2021 CLINICAL DATA:  Status post fall. EXAM: RIGHT FOOT COMPLETE - 3+ VIEW COMPARISON:  None Available. FINDINGS: Small fracture deformities of indeterminate age are seen along the dorsal aspects of the right talus and right navicular bone. There is no evidence of dislocation. A small to moderate sized plantar calcaneal spur is seen. Mild dorsal soft tissue swelling is noted. IMPRESSION: 1. Small fracture deformities of indeterminate age along the dorsal aspects of  the right talus and right navicular bone. Correlation with physical examination is recommended to determine the presence of point tenderness. 2. Small to moderate-sized plantar calcaneal spur. 3. Mild dorsal soft tissue swelling. Electronically Signed   By: Virgina Norfolk M.D.   On: 06/18/2021 23:35   DG Tibia/Fibula Left  Result Date: 06/18/2021 CLINICAL DATA:  Status post fall. EXAM: LEFT TIBIA AND FIBULA - 2 VIEW COMPARISON:  None Available. FINDINGS: Acute fracture deformities are seen involving the proximal shafts of the left tibia and left fibula. Medial angulation of the distal fracture sites is seen. There is no evidence of dislocation. Chronic fracture deformities are noted along the visualized portion of the left medial malleolus and left lateral malleolus. Soft tissue swelling is seen at the level of the previously noted acute fractures. IMPRESSION: Acute fracture of the proximal shafts of the left tibia and left fibula. Electronically Signed   By: Virgina Norfolk M.D.   On: 06/18/2021 23:31   DG Knee Complete 4 Views Left  Result Date: 06/18/2021 CLINICAL DATA:  Post fall with deformity. EXAM: LEFT KNEE - COMPLETE 4+ VIEW COMPARISON:  None Available. FINDINGS: Comminuted proximal tibial shaft fracture. Tibial shaft is displaced laterally with respect to the proximal tibia. No obvious intra-articular involvement. Comminuted displaced proximal fibular fracture just proximal to the tibial fracture site. Moderate underlying knee osteoarthritis. Cannot assess for knee joint effusion on provided views. IMPRESSION: 1. Comminuted displaced proximal tibial shaft fracture without obvious intra-articular involvement. 2. Comminuted displaced proximal fibular fracture. Electronically Signed   By: Keith Rake M.D.   On: 06/18/2021 23:29   DG Foot Complete Left  Result Date: 06/18/2021 CLINICAL DATA:  Status post fall. EXAM: LEFT FOOT - COMPLETE 3+ VIEW COMPARISON:  None Available. FINDINGS: There is  no evidence of an acute fracture or dislocation. Mild to moderate severity degenerative changes are seen along the dorsal aspect of the proximal to mid left foot. A superficial soft tissue defect is seen along the anterior aspect of the distal left tibial shaft. IMPRESSION: 1. No acute fracture or dislocation. 2. Mild to moderate severity degenerative changes. 3. Superficial soft tissue defect along the anterior aspect of the distal left tibial shaft. Electronically Signed   By: Virgina Norfolk M.D.   On: 06/18/2021 23:29     Future Appointments  Date Time Provider [place]  07/02/2021 10:40 AM Park Liter, MD [facility] None      LOS: 6 days

## 2021-06-25 NOTE — Progress Notes (Signed)
Orthopaedic Trauma Progress Note  SUBJECTIVE: Doing ok this morning, currently eating her breakfast. Pain and swelling in left leg improving.  No chest pain. No SOB. No nausea/vomiting. No other complaints.    OBJECTIVE:  Vitals:   06/24/21 2000 06/25/21 0436  BP: (!) 104/55 111/60  Pulse: 85 90  Resp: 16 17  Temp: 98.3 F (36.8 C) 98 F (36.7 C)  SpO2: 100% 100%    General: Sitting up in bed eating breakfast, no acute distress Respiratory: No increased work of breathing.  LLE: Dressings removed, incisions are clean, dry, intact.  Moderate swelling throughout the lower leg as expected, this is improving .  Ecchymosis throughout tibia. Tenderness with palpation over the knee and throughout the lower leg as expected.  Tolerates very gentle ankle motion but notes some discomfort throughout the lower leg with this.  Extremity warm and well-perfused.  + DP pulse  RLE:  No significant swelling/ecchymosis to foot. Mildly tender to midfoot but nothing significant. Nontender through ankle and knee. Motor and sensory functions intact. Foiot warm and well perfused. + DP pulse     IMAGING: Stable post op imaging.   LABS:  Results for orders placed or performed during the hospital encounter of 06/18/21 (from the past 24 hour(s))  Hemoglobin and hematocrit, blood     Status: Abnormal   Collection Time: 06/24/21  1:47 PM  Result Value Ref Range   Hemoglobin 8.8 (L) 12.0 - 15.0 g/dL   HCT 26.7 (L) 36.0 - 46.0 %  Protime-INR     Status: Abnormal   Collection Time: 06/25/21  3:06 AM  Result Value Ref Range   Prothrombin Time 16.4 (H) 11.4 - 15.2 seconds   INR 1.3 (H) 0.8 - 1.2  CBC     Status: Abnormal   Collection Time: 06/25/21  3:06 AM  Result Value Ref Range   WBC 10.7 (H) 4.0 - 10.5 K/uL   RBC 2.88 (L) 3.87 - 5.11 MIL/uL   Hemoglobin 8.8 (L) 12.0 - 15.0 g/dL   HCT 27.3 (L) 36.0 - 46.0 %   MCV 94.8 80.0 - 100.0 fL   MCH 30.6 26.0 - 34.0 pg   MCHC 32.2 30.0 - 36.0 g/dL   RDW 13.8 11.5  - 15.5 %   Platelets 220 150 - 400 K/uL   nRBC 0.0 0.0 - 0.2 %    ASSESSMENT: Beth Marshall is a 56 y.o. female, 6 Days Post-Op s/p INTRAMEDULLARY NAIL LEFT TIBIA NON-OP MANAGEMENT RIGHT NAVICULAR AVULSION FRACTURE  CV/Blood loss: Hgb 8.8 this AM.    PLAN: Weightbearing: TDWB LLE. WBAT RLE ROM: Ok for unrestricted motion BLE  Incisional and dressing care: Change LLE dressings as needed Showering: Okay to begin showering, incisions may get wet Orthopedic device(s):  Hard sole shoe RLE when ambulating   Pain management:  1. Tylenol 325-650 mg q 6 hours PRN 2. Flexeril 5 mg TID 3. Percocet 5-325 mg q 4 hours PRN 4. Dilaudid 0.5-1 mg q 3 hours PRN 5. Tramadol 50 mg q 6 hours VTE prophylaxis:  Heparin bridge to Coumadin , SCDs ID: Vancomycin post op completed Foley/Lines:  No foley, KVO IVFs Impediments to Fracture Healing: Vitamin D level 14, started on D2 supplementation Dispo: PT/OT recommending SNF. TOC following, bed offer accepted at [facility]. Okay for discharge from ortho standpoint once cleared by medicine team and therapies.  I have signed and placed discharge Rx for pain medication and vitamin D supplementation in patient's chart  D/C recommendations: -  Percocet for pain control - Continue home dose Coumadin for DVT prophylaxis - Continue vitamin D2 supplementation 50,000 units q 7 days x 5 weeks   Follow - up plan: 2 weeks after discharge for wound check and repeat x-rays   Contact information:  Katha Hamming MD, Rushie Nyhan PA-C. After hours and holidays please check [email or web address] for group call information for [facility]   Gwinda Passe, PA-C [phone] (office) [email or web address]

## 2021-06-25 NOTE — TOC Progression Note (Signed)
Transition of Care [facility]) - Progression Note    Patient Details  Name: Beth Marshall MRN: [id number] Date of Birth: Jan 05, 1966  [facility] CM/SW Contact  Joanne Chars, [place] Phone Number: 06/25/2021, 10:59 AM  Clinical Narrative:   Confirmation from [facility] that [place] bed in place, plan for DC tomorrow.     Expected Discharge Plan: [place] Barriers to Discharge: Continued Medical Work up, SNF Pending bed offer  Expected Discharge Plan and Services Expected Discharge Plan: [place] In-house Referral: Clinical Social Work   Post Acute Care Choice: [place] Living arrangements for the past 2 months: Single Family Home                                       Social Determinants of Health (SDOH) Interventions    Readmission Risk Interventions     No data to display

## 2021-06-26 DIAGNOSIS — I4891 Unspecified atrial fibrillation: Secondary | ICD-10-CM | POA: Diagnosis not present

## 2021-06-26 DIAGNOSIS — D62 Acute posthemorrhagic anemia: Secondary | ICD-10-CM | POA: Diagnosis not present

## 2021-06-26 DIAGNOSIS — S8292XA Unspecified fracture of left lower leg, initial encounter for closed fracture: Secondary | ICD-10-CM | POA: Diagnosis not present

## 2021-06-26 DIAGNOSIS — Z86718 Personal history of other venous thrombosis and embolism: Secondary | ICD-10-CM | POA: Diagnosis not present

## 2021-06-26 LAB — BASIC METABOLIC PANEL
Anion gap: 11 (ref 5–15)
BUN: 15 mg/dL (ref 6–20)
CO2: 29 mmol/L (ref 22–32)
Calcium: 8.9 mg/dL (ref 8.9–10.3)
Chloride: 95 mmol/L — ABNORMAL LOW (ref 98–111)
Creatinine, Ser: 0.66 mg/dL (ref 0.44–1.00)
GFR, Estimated: >60 mL/min (ref >60)
Glucose, Bld: 122 mg/dL — ABNORMAL HIGH (ref 70–99)
Potassium: 4 mmol/L (ref 3.5–5.1)
Sodium: 135 mmol/L (ref 135–145)

## 2021-06-26 LAB — CBC
HCT: 28.9 % — ABNORMAL LOW (ref 36.0–46.0)
Hemoglobin: 9.1 g/dL — ABNORMAL LOW (ref 12.0–15.0)
MCH: 30 pg (ref 26.0–34.0)
MCHC: 31.5 g/dL (ref 30.0–36.0)
MCV: 95.4 fL (ref 80.0–100.0)
Platelets: 210 10*3/uL (ref 150–400)
RBC: 3.03 MIL/uL — ABNORMAL LOW (ref 3.87–5.11)
RDW: 13.5 % (ref 11.5–15.5)
WBC: 10.4 10*3/uL (ref 4.0–10.5)
nRBC: 0 % (ref 0.0–0.2)

## 2021-06-26 LAB — PROTIME-INR
INR: 1.1 (ref 0.8–1.2)
Prothrombin Time: 14.2 s (ref 11.4–15.2)

## 2021-06-26 MED ORDER — PANTOPRAZOLE SODIUM 40 MG PO TBEC
40.0000 mg | DELAYED_RELEASE_TABLET | Freq: Every day | ORAL | 0 refills | Status: AC
Start: 2021-06-27 — End: 2021-12-26

## 2021-06-26 MED ORDER — WARFARIN SODIUM 10 MG PO TABS: 10.0000 mg | ORAL_TABLET | Freq: Every day | ORAL | 0 refills | Status: DC

## 2021-06-26 MED ORDER — WARFARIN SODIUM 5 MG PO TABS: ORAL_TABLET | ORAL | 0 refills | Status: DC

## 2021-06-26 MED ORDER — WARFARIN SODIUM 5 MG PO TABS
10.0000 mg | ORAL_TABLET | Freq: Once | ORAL | Status: AC
  Administered 2021-06-26: 10 mg via ORAL
  Filled 2021-06-26: qty 2

## 2021-06-26 MED ORDER — POLYETHYLENE GLYCOL 3350 17 G PO PACK: 17.0000 g | PACK | Freq: Every day | ORAL | 0 refills | Status: AC | PRN

## 2021-06-26 MED ORDER — DOCUSATE SODIUM 100 MG PO CAPS: 100.0000 mg | ORAL_CAPSULE | Freq: Two times a day (BID) | ORAL | 0 refills | Status: AC

## 2021-06-26 MED ORDER — WARFARIN SODIUM 5 MG PO TABS: 10.0000 mg | ORAL_TABLET | Freq: Once | ORAL | Status: DC

## 2021-06-26 NOTE — TOC Transition Note (Signed)
Transition of Care [facility]) - CM/SW Discharge Note   Patient Details  Name: Beth Marshall MRN: [id number] Date of Birth: 1965-02-19  [facility] CM/SW Contact:  Joanne Chars, LCSW Phone Number: 06/26/2021, 9:51 AM   Clinical Narrative:   Pt discharging to [facility]. RN call [phone] for report.     Final next level of care: Skilled Nursing Facility Barriers to Discharge: Barriers Resolved   Patient Goals and CMS Choice Patient states their goals for this hospitalization and ongoing recovery are:: get back on my feet CMS Medicare.gov Compare Post Acute Care list provided to:: Patient Choice offered to / list presented to : Patient  Discharge Placement              Patient chooses bed at:  [facility]) Patient to be transferred to facility by: [place] Name of family member notified: mother Inez Catalina Patient and family notified of of transfer: 06/26/21  Discharge Plan and Services In-house Referral: Clinical Social Work   Post Acute Care Choice: [place]                               Social Determinants of Health (SDOH) Interventions     Readmission Risk Interventions     No data to display

## 2021-06-26 NOTE — Discharge Summary (Signed)
Physician Discharge Summary  Beth Marshall TMH:[id number] DOB: 18-May-1965 DOA: 06/18/2021  PCP: Madison Hickman, FNP  Admit date: 06/18/2021 Discharge date: 06/26/2021  Admitted From: Home Disposition: [facility] SNF  Recommendations for Outpatient Follow-up:  Follow up with PCP in 1-2 weeks Follow-up with orthopedics, Dr. Doreatha Martin in 2 weeks for wound check and repeat x-rays Restarted on Coumadin, pharmacist recommended 10 mg dose on 6/9 and 6/10 followed by resumption of her 7.5 mg dose on 6/11.  Recommend repeat INR Monday or Tuesday.  Per hematology, Dr. Benay Spice if not at therapeutic level in the next 4-5 days may bridge with Lovenox until INR therapeutic Please obtain CBC in one week to ensure hemoglobin remained stable Recommend outpatient DEXA scan   Discharge Condition: Stable CODE STATUS: Full code Diet recommendation: Heart healthy diet  History of present illness:  Beth Marshall is a 56 y.o. female with past medical history significant for history of DVT, factor V Leiden and protein S deficiency, paroxysmal atrial fibrillation on Coumadin, chronic leg pain, morbid obesity who presented to [facility] ED on 6/1 via EMS following recurrent falls and complaining of left lower extremity pain.  Patient currently lives alone and for the past week she has noticed increasing left foot pain to the point where she could barely walk.  Patient reports difficulty getting up from the toilet and then fell back onto the commode.   Patient was seen at [facility] ED 1 day PTA but reportedly had no significant findings.   In the ED, temperature 98.2 F, HR 87, RR 18, BP 105/68, SPO2 98% on room air.  WBC 10.2, hemoglobin 12.8, platelets 246.  Sodium 136, potassium 4.5, chloride 103, CO2 23, glucose 151, BUN 12, creatinine 1.11.  AST 33, ALT 20, total bilirubin 1.2.  INR 2.8.  Right knee x-ray with comminuted displaced proximal tibial shaft fracture without obvious intra-articular involvement,  comminuted displaced proximal fibular fracture.  Right foot with small fracture deformities of indeterminate age along the dorsal aspects of the right talus and right navicular bone.  Left foot with no acute fracture or dislocation.  Orthopedics was consulted.  Hospital service consulted for further evaluation management of tibial/fibular fracture.   Hospital course:  Left proximal comminuted tibial/fibular fracture Patient presenting to ED following multiple falls at home with left lower extremity pain.  Imaging notable for comminuted displaced proximal tibial shaft fracture without obvious intra-articular involvement, comminuted displaced proximal fibular fracture.  Orthopedics was consulted and patient underwent intramedullary nailing of the left proximal tibia on 06/18/2021 by Dr. Doreatha Martin.  Patient is touchdown weightbearing left lower extremity.  Outpatient follow-up with Dr. Doreatha Martin, orthopedics 2 weeks for wound check and repeat x-rays.  Discharging to SNF for further rehabilitation.   Right navicular avulsion fracture Evaluated by orthopedics, nonoperative. RLE WBAT with hard soled shoe   Vitamin D deficiency Vitamin D 25-hydroxy low at 14.99. Vitamin D 50,000 units p.o. every 7 days x 5 weeks. Will need DEXA scan outpatient in 1-2 months   Postoperative blood loss anemia Rectus sheath hemorrhage Patient was diagnosed with rectus sheath hemorrhage following surgery, unclear whether this was related to the fall at home or heparin anticoagulation.  Patient on Coumadin outpatient.  S/p 1 unit PRBC on 06/22/2021.  Evaluated by hematology, recommending holding Coumadin for a few days without bridging and then can restart if hemoglobin remains stable.  Hemoglobin remained stable and patient was restarted on Coumadin.     Hx DVT Factor V Leiden mutation Protein S  deficiency Medical hematology/oncology following as above.  Patient on Coumadin outpatient but was held for rectus sheath hematoma.   Hemoglobin has been stable now and Coumadin restarted. Per pharmacist, for discharge, suggest warfarin '10mg'$  daily 6/9- 6/10 then back to home dose 7.'5mg'$  daily starting 6/11. Suggest obtaining INR by Monday or Tuesday.  Per hematology, Dr. Benay Spice if INR not therapeutic in the next 4-5 days can start Lovenox as a bridge.  Recommend CBC 1 week.   Paroxysmal atrial fibrillation Currently in normal sinus rhythm.  Restarted Coumadin as above   Asthma Montelukast 10 mg p.o. daily, Symbicort 2 puffs daily, albuterol MDI as needed for shortness of breath/wheezing   Morbid obesity Body mass index is 71.33 kg/m.  Discussed with patient needs for aggressive lifestyle changes/weight loss as this complicates all facets of care.  Outpatient follow-up with PCP.  May benefit from bariatric evaluation outpatient.   OSA Intolerant to CPAP.  Continue 2 L nasal cannula nightly.  Anxiety/depression: Venlafaxine 75 mg p.o. daily  GERD: Protonix 40 mg p.o. daily  Discharge Diagnoses:  Principal Problem:   Fracture of lower extremity Active Problems:   History of DVT (deep vein thrombosis)   Factor V Leiden mutation (HCC)   Obesity, morbid, BMI 50 or higher ([place])   Protein S deficiency ([place])   Atrial fibrillation (HCC)   Tibia/fibula fracture, left, closed, initial encounter   Right foot sprain, initial encounter   Acute postoperative anemia due to expected blood loss   Vitamin D deficiency   Rectus sheath hematoma, initial encounter    Discharge Instructions  Discharge Instructions     Call MD for:  difficulty breathing, headache or visual disturbances   Complete by: As directed    Call MD for:  extreme fatigue   Complete by: As directed    Call MD for:  persistant dizziness or light-headedness   Complete by: As directed    Call MD for:  persistant nausea and vomiting   Complete by: As directed    Call MD for:  redness, tenderness, or signs of infection (pain, swelling, redness, odor or  green/yellow discharge around incision site)   Complete by: As directed    Call MD for:  severe uncontrolled pain   Complete by: As directed    Call MD for:  temperature >100.4   Complete by: As directed    Diet - low sodium heart healthy   Complete by: As directed    Increase activity slowly   Complete by: As directed    No wound care   Complete by: As directed       Allergies as of 06/26/2021       Reactions   Cephalexin Other (See Comments)   unknown unknown   Codeine Nausea And Vomiting   Diphenhydramine Hcl Itching   Ranitidine Rash        Medication List     STOP taking these medications    traMADol 50 MG tablet Commonly known as: ULTRAM       TAKE these medications    albuterol 108 (90 Base) MCG/ACT inhaler Commonly known as: VENTOLIN HFA Inhale 1 puff into the lungs every 6 (six) hours as needed for wheezing or shortness of breath.   augmented betamethasone dipropionate 0.05 % cream Commonly known as: DIPROLENE-AF Apply 1 application. topically 2 (two) times daily.   Belsomra 10 MG Tabs Generic drug: Suvorexant Take 1 tablet by mouth at bedtime as needed.   budesonide-formoterol 160-4.5 MCG/ACT inhaler Commonly known  as: SYMBICORT Inhale 2 puffs into the lungs daily.   calcipotriene 0.005 % cream Commonly known as: DOVONOX Apply 1 application. topically 2 (two) times daily.   cetirizine 10 MG tablet Commonly known as: ZYRTEC Take 1 tablet by mouth daily.   cyclobenzaprine 5 MG tablet Commonly known as: FLEXERIL Take 5 mg by mouth 3 (three) times daily as needed for muscle spasms.   diclofenac Sodium 1 % Gel Commonly known as: VOLTAREN Apply 2-3 g topically as needed for pain.   docusate sodium 100 MG capsule Commonly known as: COLACE Take 1 capsule (100 mg total) by mouth 2 (two) times daily for 14 days.   furosemide 40 MG tablet Commonly known as: LASIX Take 40 mg by mouth daily.   montelukast 10 MG tablet Commonly known as:  SINGULAIR Take 1 tablet by mouth daily.   oxyCODONE-acetaminophen 5-325 MG tablet Commonly known as: PERCOCET/ROXICET Take 1-2 tablets by mouth every 4 (four) hours as needed for severe pain (1 tablet moderate pain, 2 tablets severe pain). What changed:  how much to take when to take this reasons to take this   pantoprazole 40 MG tablet Commonly known as: PROTONIX Take 1 tablet (40 mg total) by mouth daily. Start taking on: June 27, 2021   polyethylene glycol 17 g packet Commonly known as: MIRALAX / GLYCOLAX Take 17 g by mouth daily as needed for mild constipation.   potassium chloride 10 MEQ tablet Commonly known as: KLOR-CON Take 10 mEq by mouth 2 (two) times daily.   SUMAtriptan 100 MG tablet Commonly known as: IMITREX Take 1 tablet by mouth as needed for migraine.   venlafaxine XR 75 MG 24 hr capsule Commonly known as: EFFEXOR-XR Take 1 capsule by mouth daily.   Vitamin D (Ergocalciferol) 1.25 MG (50000 UNIT) Caps capsule Commonly known as: DRISDOL Take 1 capsule (50,000 Units total) by mouth every 7 (seven) days.   warfarin 10 MG tablet Commonly known as: Coumadin Take as directed. If you are unsure how to take this medication, talk to your nurse or doctor. Original instructions: Take 1 tablet (10 mg total) by mouth daily at 4 PM for 2 days. What changed: You were already taking a medication with the same name, and this prescription was added. Make sure you understand how and when to take each.   warfarin 5 MG tablet Commonly known as: COUMADIN Take as directed. If you are unsure how to take this medication, talk to your nurse or doctor. Original instructions: Take 1 1/2 tablets daily or as directed by Coumadin Clinic. PLEASE KEEP UPCOMING CARDIOLOGY APPT FOR FUTURE REFILLS. Start taking on: June 28, 2021 What changed: These instructions start on June 28, 2021. If you are unsure what to do until then, ask your doctor or other care provider.        Contact  information for follow-up providers     Haddix, Thomasene Lot, MD. Schedule an appointment as soon as possible for a visit in 2 week(s).   Specialty: Orthopedic Surgery Why: for wound check and repeat x-rays Contact information: [place] [address] [phone]         Poe, [place], FNP. Schedule an appointment as soon as possible for a visit in 1 week(s).   Specialty: Family Medicine Contact information: [address] [place] [address] [phone]              Contact information for after-discharge care     Destination  [facility] .   Service: Skilled Nursing Contact information: [address] [place] [address] [phone]                    Allergies  Allergen Reactions   Cephalexin Other (See Comments)    unknown unknown    Codeine Nausea And Vomiting   Diphenhydramine Hcl Itching   Ranitidine Rash    Consultations: Orthopedics, Dr. Doreatha Martin   Procedures/Studies: CT ABDOMEN PELVIS W CONTRAST  Result Date: 06/22/2021 CLINICAL DATA:  Left lower quadrant pain radiating to the left leg. EXAM: CT ABDOMEN AND PELVIS WITH CONTRAST TECHNIQUE: Multidetector CT imaging of the abdomen and pelvis was performed using the standard protocol following bolus administration of intravenous contrast. RADIATION DOSE REDUCTION: This exam was performed according to the departmental dose-optimization program which includes automated exposure control, adjustment of the mA and/or kV according to patient size and/or use of iterative reconstruction technique. CONTRAST:  171m OMNIPAQUE IOHEXOL 300 MG/ML  SOLN COMPARISON:  CT 06/23/2014 FINDINGS: Lower chest: No acute airspace disease or pleural effusion. Hepatobiliary: Cholecystectomy. There is mild intrahepatic biliary ductal dilatation. Common bile duct measures 9 mm, normal for post cholecystectomy status. There is no focal hepatic lesion. Pancreas:  Mild fatty atrophy.  No ductal dilatation or inflammation. Spleen: Normal in size without focal abnormality. Adrenals/Urinary Tract: 2 cm right adrenal nodule is unchanged in size from 2016 exam, favoring benign adenoma. No left adrenal nodule. No hydronephrosis or perinephric edema. Homogeneous renal enhancement with symmetric excretion on delayed phase imaging. 11 mm hypodensity in the upper pole of the right kidney is unchanged from prior exam, consistent with cyst. This needs no further follow-up. No suspicious renal lesion. Punctate nonobstructing stone in the lower pole of the left kidney. Urinary bladder is physiologically distended without wall thickening. Stomach/Bowel: The stomach is distended with ingested contrast. No gastric wall thickening. No small bowel obstruction, ileus, or inflammation. The appendix is normal. Moderate volume of stool throughout the colon with colonic tortuosity. No colonic inflammation. Vascular/Lymphatic: Normal caliber abdominal aorta. Patent portal vein. Retroaortic left renal vein. No abdominopelvic adenopathy. Reproductive: Hysterectomy.  No adnexal mass Other: There is a right rectus sheath hematoma involving the lower abdominal wall that measures 7.2 cm in transverse dimension. This spans approximately 9.9 cm cranial caudal. There is mild adjacent soft tissue stranding. Trace extension into the extraperitoneal pelvis on the right. There is no evidence 2 of active extravasation. No free air. Tiny fat containing umbilical hernia. Musculoskeletal: Technically limited assessment of the lumbar spine or pelvis due to soft tissue attenuation from habitus. Allowing for this, no acute findings. IMPRESSION: 1. Moderate sized right rectus sheath hematoma. Trace extension into the extraperitoneal pelvis on the right. No evidence of active extravasation. 2. Punctate nonobstructing left renal stone. 3. Stable right adrenal nodule dating back to 2016 exam, favoring benign adenoma.  Electronically Signed   By: MKeith RakeM.D.   On: 06/22/2021 21:00   DG Abd Portable 1V  Result Date: 06/22/2021 CLINICAL DATA:  56year old female with abdominal pain. EXAM: PORTABLE ABDOMEN - 1 VIEW COMPARISON:  None Available. FINDINGS: Nearly nondiagnostic radiograph secondary to body habitus and incomplete characterization of bowel gas. Suggestion of a few small loops of mild gaseous distension of small bowel in the central abdomen. The colon appears decompressed with normal stool burden. IMPRESSION: Nonspecific bowel gas pattern. Consider CT abdomen pelvis if there is clinical concern for bowel obstruction. Electronically Signed   By: DCamillia Herter  Suttle M.D.   On: 06/22/2021 13:05   DG Tibia/Fibula Left Port  Result Date: 06/19/2021 CLINICAL DATA:  Status post ORIF left proximal tibia fracture EXAM: PORTABLE LEFT TIBIA AND FIBULA - 2 VIEW COMPARISON:  06/18/2021 left tibia radiographs FINDINGS: Near-anatomic alignment of the comminuted proximal metadiaphysis left tibia fracture status post transfixation by intramedullary rod with multiple interlocking proximal and distal screws with no evidence of hardware fracture or loosening. Non articular proximal metaphysis left fibula fracture with mild residual 4 mm lateral displacement of the distal fracture fragment. No dislocation at the left ankle or left knee on these views. No suspicious focal osseous lesions. At least moderate tricompartmental left knee osteoarthritis. Mild-to-moderate left ankle osteoarthritis. Vascular calcifications throughout the soft tissues. Small plantar left calcaneal spur. IMPRESSION: 1. Near-anatomic alignment of the comminuted proximal left tibia fracture status post ORIF, with no evidence of hardware complication. 2. Mildly displaced non articular proximal left fibula fracture. Electronically Signed   By: Ilona Sorrel M.D.   On: 06/19/2021 16:05   DG Tibia/Fibula Left  Result Date: 06/19/2021 CLINICAL DATA:  Left tibial nail  intraoperative fluoroscopy. EXAM: LEFT TIBIA AND FIBULA - 2 VIEW COMPARISON:  Left knee and left ankle radiographs 06/18/2021 FINDINGS: Images were performed intraoperatively without the presence of a radiologist. The patient is undergoing left tibial long stem intramedullary nail fixation spanning the comminuted and displaced proximal tibial metadiaphyseal fracture. There is improved anatomic alignment with persistent fracture line lucency. Redemonstration of comminuted and mildly displaced proximal fibular diaphyseal fracture. Redemonstration of chronic fracture deformity of the distal fibula. Total fluoroscopy images: 8 Total fluoroscopy time: 156 seconds Total dose: Radiation Exposure Index (as provided by the fluoroscopic device): 17.71 mGy air Kerma Please see intraoperative findings for further detail. IMPRESSION: Intraoperative fluoroscopy for left tibial ORIF. Electronically Signed   By: Yvonne Kendall M.D.   On: 06/19/2021 14:04   DG C-Arm 1-60 Min-No Report  Result Date: 06/19/2021 Fluoroscopy was utilized by the requesting physician.  No radiographic interpretation.   DG C-Arm 1-60 Min-No Report  Result Date: 06/19/2021 Fluoroscopy was utilized by the requesting physician.  No radiographic interpretation.   DG Ankle Right Port  Result Date: 06/19/2021 CLINICAL DATA:  Leg injury, pain. EXAM: PORTABLE RIGHT ANKLE - 2 VIEW COMPARISON:  Foot radiograph yesterday. FINDINGS: Bones are subjectively under mineralized. Small osseous density again seen adjacent to the dorsal navicular, age indeterminate avulsion fracture. No other fracture of the ankle. There is a well corticated density distal to the medial malleolus that is chronic. No mortise widening. Mild tibial talar degenerative change. Moderate plantar calcaneal spur. Age advanced arterial vascular calcifications. Mild generalized soft tissue edema. IMPRESSION: 1. Small osseous density adjacent to the dorsal navicular, age indeterminate avulsion  fracture. No other acute fracture of the ankle. 2. Age advanced arterial vascular calcifications. Electronically Signed   By: Keith Rake M.D.   On: 06/19/2021 01:11   CT Knee Left Wo Contrast  Result Date: 06/19/2021 CLINICAL DATA:  Proximal tibial and fibular fractures on x-rays yesterday. CT requested for operative planning. EXAM: CT OF THE LEFT KNEE WITHOUT CONTRAST TECHNIQUE: Multidetector CT imaging of the left knee was performed according to the standard protocol. Multiplanar CT image reconstructions were also generated. RADIATION DOSE REDUCTION: This exam was performed according to the departmental dose-optimization program which includes automated exposure control, adjustment of the mA and/or kV according to patient size and/or use of iterative reconstruction technique. COMPARISON:  Left knee plain films yesterday. FINDINGS: Bones/Joint/Cartilage Beam hardening  artifact due to body habitus limits fine detail in the images. The bone mineralization is osteopenic. There are acute fractures of the proximal tibia and fibula. There is a transverse oblique proximal tibial shaft fracture beginning 6 cm below the tibial joint line with multiple comminution fragments along the anteromedial fracture margins and additional posterior comminution fragments. The comminution fragments demonstrate spreading around the fracture site, and the main distal tibial fragment is displaced laterally by up to 1/2 of a shaft width and anteriorly by about 1/3 of a shaft width with mild angulation towards the medial aspect. Just above this level is a comminuted transverse oblique fracture of the neck and proximal shaft of the fibula, with mild spreading of the comminution fragments, the distal fragment displaced laterally by an entire bone width and anteriorly by 1/2 of a shaft width, and the main distal fragment also mildly medially angulated. There is no tibial plateau fracture and no fracture line is seen extending into the  proximal tibiofibular joint. There is advanced tricompartmental degenerative arthrosis of the knee with bulky reactive osteophytes and joint space loss greatest in the medial femorotibial joint where it is bone-on-bone. There is a small to moderate suprapatellar bursal effusion but it is low in density and not suspicious for hemarthrosis. There is a 1.5 cm osteochondral loose body in the anterior central femorotibial joint space and a posterolateral fabella. Ligaments Suboptimally assessed by CT. Muscles and Tendons The tendons not well seen due to beam hardening but grossly intact as far as visualized. The imaged portion of the lower extremity demonstrates normal muscle bulk. There is no appreciable intramuscular hematoma in the proximal foreleg. Soft tissues There is moderate nonlocalizing patchy subcutaneous hemorrhage in the anterior proximal foreleg at the level of the fractures. There are calcifications in the popliteal artery. There are subcutaneous calcifications in the anterior proximal foreleg which are probably phleboliths. IMPRESSION: 1. Proximal tibial and fibular fractures as described above with comminution, and with medial angulation of the distal fragments. 2. Advanced tricompartmental degenerative arthrosis of the knee, with a 1.5 cm loose body in the anterior central joint space and a small to moderate low-density suprapatellar bursal effusion. 3. Moderate nonlocalizing subcutaneous soft tissue hemorrhage in the anterior foreleg at the level of the fractures. No appreciable intramuscular hematoma. 4. Calcifications in the popliteal artery. Greater than usually expected at this age. Electronically Signed   By: Telford Nab M.D.   On: 06/19/2021 01:11   DG Ankle Complete Left  Addendum Date: 06/18/2021   ADDENDUM REPORT: 06/18/2021 23:38 ADDENDUM: A superficial soft tissue defect is seen along the anterior aspect of the left ankle at the level of the distal left tibial shaft. Electronically  Signed   By: Virgina Norfolk M.D.   On: 06/18/2021 23:38   Result Date: 06/18/2021 CLINICAL DATA:  Status post fall. EXAM: LEFT ANKLE COMPLETE - 3+ VIEW COMPARISON:  None Available. FINDINGS: There is no evidence of an acute fracture, dislocation, or joint effusion. Chronic fracture deformities are seen involving the left lateral malleolus and left medial malleolus. Degenerative changes are seen along the dorsal aspect of the proximal and mid left foot. There is mild to moderate severity vascular calcification. Soft tissues are otherwise unremarkable. IMPRESSION: 1. No acute fracture or dislocation. 2. Chronic fracture deformities of the left lateral malleolus and left medial malleolus. Electronically Signed: By: Virgina Norfolk M.D. On: 06/18/2021 23:26   DG Foot Complete Right  Result Date: 06/18/2021 CLINICAL DATA:  Status post fall. EXAM: RIGHT  FOOT COMPLETE - 3+ VIEW COMPARISON:  None Available. FINDINGS: Small fracture deformities of indeterminate age are seen along the dorsal aspects of the right talus and right navicular bone. There is no evidence of dislocation. A small to moderate sized plantar calcaneal spur is seen. Mild dorsal soft tissue swelling is noted. IMPRESSION: 1. Small fracture deformities of indeterminate age along the dorsal aspects of the right talus and right navicular bone. Correlation with physical examination is recommended to determine the presence of point tenderness. 2. Small to moderate-sized plantar calcaneal spur. 3. Mild dorsal soft tissue swelling. Electronically Signed   By: Virgina Norfolk M.D.   On: 06/18/2021 23:35   DG Tibia/Fibula Left  Result Date: 06/18/2021 CLINICAL DATA:  Status post fall. EXAM: LEFT TIBIA AND FIBULA - 2 VIEW COMPARISON:  None Available. FINDINGS: Acute fracture deformities are seen involving the proximal shafts of the left tibia and left fibula. Medial angulation of the distal fracture sites is seen. There is no evidence of dislocation.  Chronic fracture deformities are noted along the visualized portion of the left medial malleolus and left lateral malleolus. Soft tissue swelling is seen at the level of the previously noted acute fractures. IMPRESSION: Acute fracture of the proximal shafts of the left tibia and left fibula. Electronically Signed   By: Virgina Norfolk M.D.   On: 06/18/2021 23:31   DG Knee Complete 4 Views Left  Result Date: 06/18/2021 CLINICAL DATA:  Post fall with deformity. EXAM: LEFT KNEE - COMPLETE 4+ VIEW COMPARISON:  None Available. FINDINGS: Comminuted proximal tibial shaft fracture. Tibial shaft is displaced laterally with respect to the proximal tibia. No obvious intra-articular involvement. Comminuted displaced proximal fibular fracture just proximal to the tibial fracture site. Moderate underlying knee osteoarthritis. Cannot assess for knee joint effusion on provided views. IMPRESSION: 1. Comminuted displaced proximal tibial shaft fracture without obvious intra-articular involvement. 2. Comminuted displaced proximal fibular fracture. Electronically Signed   By: Keith Rake M.D.   On: 06/18/2021 23:29   DG Foot Complete Left  Result Date: 06/18/2021 CLINICAL DATA:  Status post fall. EXAM: LEFT FOOT - COMPLETE 3+ VIEW COMPARISON:  None Available. FINDINGS: There is no evidence of an acute fracture or dislocation. Mild to moderate severity degenerative changes are seen along the dorsal aspect of the proximal to mid left foot. A superficial soft tissue defect is seen along the anterior aspect of the distal left tibial shaft. IMPRESSION: 1. No acute fracture or dislocation. 2. Mild to moderate severity degenerative changes. 3. Superficial soft tissue defect along the anterior aspect of the distal left tibial shaft. Electronically Signed   By: Virgina Norfolk M.D.   On: 06/18/2021 23:29     Subjective: Patient seen examined bedside, resting comfortably.  Lying in bed.  Complaining of pain/soreness tube  bilateral lower extremities.  Seen by orthopedics yesterday and amount of pain and swelling is as expected postoperatively.  Stable for discharge to SNF for further rehabilitation.  No other questions or concerns at this time.  Denies headache, no dizziness, no chest pain, no shortness of breath, no palpitations, no abdominal pain, no nausea/vomiting/diarrhea, no fever/chills/night sweats, no cough/congestion, no focal weakness, no fatigue, no paresthesias.  No acute events overnight per nurse staff.  Discharge Exam: Vitals:   06/26/21 0406 06/26/21 0853  BP: (!) 116/52 108/62  Pulse: 98 92  Resp: 15   Temp: 98.5 F (36.9 C) 98.4 F (36.9 C)  SpO2: 97% 95%   Vitals:   06/25/21 1153 06/25/21 1955  06/26/21 0406 06/26/21 0853  BP: (!) 115/58 116/69 (!) 116/52 108/62  Pulse:  93 98 92  Resp:  14 15   Temp: 98.3 F (36.8 C) 98.3 F (36.8 C) 98.5 F (36.9 C) 98.4 F (36.9 C)  TempSrc: Oral Oral Oral Oral  SpO2:  97% 97% 95%  Weight:      Height:        Physical Exam: GEN: NAD, alert and oriented x 3, morbidly obese, appears older than stated age HEENT: NCAT, PERRL, EOMI, sclera clear, MMM PULM: CTAB w/o wheezes/crackles, normal respiratory effort, on room air CV: RRR w/o M/G/R GI: abd soft, NTND, NABS, no R/G/M MSK: Left lower extremity with surgical incision sites noted, clean/dry/intact, no surrounding erythema/fluctuance or purulent discharge, mild swelling throughout the left lower extremity as expected with ecchymosis and mild tenderness to palpation of the left knee, neurovascularly intact no peripheral edema, muscle strength globally intact 5/5 bilateral upper/lower extremities NEURO: CN II-XII intact, no focal deficits, sensation to light touch intact PSYCH: normal mood/affect Integumentary: dry/intact, no rashes or wounds    The results of significant diagnostics from this hospitalization (including imaging, microbiology, ancillary and laboratory) are listed below for  reference.     Microbiology: Recent Results (from the past 240 hour(s))  Surgical pcr screen     Status: None   Collection Time: 06/19/21 11:21 AM   Specimen: Nasal Mucosa; Nasal Swab  Result Value Ref Range Status   MRSA, PCR NEGATIVE NEGATIVE Final   Staphylococcus aureus NEGATIVE NEGATIVE Final    Comment: (NOTE) The Xpert SA Assay (FDA approved for NASAL specimens in patients 86 years of age and older), is one component of a comprehensive surveillance program. It is not intended to diagnose infection nor to guide or monitor treatment. Performed at [facility], [place] [address] [place], [place] [address]      Labs: BNP (last 3 results) No results for input(s): "BNP" in the last 8760 hours. Basic Metabolic Panel: Recent Labs  Lab 06/20/21 0306 06/21/21 0215 06/26/21 0503  NA 137 135 135  K 3.7 3.8 4.0  CL 104 103 95*  CO2 '26 27 29  '$ GLUCOSE 193* 138* 122*  BUN '10 14 15  '$ CREATININE 0.62 0.68 0.66  CALCIUM 8.6* 8.6* 8.9   Liver Function Tests: Recent Labs  Lab 06/22/21 0055  AST 16  ALT 17  ALKPHOS 54  BILITOT 0.5  PROT 5.3*  ALBUMIN 2.4*   Recent Labs  Lab 06/22/21 0055  LIPASE 30   No results for input(s): "AMMONIA" in the last 168 hours. CBC: Recent Labs  Lab 06/22/21 0055 06/22/21 2240 06/23/21 0302 06/23/21 1446 06/23/21 2219 06/24/21 0552 06/24/21 1347 06/25/21 0306 06/26/21 0503  WBC 9.7  --  8.7  --   --  10.7*  --  10.7* 10.4  HGB 8.1*   < > 8.4*   < > 8.6* 8.9* 8.8* 8.8* 9.1*  HCT 25.1*   < > 27.0*   < > 26.7* 27.2* 26.7* 27.3* 28.9*  MCV 95.8  --  95.1  --   --  93.5  --  94.8 95.4  PLT 172  --  201  --   --  201  --  220 210   < > = values in this interval not displayed.   Cardiac Enzymes: No results for input(s): "CKTOTAL", "CKMB", "CKMBINDEX", "TROPONINI" in the last 168 hours. BNP: Invalid input(s): "POCBNP" CBG: No results for input(s): "GLUCAP" in the last 168 hours.  D-Dimer No results for input(s): "DDIMER" in  the last 72 hours. Hgb A1c No results for input(s): "HGBA1C" in the last 72 hours. Lipid Profile No results for input(s): "CHOL", "HDL", "LDLCALC", "TRIG", "CHOLHDL", "LDLDIRECT" in the last 72 hours. Thyroid function studies No results for input(s): "TSH", "T4TOTAL", "T3FREE", "THYROIDAB" in the last 72 hours.  Invalid input(s): "FREET3" Anemia work up No results for input(s): "VITAMINB12", "FOLATE", "FERRITIN", "TIBC", "IRON", "RETICCTPCT" in the last 72 hours. Urinalysis    Component Value Date/Time   COLORURINE AMBER (A) 05/07/2014 2150   APPEARANCEUR CLEAR 05/07/2014 2150   LABSPEC 1.025 05/07/2014 2150   PHURINE 5.5 05/07/2014 2150   GLUCOSEU NEGATIVE 05/07/2014 2150   HGBUR NEGATIVE 05/07/2014 2150   BILIRUBINUR SMALL (A) 05/07/2014 2150   KETONESUR 15 (A) 05/07/2014 2150   PROTEINUR NEGATIVE 05/07/2014 2150   UROBILINOGEN 1.0 05/07/2014 2150   NITRITE NEGATIVE 05/07/2014 2150   LEUKOCYTESUR NEGATIVE 05/07/2014 2150   Sepsis Labs Recent Labs  Lab 06/23/21 0302 06/24/21 0552 06/25/21 0306 06/26/21 0503  WBC 8.7 10.7* 10.7* 10.4   Microbiology Recent Results (from the past 240 hour(s))  Surgical pcr screen     Status: None   Collection Time: 06/19/21 11:21 AM   Specimen: Nasal Mucosa; Nasal Swab  Result Value Ref Range Status   MRSA, PCR NEGATIVE NEGATIVE Final   Staphylococcus aureus NEGATIVE NEGATIVE Final    Comment: (NOTE) The Xpert SA Assay (FDA approved for NASAL specimens in patients 56 years of age and older), is one component of a comprehensive surveillance program. It is not intended to diagnose infection nor to guide or monitor treatment. Performed at [facility], [place] [address], [place], [place] [address]      Time coordinating discharge: Over 30 minutes  SIGNED:   Kenric Ginger J [place] DO  [facility] 06/26/2021, 9:19 AM

## 2021-06-26 NOTE — Progress Notes (Signed)
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, atrial fibrillation  Allergies  Allergen Reactions   Cephalexin Other (See Comments)    unknown unknown    Codeine Nausea And Vomiting   Diphenhydramine Hcl Itching   Ranitidine Rash    Patient Measurements: Height: '5\' 2"'$  (157.5 cm) Weight: (!) 176.9 kg (390 lb) IBW/kg (Calculated) : 50.1 HEPARIN DW (KG): 105 kg   Vital Signs: Temp: 98.4 F (36.9 C) (06/09 0853) Temp Source: Oral (06/09 0853) BP: 108/62 (06/09 0853) Pulse Rate: 92 (06/09 0853)  Labs: Recent Labs    06/24/21 0552 06/24/21 1347 06/25/21 0306 06/26/21 0503  HGB 8.9* 8.8* 8.8* 9.1*  HCT 27.2* 26.7* 27.3* 28.9*  PLT 201  --  220 210  LABPROT 16.7*  --  16.4* 14.2  INR 1.4*  --  1.3* 1.1  CREATININE  --   --   --  0.66    Estimated Creatinine Clearance: 129.4 mL/min (by C-G formula based on SCr of 0.66 mg/dL).   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 ([place]) 10/27/2015   Dizziness 02/18/2015   Edema of extremities 02/18/2015   Elevated blood pressure reading 03/20/2015   Encounter for screening mammogram for breast cancer 10/27/2015   Factor V Leiden mutation ([place]) 10/27/2015   Gastroesophageal reflux disease 02/18/2015   Generalized anxiety disorder 02/18/2015   High risk medication use 07/25/2015   History of DVT (deep vein thrombosis) 03/15/2018   Hypokalemia 05/16/2015   Impaired fasting glucose 02/18/2015   Insomnia 02/18/2015   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   Lesion of ovary 02/18/2015   Lipoma 02/18/2015   Long term current use of anticoagulant therapy 03/20/2015   Migraine without aura and without status  migrainosus, not intractable 06/25/2015   Moderate episode of recurrent major depressive disorder ([place]) 08/02/2018   Obesity, morbid, BMI 50 or higher ([place]) 02/18/2015   OSA (obstructive sleep apnea) 02/18/2015   Osteoarthritis 02/18/2015   Primary osteoarthritis of both knees 08/30/2017   Primary osteoarthritis of left knee 04/22/2015   Protein S deficiency ([place]) 02/18/2015   Shortness of breath 02/18/2015   Tachycardia 07/01/2015   Tachycardia 07/01/2015     Assessment: Pt on warfarin PTA for history of DVT with factor V Leiden and protein S deficiency, and atrial fibrillation. Admission INR 2.7. Reversed with vit K '5mg'$  IV 6/2 for OR for IM nailing. Patient developed  rectus sheath hematoma 6/5 post-op so heparin and warfarin held. Per Oncology, heparin bridge is not needed. Per teams, ok to restart warfarin without heparin on 6/8.    Home warfarin dose: 7.'5mg'$  daily     INR 1.1. H/H, plt stable. Eating 100% of meals. Planning discharge to SNF.   Goal of Therapy:  INR 2-3  Monitor platelets by anticoagulation protocol: Yes   Plan:  For discharge, suggest warfarin '10mg'$  daily 6/9- 6/10 then back to home dose 7.'5mg'$  daily starting 6/11. Suggest obtaining INR by Monday or Tuesday.     Monitor daily INR, CBC Monitor for signs/symptoms of bleeding    Benetta Spar, PharmD, BCPS, BCCP Clinical Pharmacist  Please check AMION for all [facility] phone numbers After 10:00 PM,  call [place] [phone]

## 2021-06-26 NOTE — Progress Notes (Signed)
Occupational Therapy Treatment Patient Details Name: Beth Marshall MRN: [id number] DOB: 1966-01-09 Today's Date: 06/26/2021   History of present illness pt is 56 y.o. 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 pt found with L proximal tibia fracture, now POD 1 IMN with TDWB to LLE. R foot fxs to be treated nonoperatively.   OT comments  Pt. Seen for skilled OT treatment. Pt. Educated and able to return demo for B UE exercises to aide in strengthening to increase independence and safety with bed mobilty and prep for advancing abilities with adl completion.  Pt. Tolerated well.  Encouraged to complete several times a day.  Note d/c pending later today.     Recommendations for follow up therapy are one component of a multi-disciplinary discharge planning process, led by the attending physician.  Recommendations may be updated based on patient status, additional functional criteria and insurance authorization.    Follow Up Recommendations  Skilled nursing-short term rehab (<3 hours/day)    Assistance Recommended at Discharge Frequent or constant Supervision/Assistance  Patient can return home with the following  A lot of help with bathing/dressing/bathroom   Equipment Recommendations       Recommendations for Other Services      Precautions / Restrictions Precautions Precautions: Fall Precaution Comments: super morbid obesity Restrictions Weight Bearing Restrictions: Yes RLE Weight Bearing: Non weight bearing LLE Weight Bearing: Touchdown weight bearing       Mobility Bed Mobility                    Transfers                         Balance                                           ADL either performed or assessed with clinical judgement   ADL                                               Extremity/Trunk Assessment              Vision       Perception     Praxis      Cognition Arousal/Alertness: Awake/alert Behavior During Therapy: Anxious Overall Cognitive Status: Within Functional Limits for tasks assessed                                 General Comments: required assistance to calm, notable anxiety with pending d/c. assisted with breathing strategies and calming tech. to focus on "now" vs. worry of multiple other things        Exercises General Exercises - Upper Extremity Shoulder Flexion: AROM, Both, 5 reps, Supine Shoulder Extension: AROM, Both, 5 reps, Supine Elbow Extension: AROM, Both, 5 reps, Supine Wrist Flexion: AROM, Both, 5 reps, Supine Other Exercises Other Exercises: educated on how to use bed rails to aide in exercises.  able to simulate pulling L/R-able to return demo 5 reps Other Exercises: educated on  using lower bed rails to pull up/forward, able to return demo 5 reps    Shoulder Instructions       General Comments      Pertinent Vitals/ Pain       Pain Assessment Pain Assessment: Faces Faces Pain Scale: Hurts even more Pain Location: points to L/R sides of abdomen Pain Descriptors / Indicators: Discomfort, Grimacing, Guarding, Sore Pain Intervention(s): Limited activity within patient's tolerance, Monitored during session  Home Living                                          Prior Functioning/Environment              Frequency  Min 2X/week        Progress Toward Goals  OT Goals(current goals can now be found in the care plan section)  Progress towards OT goals: Progressing toward goals     Plan Discharge plan remains appropriate    Co-evaluation                 AM-PAC OT "6 Clicks" Daily Activity     Outcome Measure   Help from another person eating meals?: None Help from another person taking care of personal  grooming?: A Little Help from another person toileting, which includes using toliet, bedpan, or urinal?: Total Help from another person bathing (including washing, rinsing, drying)?: A Lot Help from another person to put on and taking off regular upper body clothing?: A Little Help from another person to put on and taking off regular lower body clothing?: Total 6 Click Score: 14    End of Session    OT Visit Diagnosis: Other abnormalities of gait and mobility (R26.89);History of falling (Z91.81);Repeated falls (R29.6);Muscle weakness (generalized) (M62.81)   Activity Tolerance Patient tolerated treatment well   Patient Left in bed;with call bell/phone within reach;with family/visitor present   Nurse Communication Other (comment) (alerted rn pt. requesting a bath)        Time: 0768-0881 OT Time Calculation (min): 11 min  Charges: OT General Charges $OT Visit: 1 Visit OT Treatments $Therapeutic Exercise: 8-22 mins  Beth Marshall, COTA/L Acute Rehabilitation [phone]   Tanya Nones 06/26/2021, 11:58 AM

## 2021-06-27 ENCOUNTER — Encounter (HOSPITAL_COMMUNITY): Payer: Self-pay

## 2021-06-27 ENCOUNTER — Other Ambulatory Visit: Payer: Self-pay

## 2021-06-27 ENCOUNTER — Emergency Department (HOSPITAL_COMMUNITY)
Admission: EM | Admit: 2021-06-27 | Discharge: 2021-06-27 | Disposition: A | Discharge status: Skilled Nursing Facility | Payer: Medicare Other | Attending: Emergency Medicine | Admitting: Emergency Medicine

## 2021-06-27 DIAGNOSIS — Z7901 Long term (current) use of anticoagulants: Secondary | ICD-10-CM | POA: Diagnosis not present

## 2021-06-27 DIAGNOSIS — G9782 Other postprocedural complications and disorders of nervous system: Secondary | ICD-10-CM

## 2021-06-27 DIAGNOSIS — R200 Anesthesia of skin: Secondary | ICD-10-CM | POA: Insufficient documentation

## 2021-06-27 NOTE — ED Notes (Signed)
  [facility] called for patient transport back to [facility].

## 2021-06-27 NOTE — ED Provider Notes (Signed)
[facility] DEPT Provider Note   CSN: [id number] Arrival date & time: 06/27/21  1828     History  No chief complaint on file.   Beth Marshall is a 56 y.o. female.  HPI Patient is postoperative surgical repair of a comminuted displaced proximal tibial shaft fracture and fibular fracture.  Due to underlying medical conditions and severe obesity patient had complex surgical repair [id number].  Patient was discharged yesterday to SNF.  Patient reports that her left lower leg feels numb and she is having difficulty to move it.  She also reports that her bed at the SNF is uncomfortable and does not fit her well.  She feels like it is putting pressure directly on her back.  Patient is on Coumadin for paroxysmal A-fib and recurrent DVT history of factor V Leiden mutation and protein S deficiency.    Home Medications Prior to Admission medications   Medication Sig Start Date End Date Taking? Authorizing Provider  albuterol (PROVENTIL HFA;VENTOLIN HFA) 108 (90 BASE) MCG/ACT inhaler Inhale 1 puff into the lungs every 6 (six) hours as needed for wheezing or shortness of breath.    [provider]  augmented betamethasone dipropionate (DIPROLENE-AF) 0.05 % cream Apply 1 application. topically 2 (two) times daily. 07/14/17   [provider]  BELSOMRA 10 MG TABS Take 1 tablet by mouth at bedtime as needed. 06/01/21   [provider]  budesonide-formoterol (SYMBICORT) 160-4.5 MCG/ACT inhaler Inhale 2 puffs into the lungs daily.    [provider]  calcipotriene (DOVONOX) 0.005 % cream Apply 1 application. topically 2 (two) times daily. 08/17/18   [provider]  cetirizine (ZYRTEC) 10 MG tablet Take 1 tablet by mouth daily. 01/16/19   [provider]  cyclobenzaprine (FLEXERIL) 5 MG tablet Take 5 mg by mouth 3 (three) times daily as needed for muscle spasms.    [provider]  diclofenac Sodium (VOLTAREN) 1 %  GEL Apply 2-3 g topically as needed for pain. 03/03/21   [provider]  docusate sodium (COLACE) 100 MG capsule Take 1 capsule (100 mg total) by mouth 2 (two) times daily for 14 days. 06/26/21 07/10/21  [place] Donnamarie Poag, DO  furosemide (LASIX) 40 MG tablet Take 40 mg by mouth daily.    [provider]  montelukast (SINGULAIR) 10 MG tablet Take 1 tablet by mouth daily. 10/24/18   [provider]  oxyCODONE-acetaminophen (PERCOCET/ROXICET) 5-325 MG tablet Take 1-2 tablets by mouth every 4 (four) hours as needed for severe pain (1 tablet moderate pain, 2 tablets severe pain). 06/22/21   Corinne Ports, PA-C  pantoprazole (PROTONIX) 40 MG tablet Take 1 tablet (40 mg total) by mouth daily. 06/27/21 07/27/21  [place] Donnamarie Poag, DO  polyethylene glycol (MIRALAX / GLYCOLAX) 17 g packet Take 17 g by mouth daily as needed for mild constipation. 06/26/21   [place] Donnamarie Poag, DO  potassium chloride (K-DUR) 10 MEQ tablet Take 10 mEq by mouth 2 (two) times daily.    [provider]  SUMAtriptan (IMITREX) 100 MG tablet Take 1 tablet by mouth as needed for migraine. 07/18/15   [provider]  venlafaxine XR (EFFEXOR-XR) 75 MG 24 hr capsule Take 1 capsule by mouth daily. 08/02/18   [provider]  Vitamin D, Ergocalciferol, (DRISDOL) 1.25 MG (50000 UNIT) CAPS capsule Take 1 capsule (50,000 Units total) by mouth every 7 (seven) days. 06/22/21   Corinne Ports, PA-C  warfarin (COUMADIN) 10 MG tablet Take 1  tablet (10 mg total) by mouth daily at 4 PM for 1 day. Give on 6/10 (Received dose in hospital before discharge 6/9) 06/26/21 06/27/21  [place] Donnamarie Poag, DO  warfarin (COUMADIN) 5 MG tablet Take 1 1/2 tablets (7.'5mg'$ ) daily or as directed STARTING 6/11 06/28/21   [place] Donnamarie Poag, DO      Allergies    Cephalexin, Codeine, Diphenhydramine hcl, and Ranitidine    Review of Systems   Review of Systems Constitutional: No fevers no chills Respiratory: No new chest pain or shortness  of breath Physical Exam Updated Vital Signs BP 126/75   Pulse 95   Temp 98.8 F (37.1 C)   Resp 18   Ht '5\' 2"'$  (1.575 m)   Wt (!) 218.6 kg   SpO2 100%   BMI 88.16 kg/m  Physical Exam Constitutional:      Comments: Patient is alert.  No acute distress.  Mental status clear.  HENT:     Mouth/Throat:     Pharynx: Oropharynx is clear.  Eyes:     Extraocular Movements: Extraocular movements intact.  Cardiovascular:     Rate and Rhythm: Normal rate and regular rhythm.  Pulmonary:     Comments: No respiratory distress.  Anterior auscultation breath sounds are symmetric.  No wheeze or Abdominal:     Comments: Morbid obesity.  Abdomen is soft.  Inspection of the groin folds shows him to be clean and dry.  Musculoskeletal:     Comments: Patient is left lower extremity has surgical wounds that are healing well.  None appear to be secondarily infected.  Patient has large resolving hematoma forming in the dependent area of the calf.  The foot itself is warm and dry.  There is no edema of the foot.  A little bit of ankle edema.  Pencil Doppler used to get pulses on the left foot and the dorsalis pedis as well as the posterior tibial.  Skin:    General: Skin is warm and dry.  Neurological:     General: No focal deficit present.     Mental Status: She is oriented to person, place, and time.  Psychiatric:        Mood and Affect: Mood normal.        ED Results / Procedures / Treatments   Labs (all labs ordered are listed, but only abnormal results are displayed) Labs Reviewed - No data to display  EKG None  Radiology No results found.  Procedures Procedures    Medications Ordered in ED Medications - No data to display  ED Course/ Medical Decision Making/ A&P                           Medical Decision Making  Patient was discharged yesterday from the hospital.  Her surgery was 9 days ago.  Patient reports numbness of her lower leg and foot.  In terms of the surgical  appearance, the extremity looks good.  The surgical sites are clean dry and appear to be healing well.  Patient does have a very large dependent hematoma at the calf.  There is moderate edema of the lower leg.  The pedal pulses were easily identified with a pencil Doppler.  The foot is warm.  I do not suspect arterial occlusion.  I suspect with the amount of surgical changes and swelling patient is experiencing some neuralgia.  She can move the toes spontaneously.  She does not seem uncomfortable at rest.  At this time my recommendation is for close follow-up and recheck.  Patient has history of DVT and hypercoagulable condition.  She is currently on Coumadin.  Ultrasound is not available for DVT study tonight.  Orders placed for patient to return for DVT study.  At this time with the patient already anticoagulated I believe it safe for her to be discharged and follow-up for DVT study.        Final Clinical Impression(s) / ED Diagnoses Final diagnoses:  Post-operative numbness    Rx / DC Orders ED Discharge Orders     None         Charlesetta Shanks, MD 06/27/21 2053

## 2021-06-27 NOTE — ED Triage Notes (Signed)
Patient Beth Marshall c/o left leg pain. Patient had surgery on the second of June. Patient states pain has progressively worsened. Patient also c/o numbness and tingling sensation in the left leg.

## 2021-06-27 NOTE — Discharge Instructions (Signed)
1.  At this time, your postoperative wounds appear to be in good condition.  They do not look infected. 2.  You are reporting some numbness in your lower leg and foot.  At this time this may be from extensive surgery and some compression on the nerves from swelling of the leg..  Your pulses are present and your foot is warm.  This should continue to be monitored closely at your facility.  Should also have a follow-up with your orthopedic surgeon or their office within the next couple of days for recheck. 3.  An order has been placed for you to get a ultrasound to evaluate for blood clot in the veins.  You are on a blood thinner at this time making this much less likely.  However, a follow-up ultrasound will be ordered.

## 2021-06-29 ENCOUNTER — Ambulatory Visit (HOSPITAL_COMMUNITY): Admission: RE | Admit: 2021-06-29 | Payer: Medicare Other | Source: Ambulatory Visit

## 2021-07-02 ENCOUNTER — Ambulatory Visit (HOSPITAL_COMMUNITY)
Admission: RE | Admit: 2021-07-02 | Discharge: 2021-07-02 | Disposition: A | Discharge status: Home / Self Care | Payer: No Typology Code available for payment source | Source: Ambulatory Visit | Attending: Emergency Medicine | Admitting: Emergency Medicine

## 2021-07-02 ENCOUNTER — Ambulatory Visit: Payer: Medicare Other | Admitting: Cardiology

## 2021-07-02 ENCOUNTER — Telehealth: Payer: Self-pay

## 2021-07-02 DIAGNOSIS — S82202A Unspecified fracture of shaft of left tibia, initial encounter for closed fracture: Secondary | ICD-10-CM | POA: Diagnosis not present

## 2021-07-02 DIAGNOSIS — R52 Pain, unspecified: Secondary | ICD-10-CM | POA: Diagnosis not present

## 2021-07-02 DIAGNOSIS — S8292XA Unspecified fracture of left lower leg, initial encounter for closed fracture: Secondary | ICD-10-CM

## 2021-07-02 DIAGNOSIS — Y939 Activity, unspecified: Secondary | ICD-10-CM | POA: Insufficient documentation

## 2021-07-02 DIAGNOSIS — Y929 Unspecified place or not applicable: Secondary | ICD-10-CM | POA: Diagnosis not present

## 2021-07-02 NOTE — Telephone Encounter (Signed)
Returned pt's call. Pt wanted to make me aware she was discharged from hospital and is now in rehab at [facility]. Warfarin/INR is being managed by facility. I made pt aware when she is discharged from facility, an appt should be scheduled with anticoagulation clinic and cardiologist. Pt verbalized understanding.

## 2021-07-02 NOTE — Progress Notes (Addendum)
VASCULAR LAB    Left lower extremity venous duplex has been performed.  See CV proc for preliminary results.  Left message on voicemail  Sharion Dove, RVT 07/02/2021, 8:41 AM

## 2021-07-07 ENCOUNTER — Telehealth: Payer: Self-pay

## 2021-07-07 NOTE — Telephone Encounter (Signed)
Patient would like to speak to you about her coumadin.

## 2021-07-07 NOTE — Telephone Encounter (Signed)
Returned pt's call. Pt called to make me aware that she is still in rehab and they are checking her INR/dosing her Warfarin. I thanked pt for giving me an update and made her aware that the anticoagulation clinic will resume management when she is discharged from rehab. Pt verbalized understanding.

## 2021-08-04 ENCOUNTER — Encounter (HOSPITAL_COMMUNITY): Payer: Self-pay | Admitting: Emergency Medicine

## 2021-08-04 ENCOUNTER — Emergency Department (HOSPITAL_COMMUNITY): Payer: Medicare Other

## 2021-08-04 ENCOUNTER — Other Ambulatory Visit: Payer: Self-pay

## 2021-08-04 ENCOUNTER — Emergency Department (HOSPITAL_COMMUNITY)
Admission: EM | Admit: 2021-08-04 | Discharge: 2021-08-04 | Disposition: A | Discharge status: Home / Self Care | Payer: Medicare Other | Attending: Emergency Medicine | Admitting: Emergency Medicine

## 2021-08-04 DIAGNOSIS — R300 Dysuria: Secondary | ICD-10-CM | POA: Diagnosis not present

## 2021-08-04 DIAGNOSIS — M7981 Nontraumatic hematoma of soft tissue: Secondary | ICD-10-CM | POA: Diagnosis not present

## 2021-08-04 DIAGNOSIS — S301XXA Contusion of abdominal wall, initial encounter: Secondary | ICD-10-CM

## 2021-08-04 DIAGNOSIS — R109 Unspecified abdominal pain: Secondary | ICD-10-CM | POA: Insufficient documentation

## 2021-08-04 DIAGNOSIS — Z7901 Long term (current) use of anticoagulants: Secondary | ICD-10-CM | POA: Diagnosis not present

## 2021-08-04 DIAGNOSIS — R112 Nausea with vomiting, unspecified: Secondary | ICD-10-CM | POA: Insufficient documentation

## 2021-08-04 DIAGNOSIS — R791 Abnormal coagulation profile: Secondary | ICD-10-CM

## 2021-08-04 LAB — I-STAT CHEM 8, ED
BUN: 10 mg/dL (ref 6–20)
Calcium, Ion: 1.09 mmol/L — ABNORMAL LOW (ref 1.15–1.40)
Chloride: 96 mmol/L — ABNORMAL LOW (ref 98–111)
Creatinine, Ser: 0.6 mg/dL (ref 0.44–1.00)
Glucose, Bld: 112 mg/dL — ABNORMAL HIGH (ref 70–99)
HCT: 32 % — ABNORMAL LOW (ref 36.0–46.0)
Hemoglobin: 10.9 g/dL — ABNORMAL LOW (ref 12.0–15.0)
Potassium: 3.4 mmol/L — ABNORMAL LOW (ref 3.5–5.1)
Sodium: 136 mmol/L (ref 135–145)
TCO2: 30 mmol/L (ref 22–32)

## 2021-08-04 LAB — COMPREHENSIVE METABOLIC PANEL
ALT: 11 U/L (ref 0–44)
AST: 19 U/L (ref 15–41)
Albumin: 2.8 g/dL — ABNORMAL LOW (ref 3.5–5.0)
Alkaline Phosphatase: 99 U/L (ref 38–126)
Anion gap: 12 (ref 5–15)
BUN: 10 mg/dL (ref 6–20)
CO2: 28 mmol/L (ref 22–32)
Calcium: 9.2 mg/dL (ref 8.9–10.3)
Chloride: 97 mmol/L — ABNORMAL LOW (ref 98–111)
Creatinine, Ser: 0.65 mg/dL (ref 0.44–1.00)
GFR, Estimated: >60 mL/min (ref >60)
Glucose, Bld: 117 mg/dL — ABNORMAL HIGH (ref 70–99)
Potassium: 3.3 mmol/L — ABNORMAL LOW (ref 3.5–5.1)
Sodium: 137 mmol/L (ref 135–145)
Total Bilirubin: 0.5 mg/dL (ref 0.3–1.2)
Total Protein: 6.8 g/dL (ref 6.5–8.1)

## 2021-08-04 LAB — CBC WITH DIFFERENTIAL/PLATELET
Abs Immature Granulocytes: 0.08 10*3/uL — ABNORMAL HIGH (ref 0.00–0.07)
Basophils Absolute: 0.1 10*3/uL (ref 0.0–0.1)
Basophils Relative: 1 %
Eosinophils Absolute: 0.1 10*3/uL (ref 0.0–0.5)
Eosinophils Relative: 2 %
HCT: 32.6 % — ABNORMAL LOW (ref 36.0–46.0)
Hemoglobin: 9.8 g/dL — ABNORMAL LOW (ref 12.0–15.0)
Immature Granulocytes: 1 %
Lymphocytes Relative: 22 %
Lymphs Abs: 2 10*3/uL (ref 0.7–4.0)
MCH: 28.2 pg (ref 26.0–34.0)
MCHC: 30.1 g/dL (ref 30.0–36.0)
MCV: 93.7 fL (ref 80.0–100.0)
Monocytes Absolute: 1 10*3/uL (ref 0.1–1.0)
Monocytes Relative: 11 %
Neutro Abs: 6.1 10*3/uL (ref 1.7–7.7)
Neutrophils Relative %: 63 %
Platelets: 474 10*3/uL — ABNORMAL HIGH (ref 150–400)
RBC: 3.48 MIL/uL — ABNORMAL LOW (ref 3.87–5.11)
RDW: 16.3 % — ABNORMAL HIGH (ref 11.5–15.5)
WBC: 9.5 10*3/uL (ref 4.0–10.5)
nRBC: 0 % (ref 0.0–0.2)

## 2021-08-04 LAB — URINALYSIS, ROUTINE W REFLEX MICROSCOPIC
Bilirubin Urine: NEGATIVE
Glucose, UA: NEGATIVE mg/dL
Hgb urine dipstick: NEGATIVE
Ketones, ur: NEGATIVE mg/dL
Leukocytes,Ua: NEGATIVE
Nitrite: NEGATIVE
Protein, ur: NEGATIVE mg/dL
Specific Gravity, Urine: 1.014 (ref 1.005–1.030)
pH: 5 (ref 5.0–8.0)

## 2021-08-04 LAB — PROTIME-INR
INR: 4.5 (ref 0.8–1.2)
Prothrombin Time: 42.6 seconds — ABNORMAL HIGH (ref 11.4–15.2)

## 2021-08-04 LAB — LIPASE, BLOOD: Lipase: 33 U/L (ref 11–51)

## 2021-08-04 MED ORDER — MORPHINE SULFATE (PF) 4 MG/ML IV SOLN
4.0000 mg | Freq: Once | INTRAVENOUS | Status: AC
  Administered 2021-08-04: 4 mg via INTRAVENOUS
  Filled 2021-08-04: qty 1

## 2021-08-04 MED ORDER — FENTANYL CITRATE PF 50 MCG/ML IJ SOSY
50.0000 ug | PREFILLED_SYRINGE | Freq: Once | INTRAMUSCULAR | Status: AC
  Administered 2021-08-04: 50 ug via INTRAVENOUS
  Filled 2021-08-04: qty 1

## 2021-08-04 MED ORDER — ONDANSETRON HCL 4 MG/2ML IJ SOLN
4.0000 mg | Freq: Once | INTRAMUSCULAR | Status: AC
  Administered 2021-08-04: 4 mg via INTRAVENOUS
  Filled 2021-08-04: qty 2

## 2021-08-04 MED ORDER — IOHEXOL 300 MG/ML  SOLN
100.0000 mL | Freq: Once | INTRAMUSCULAR | Status: AC | PRN
  Administered 2021-08-04: 100 mL via INTRAVENOUS

## 2021-08-04 MED ORDER — SODIUM CHLORIDE 0.9 % IV BOLUS
1000.0000 mL | Freq: Once | INTRAVENOUS | Status: AC
  Administered 2021-08-04: 1000 mL via INTRAVENOUS

## 2021-08-04 NOTE — ED Triage Notes (Signed)
Pt BIB EMS from [facility] for rehab, [facility] had transported pt to doctors office for orthopedic follow up. Pt states she has been having RUQ abd pain x 2 weeks with N/V.

## 2021-08-04 NOTE — ED Provider Notes (Signed)
[facility] EMERGENCY DEPARTMENT Provider Note   CSN: [id number] Arrival date & time: 08/04/21  1127     History  Chief Complaint  Patient presents with   Abdominal Pain    Beth Marshall is a 56 y.o. female.  56 yo F with a chief complaint of abdominal discomfort.  Been going on for about 2 weeks now with some intermittent nausea and vomiting.  She tells me that initially started in her right lower quadrant she feels like it is moved across her abdomen is now in the right upper quadrant.  She thinks has been moving her bowels okay.  Has been having some dysuria at times.  No noted fevers.  She is currently in rehab for bilateral lower leg fractures status postoperative repair.   Abdominal Pain      Home Medications Prior to Admission medications   Medication Sig Start Date End Date Taking? Authorizing Provider  albuterol (PROVENTIL HFA;VENTOLIN HFA) 108 (90 BASE) MCG/ACT inhaler Inhale 1 puff into the lungs every 6 (six) hours as needed for wheezing or shortness of breath.    [provider]  augmented betamethasone dipropionate (DIPROLENE-AF) 0.05 % cream Apply 1 application. topically 2 (two) times daily. 07/14/17   [provider]  BELSOMRA 10 MG TABS Take 1 tablet by mouth at bedtime as needed. 06/01/21   [provider]  budesonide-formoterol (SYMBICORT) 160-4.5 MCG/ACT inhaler Inhale 2 puffs into the lungs daily.    [provider]  calcipotriene (DOVONOX) 0.005 % cream Apply 1 application. topically 2 (two) times daily. 08/17/18   [provider]  cetirizine (ZYRTEC) 10 MG tablet Take 1 tablet by mouth daily. 01/16/19   [provider]  cyclobenzaprine (FLEXERIL) 5 MG tablet Take 5 mg by mouth 3 (three) times daily as needed for muscle spasms.    [provider]  diclofenac Sodium (VOLTAREN) 1 % GEL Apply 2-3 g topically as needed for pain. 03/03/21   [provider]  furosemide (LASIX)  40 MG tablet Take 40 mg by mouth daily.    [provider]  montelukast (SINGULAIR) 10 MG tablet Take 1 tablet by mouth daily. 10/24/18   [provider]  oxyCODONE-acetaminophen (PERCOCET/ROXICET) 5-325 MG tablet Take 1-2 tablets by mouth every 4 (four) hours as needed for severe pain (1 tablet moderate pain, 2 tablets severe pain). 06/22/21   Corinne Ports, PA-C  pantoprazole (PROTONIX) 40 MG tablet Take 1 tablet (40 mg total) by mouth daily. 06/27/21 07/27/21  [place] Donnamarie Poag, DO  polyethylene glycol (MIRALAX / GLYCOLAX) 17 g packet Take 17 g by mouth daily as needed for mild constipation. 06/26/21   [place] Donnamarie Poag, DO  potassium chloride (K-DUR) 10 MEQ tablet Take 10 mEq by mouth 2 (two) times daily.    [provider]  SUMAtriptan (IMITREX) 100 MG tablet Take 1 tablet by mouth as needed for migraine. 07/18/15   [provider]  venlafaxine XR (EFFEXOR-XR) 75 MG 24 hr capsule Take 1 capsule by mouth daily. 08/02/18   [provider]  Vitamin D, Ergocalciferol, (DRISDOL) 1.25 MG (50000 UNIT) CAPS capsule Take 1 capsule (50,000 Units total) by mouth every 7 (seven) days. 06/22/21   Corinne Ports, PA-C  warfarin (COUMADIN) 10 MG tablet Take 1 tablet (10 mg total) by mouth daily at 4 PM for 1 day. Give on 6/10 (Received dose in hospital before discharge 6/9) 06/26/21 06/27/21  [place] Donnamarie Poag, DO  warfarin (COUMADIN) 5 MG tablet  Take 1 1/2 tablets (7.'5mg'$ ) daily or as directed STARTING 6/11 06/28/21   [place] Donnamarie Poag, DO      Allergies    Cephalexin, Codeine, Diphenhydramine hcl, and Ranitidine    Review of Systems   Review of Systems  Gastrointestinal:  Positive for abdominal pain.    Physical Exam Updated Vital Signs BP 106/62   Pulse 74   Temp 98 F (36.7 C) (Oral)   Resp 14   Ht '5\' 2"'$  (1.575 m)   Wt (!) 218.6 kg   SpO2 100%   BMI 88.15 kg/m  Physical Exam Vitals and nursing note reviewed.  Constitutional:      General: She is not in acute  distress.    Appearance: She is well-developed. She is not diaphoretic.     Comments: BMI 88.    HENT:     Head: Normocephalic and atraumatic.  Eyes:     Pupils: Pupils are equal, round, and reactive to light.  Cardiovascular:     Rate and Rhythm: Normal rate and regular rhythm.     Heart sounds: No murmur heard.    No friction rub. No gallop.  Pulmonary:     Effort: Pulmonary effort is normal.     Breath sounds: No wheezing or rales.  Abdominal:     General: There is no distension.     Palpations: Abdomen is soft.     Tenderness: There is no abdominal tenderness.     Comments: Exam is limited secondary to body habitus.  Patient with discomfort worse in the right upper quadrant and epigastrium.  Musculoskeletal:        General: No tenderness.     Cervical back: Normal range of motion and neck supple.  Skin:    General: Skin is warm and dry.  Neurological:     Mental Status: She is alert and oriented to person, place, and time.  Psychiatric:        Behavior: Behavior normal.     ED Results / Procedures / Treatments   Labs (all labs ordered are listed, but only abnormal results are displayed) Labs Reviewed  CBC WITH DIFFERENTIAL/PLATELET - Abnormal; Notable for the following components:      Result Value   RBC 3.48 (*)    Hemoglobin 9.8 (*)    HCT 32.6 (*)    RDW 16.3 (*)    Platelets 474 (*)    Abs Immature Granulocytes 0.08 (*)    All other components within normal limits  COMPREHENSIVE METABOLIC PANEL - Abnormal; Notable for the following components:   Potassium 3.3 (*)    Chloride 97 (*)    Glucose, Bld 117 (*)    Albumin 2.8 (*)    All other components within normal limits  URINALYSIS, ROUTINE W REFLEX MICROSCOPIC - Abnormal; Notable for the following components:   APPearance HAZY (*)    All other components within normal limits  I-STAT CHEM 8, ED - Abnormal; Notable for the following components:   Potassium 3.4 (*)    Chloride 96 (*)    Glucose, Bld 112 (*)     Calcium, Ion 1.09 (*)    Hemoglobin 10.9 (*)    HCT 32.0 (*)    All other components within normal limits  LIPASE, BLOOD  PROTIME-INR    EKG None  Radiology CT ABDOMEN PELVIS W CONTRAST  Result Date: 08/04/2021 CLINICAL DATA:  Abdominal pain, acute, nonlocalized EXAM: CT ABDOMEN AND PELVIS WITH CONTRAST TECHNIQUE: Multidetector CT imaging of the abdomen  and pelvis was performed using the standard protocol following bolus administration of intravenous contrast. RADIATION DOSE REDUCTION: This exam was performed according to the departmental dose-optimization program which includes automated exposure control, adjustment of the mA and/or kV according to patient size and/or use of iterative reconstruction technique. CONTRAST:  152m OMNIPAQUE IOHEXOL 300 MG/ML  SOLN COMPARISON:  June 22, 2021 FINDINGS: Lower chest: No acute abnormality. Hepatobiliary: No focal liver abnormality is seen. Status post cholecystectomy. No biliary dilatation. Pancreas: Unremarkable. No pancreatic ductal dilatation or surrounding inflammatory changes. Spleen: Normal in size without focal abnormality. Adrenals/Urinary Tract: Left adrenal and kidneys have a normal appearance except for a punctate calcified density at the left lower renal sinus stable and likely cyst at the upper pole of the right kidney measuring approximately 1.4 cm without significant interval change. Again seen is the 2.4 x 2 cm nodule at the right adrenal without significant interval change likely an adenoma. Stomach/Bowel: Small hiatal hernia. Stomach is within normal limits. Appendix is not well delineated. No evidence of bowel wall thickening, distention, or inflammatory changes. Vascular/Lymphatic: No significant vascular findings are present. No enlarged abdominal or pelvic lymph nodes. Reproductive: Status post hysterectomy. No adnexal masses. Other: There is a tiny fluid in the dependent pelvis. Musculoskeletal: There is a right rectus sheath hematoma  which has decreased at the region of the pelvis measuring approximally 3 cm in the AP dimension in comparison to 7.2 cm on the previous study. However, rectus sheath hematoma has extended superiorly to the mid to-lower abdomen measuring approximately 6 cm AP x 11.8 cm transverse x 16 cm craniocaudad. IMPRESSION: 1. There is a right rectus sheath hematoma which has extended up to the mid to-lower abdomen in comparison to the rectus sheath hematoma confined to the pelvis in the previous study. Rectus sheath hematoma measures approximately 6 x 11.8 x 16 cm which appears subacute. 2. 2.4 x 2 cm nodule in the right adrenal is stable likely an adenoma. 3.  Small fluid in the dependent pelvis. Electronically Signed   By: AFrazier RichardsM.D.   On: 08/04/2021 14:26    Procedures Procedures    Medications Ordered in ED Medications  sodium chloride 0.9 % bolus 1,000 mL (0 mLs Intravenous Stopped 08/04/21 1556)  morphine (PF) 4 MG/ML injection 4 mg (4 mg Intravenous Given 08/04/21 1154)  ondansetron (ZOFRAN) injection 4 mg (4 mg Intravenous Given 08/04/21 1154)  iohexol (OMNIPAQUE) 300 MG/ML solution 100 mL (100 mLs Intravenous Contrast Given 08/04/21 1356)    ED Course/ Medical Decision Making/ A&P                           Medical Decision Making Amount and/or Complexity of Data Reviewed Labs: ordered. Radiology: ordered.  Risk Prescription drug management.   56yo F with a chief complaint of abdominal discomfort.  Going on for couple weeks.  Of some nausea and vomiting with that.  On my exam she does have some significant abdominal discomfort.  We will give pain and nausea medicine.  CT scan.  Reassess.  CT scan with a rectus sheath hematoma.  This has expanded since last CT scan.  The patient's hemoglobin actually is higher than it was last time.  No significant electrolyte abnormality.  UA negative for infection is independently interpreted by me.  Patient does appear to be on Coumadin.   Unfortunately forgot to check an INR initially.  We will order now.  Per the patient it  was 5 a couple days ago.  Plan to discuss with general surgery after INR is resulted.  Patient care was signed out to Dr. Roslynn Amble, please see his note for further details care in the ED.  The patients results and plan were reviewed and discussed.   Any x-rays performed were independently reviewed by myself.   Differential diagnosis were considered with the presenting HPI.  Medications  sodium chloride 0.9 % bolus 1,000 mL (0 mLs Intravenous Stopped 08/04/21 1556)  morphine (PF) 4 MG/ML injection 4 mg (4 mg Intravenous Given 08/04/21 1154)  ondansetron (ZOFRAN) injection 4 mg (4 mg Intravenous Given 08/04/21 1154)  iohexol (OMNIPAQUE) 300 MG/ML solution 100 mL (100 mLs Intravenous Contrast Given 08/04/21 1356)    Vitals:   08/04/21 1200 08/04/21 1430 08/04/21 1500 08/04/21 1558  BP: 117/88 112/62 106/62   Pulse: 79 72 74   Resp: (!) '24 17 14   '$ Temp:    98 F (36.7 C)  TempSrc:    Oral  SpO2: 99% 95% 100%   Weight:      Height:        Final diagnoses:  Rectus sheath hematoma, initial encounter            Final Clinical Impression(s) / ED Diagnoses Final diagnoses:  Rectus sheath hematoma, initial encounter    Rx / DC Orders ED Discharge Orders     None         Deno Etienne, DO 08/04/21 1611

## 2021-08-04 NOTE — ED Notes (Signed)
Patient verbalizes understanding of discharge instructions. Opportunity for questioning and answers were provided. Armband removed by staff, pt discharged from ED.  

## 2021-08-04 NOTE — Discharge Instructions (Addendum)
Recommend holding Coumadin dose tonight.  Have INR rechecked tomorrow, please discuss ongoing Coumadin regimen with her Coumadin clinic who regularly maintains her INR levels.  Recommend having a hemoglobin level rechecked in 24 to 48 hours.  If she has having increasing abdominal pain, any significant drop in hemoglobin, lightheadedness, shortness of breath or other new concerning symptom, return to ER for reassessment.

## 2021-08-04 NOTE — ED Provider Notes (Signed)
  Physical Exam  BP 102/69   Pulse 91   Temp 98 F (36.7 C) (Oral)   Resp 14   Ht '5\' 2"'$  (1.575 m)   Wt (!) 218.6 kg   SpO2 100%   BMI 88.15 kg/m   Physical Exam  Procedures  Procedures  ED Course / MDM    Medical Decision Making Amount and/or Complexity of Data Reviewed Labs: ordered. Radiology: ordered.  Risk Prescription drug management.   56 y/o lady with history of DVT, factor V Leiden and protein S deficiency, paroxysmal atrial fibrillation on Coumadin, chronic leg pain, morbid obesity here with worsening R abd pain. Found to have enlarged subacute rectus sheath hematoma. PT INR pending. Plan to call surgery once INR results. Plan/dispo pending INR and discussion with surgery. Hbg today is improved from prior.   Her INR is 4.5.  I discussed the case with Stechschulte with General Surgery - he states that while rectus sheath hematomas can be painful they do not represent life threatening bleeding and if pt is otherwise stable, she can be discharged and managed on an out pt basis. Her hbg is improved from prior admission. She appears well. Her abd is soft. I further discussed with Jon ER Pharm D. He advised having pt hold coumadin for at least one more day and have facility monitor closely going forward. Have updated patient. She is agreeable. Additionally advised repeat hgb in 24 to 48 hours. Discussed return precautions with pt.      Lucrezia Starch, MD 08/04/21 [phone]

## 2021-10-01 ENCOUNTER — Telehealth: Payer: Self-pay

## 2021-10-01 NOTE — Telephone Encounter (Signed)
Called and spoke with [place] at [facility]. Confirmed pt is a long term resident at [facility] and INR/Warfarin will be monitored at facility.

## 2021-11-12 ENCOUNTER — Encounter (HOSPITAL_COMMUNITY): Payer: Self-pay | Admitting: *Deleted

## 2021-11-12 ENCOUNTER — Other Ambulatory Visit: Payer: Self-pay

## 2021-11-12 ENCOUNTER — Emergency Department (HOSPITAL_COMMUNITY): Payer: Medicare Other

## 2021-11-12 ENCOUNTER — Emergency Department (HOSPITAL_COMMUNITY)
Admission: EM | Admit: 2021-11-12 | Discharge: 2021-11-12 | Disposition: A | Discharge status: Home / Self Care | Payer: Medicare Other | Attending: Emergency Medicine | Admitting: Emergency Medicine

## 2021-11-12 DIAGNOSIS — G8918 Other acute postprocedural pain: Secondary | ICD-10-CM | POA: Insufficient documentation

## 2021-11-12 DIAGNOSIS — R0789 Other chest pain: Secondary | ICD-10-CM | POA: Insufficient documentation

## 2021-11-12 DIAGNOSIS — J45909 Unspecified asthma, uncomplicated: Secondary | ICD-10-CM | POA: Diagnosis not present

## 2021-11-12 DIAGNOSIS — R079 Chest pain, unspecified: Secondary | ICD-10-CM

## 2021-11-12 DIAGNOSIS — M79605 Pain in left leg: Secondary | ICD-10-CM | POA: Diagnosis present

## 2021-11-12 DIAGNOSIS — Z7901 Long term (current) use of anticoagulants: Secondary | ICD-10-CM | POA: Insufficient documentation

## 2021-11-12 DIAGNOSIS — R200 Anesthesia of skin: Secondary | ICD-10-CM | POA: Insufficient documentation

## 2021-11-12 DIAGNOSIS — R531 Weakness: Secondary | ICD-10-CM | POA: Insufficient documentation

## 2021-11-12 DIAGNOSIS — M79662 Pain in left lower leg: Secondary | ICD-10-CM | POA: Insufficient documentation

## 2021-11-12 DIAGNOSIS — M5136 Other intervertebral disc degeneration, lumbar region: Secondary | ICD-10-CM

## 2021-11-12 LAB — CBC
HCT: 37.3 % (ref 36.0–46.0)
Hemoglobin: 11.3 g/dL — ABNORMAL LOW (ref 12.0–15.0)
MCH: 26.3 pg (ref 26.0–34.0)
MCHC: 30.3 g/dL (ref 30.0–36.0)
MCV: 86.9 fL (ref 80.0–100.0)
Platelets: 267 10*3/uL (ref 150–400)
RBC: 4.29 MIL/uL (ref 3.87–5.11)
RDW: 16.5 % — ABNORMAL HIGH (ref 11.5–15.5)
WBC: 8.7 10*3/uL (ref 4.0–10.5)
nRBC: 0 % (ref 0.0–0.2)

## 2021-11-12 LAB — BASIC METABOLIC PANEL
Anion gap: 11 (ref 5–15)
BUN: 12 mg/dL (ref 6–20)
CO2: 26 mmol/L (ref 22–32)
Calcium: 9 mg/dL (ref 8.9–10.3)
Chloride: 101 mmol/L (ref 98–111)
Creatinine, Ser: 0.49 mg/dL (ref 0.44–1.00)
GFR, Estimated: >60 mL/min (ref >60)
Glucose, Bld: 98 mg/dL (ref 70–99)
Potassium: 3.9 mmol/L (ref 3.5–5.1)
Sodium: 138 mmol/L (ref 135–145)

## 2021-11-12 LAB — TROPONIN I (HIGH SENSITIVITY)
Troponin I (High Sensitivity): 4 ng/L (ref <18)
Troponin I (High Sensitivity): 4 ng/L (ref <18)

## 2021-11-12 LAB — CK: Total CK: 54 U/L (ref 38–234)

## 2021-11-12 LAB — PROTIME-INR
INR: 2 — ABNORMAL HIGH (ref 0.8–1.2)
Prothrombin Time: 22.6 seconds — ABNORMAL HIGH (ref 11.4–15.2)

## 2021-11-12 MED ORDER — OXYCODONE-ACETAMINOPHEN 5-325 MG PO TABS
1.0000 | ORAL_TABLET | Freq: Once | ORAL | Status: AC
  Administered 2021-11-12: 1 via ORAL
  Filled 2021-11-12: qty 1

## 2021-11-12 NOTE — Discharge Instructions (Addendum)
You are seen for chest pain and chronic left leg pain.  Your exam, labs, imaging were all reassuring.  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  This is expected with age.  There were no concerning findings requiring further work-up or admission to the hospital today.  You are safe to follow-up with your primary care doctor.  Come back for any severe worsening pain, skin changes, high fevers, severe chest pain with vomiting or sweating, or any other symptoms concerning to you.

## 2021-11-12 NOTE — ED Provider Notes (Signed)
[facility] EMERGENCY DEPARTMENT Provider Note   CSN: [id number] Arrival date & time: 11/12/21  0444     History  Chief Complaint  Patient presents with   Leg Pain    Beth Marshall is a 56 y.o. female.  Patient as above with significant medical history as below, including warfarin for factor v leiden, dvt, obesity, chronic edema, oa who presents to the ED with complaint of leg pain, leg weakness, back pain, chest pain.  Patient reports has been having left leg pain and weakness, numbness intermittently over the past 4 mos, she is followed orthopedics for this. Feels these symptoms are unchanged from her chronic leg pain/weakness. Denies any recent falls or injuries, she has some ongoing back pain which is also roughly unchanged from her baseline. She is having chest pain over the past 3 days, right sided, sharp/stabbing, worse with palpation. Intermittent, worse with arm / torso movement. No n/v, no dib. No recent medication or diet changes. Reports good compliance with her warfarin, does not feel as though her chronic swelling has changed from her baseline, no dib.  She is not ambulatory at baseline  Patient with chronic left leg pain and numbness following tib-fib fracture back in June.    Past Medical History:  Diagnosis Date   Abnormal coagulation profile 02/18/2015   Arthritis of right knee 12/20/2016   Back pain 02/18/2015   Blood clot in vein    Calculus of gallbladder with cholecystitis and obstruction 06/23/2014   Chronic deep vein thrombosis (DVT) of lower extremity ([place]) 10/27/2015   Dizziness 02/18/2015   Edema of extremities 02/18/2015   Elevated blood pressure reading 03/20/2015   Encounter for screening mammogram for breast cancer 10/27/2015   Factor V Leiden mutation ([place]) 10/27/2015   Gastroesophageal reflux disease 02/18/2015   Generalized anxiety disorder 02/18/2015   High risk medication use 07/25/2015   History of DVT (deep vein thrombosis) 03/15/2018    Hypokalemia 05/16/2015   Impaired fasting glucose 02/18/2015   Insomnia 02/18/2015   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   Lesion of ovary 02/18/2015   Lipoma 02/18/2015   Long term current use of anticoagulant therapy 03/20/2015   Migraine without aura and without status migrainosus, not intractable 06/25/2015   Moderate episode of recurrent major depressive disorder ([place]) 08/02/2018   Obesity, morbid, BMI 50 or higher ([place]) 02/18/2015   OSA (obstructive sleep apnea) 02/18/2015   Osteoarthritis 02/18/2015   Primary osteoarthritis of both knees 08/30/2017   Primary osteoarthritis of left knee 04/22/2015   Protein S deficiency ([place]) 02/18/2015   Shortness of breath 02/18/2015   Tachycardia 07/01/2015   Tachycardia 07/01/2015    Past Surgical History:  Procedure Laterality Date   ABDOMINAL HYSTERECTOMY     TIBIA IM NAIL INSERTION Left 06/19/2021   Procedure: INTRAMEDULLARY (IM) NAIL TIBIAL;  Surgeon: Shona Needles, MD;  Location: [facility];  Service: Orthopedics;  Laterality: Left;     The history is provided by the patient. No language interpreter was used.  Leg Pain Associated symptoms: back pain   Associated symptoms: no fever        Home Medications Prior to Admission medications   Medication Sig Start Date End Date Taking? Authorizing Provider  albuterol (PROVENTIL HFA;VENTOLIN HFA) 108 (90 BASE) MCG/ACT inhaler Inhale 1 puff into the lungs every 6 (six) hours as needed for wheezing or shortness of breath.    [provider]  augmented betamethasone dipropionate (DIPROLENE-AF) 0.05 % cream Apply 1 application. topically 2 (  two) times daily. 07/14/17   [provider]  BELSOMRA 10 MG TABS Take 1 tablet by mouth at bedtime as needed. 06/01/21   [provider]  budesonide-formoterol (SYMBICORT) 160-4.5 MCG/ACT inhaler Inhale 2 puffs into the lungs daily.    [provider]  calcipotriene (DOVONOX) 0.005 % cream Apply 1 application. topically 2 (two) times daily. 08/17/18    [provider]  cetirizine (ZYRTEC) 10 MG tablet Take 1 tablet by mouth daily. 01/16/19   [provider]  cyclobenzaprine (FLEXERIL) 5 MG tablet Take 5 mg by mouth 3 (three) times daily as needed for muscle spasms.    [provider]  diclofenac Sodium (VOLTAREN) 1 % GEL Apply 2-3 g topically as needed for pain. 03/03/21   [provider]  furosemide (LASIX) 40 MG tablet Take 40 mg by mouth daily.    [provider]  montelukast (SINGULAIR) 10 MG tablet Take 1 tablet by mouth daily. 10/24/18   [provider]  oxyCODONE-acetaminophen (PERCOCET/ROXICET) 5-325 MG tablet Take 1-2 tablets by mouth every 4 (four) hours as needed for severe pain (1 tablet moderate pain, 2 tablets severe pain). 06/22/21   Corinne Ports, PA-C  pantoprazole (PROTONIX) 40 MG tablet Take 1 tablet (40 mg total) by mouth daily. 06/27/21 07/27/21  [place] Donnamarie Poag, DO  polyethylene glycol (MIRALAX / GLYCOLAX) 17 g packet Take 17 g by mouth daily as needed for mild constipation. 06/26/21   [place] Donnamarie Poag, DO  potassium chloride (K-DUR) 10 MEQ tablet Take 10 mEq by mouth 2 (two) times daily.    [provider]  SUMAtriptan (IMITREX) 100 MG tablet Take 1 tablet by mouth as needed for migraine. 07/18/15   [provider]  venlafaxine XR (EFFEXOR-XR) 75 MG 24 hr capsule Take 1 capsule by mouth daily. 08/02/18   [provider]  Vitamin D, Ergocalciferol, (DRISDOL) 1.25 MG (50000 UNIT) CAPS capsule Take 1 capsule (50,000 Units total) by mouth every 7 (seven) days. 06/22/21   Corinne Ports, PA-C  warfarin (COUMADIN) 10 MG tablet Take 1 tablet (10 mg total) by mouth daily at 4 PM for 1 day. Give on 6/10 (Received dose in hospital before discharge 6/9) 06/26/21 06/27/21  [place] Donnamarie Poag, DO  warfarin (COUMADIN) 5 MG tablet Take 1 1/2 tablets (7.'5mg'$ ) daily or as directed STARTING 6/11 06/28/21   [place] Donnamarie Poag, DO      Allergies    Cephalexin, Codeine,  Diphenhydramine hcl, and Ranitidine    Review of Systems   Review of Systems  Constitutional:  Negative for activity change and fever.  HENT:  Negative for facial swelling and trouble swallowing.   Eyes:  Negative for discharge and redness.  Respiratory:  Negative for cough and shortness of breath.   Cardiovascular:  Positive for chest pain. Negative for palpitations.  Gastrointestinal:  Negative for abdominal pain and nausea.  Genitourinary:  Negative for dysuria and flank pain.  Musculoskeletal:  Positive for arthralgias and back pain. Negative for gait problem.  Skin:  Negative for pallor and rash.  Neurological:  Positive for weakness. Negative for syncope and headaches.    Physical Exam Updated Vital Signs BP 113/60   Pulse 63   Temp 97.7 F (36.5 C) (Oral)   Resp 18   Ht '5\' 6"'$  (1.676 m)   Wt (!) 218.6 kg   SpO2 95%   BMI 77.78 kg/m  Physical Exam Vitals and nursing note reviewed.  Constitutional:      General:  She is not in acute distress.    Appearance: Normal appearance. She is obese. She is not ill-appearing.  HENT:     Head: Normocephalic and atraumatic.     Right Ear: External ear normal.     Left Ear: External ear normal.     Nose: Nose normal.     Mouth/Throat:     Mouth: Mucous membranes are moist.  Eyes:     General: No scleral icterus.       Right eye: No discharge.        Left eye: No discharge.  Cardiovascular:     Rate and Rhythm: Normal rate and regular rhythm.     Pulses: Normal pulses.          Radial pulses are 2+ on the right side and 2+ on the left side.       Dorsalis pedis pulses are 2+ on the right side and 2+ on the left side.     Heart sounds: Normal heart sounds.  Pulmonary:     Effort: Pulmonary effort is normal. No respiratory distress.     Breath sounds: Normal breath sounds.  Abdominal:     General: Abdomen is flat.     Palpations: Abdomen is soft.     Tenderness: There is no abdominal tenderness. There is no guarding or  rebound.  Musculoskeletal:        General: Normal range of motion.     Cervical back: Normal range of motion.     Right lower leg: No edema.     Left lower leg: No edema.     Comments: LE compartments are soft No obvious external trauma to her LE DP pulses intact b/l   Skin:    General: Skin is warm and dry.     Capillary Refill: Capillary refill takes less than 2 seconds.  Neurological:     Mental Status: She is alert and oriented to person, place, and time.     GCS: GCS eye subscore is 4. GCS verbal subscore is 5. GCS motor subscore is 6.     Comments: She has diminished sensation to her LLE, chronic per pt. Limited ROE to LLE 2/2 discomfort which is also chronic per pt.   Psychiatric:        Mood and Affect: Mood normal.        Behavior: Behavior normal.     ED Results / Procedures / Treatments   Labs (all labs ordered are listed, but only abnormal results are displayed) Labs Reviewed  CBC - Abnormal; Notable for the following components:      Result Value   Hemoglobin 11.3 (*)    RDW 16.5 (*)    All other components within normal limits  PROTIME-INR - Abnormal; Notable for the following components:   Prothrombin Time 22.6 (*)    INR 2.0 (*)    All other components within normal limits  BASIC METABOLIC PANEL  CK  TROPONIN I (HIGH SENSITIVITY)  TROPONIN I (HIGH SENSITIVITY)    EKG EKG Interpretation  Date/Time:  Thursday November 12 2021 05:42:51 EDT Ventricular Rate:  60 PR Interval:  168 QRS Duration: 125 QT Interval:  424 QTC Calculation: 424 R Axis:   2 Text Interpretation: Sinus rhythm Ventricular premature complex Nonspecific intraventricular conduction delay Borderline T abnormalities, anterior leads Interpretation limited secondary to artifact Confirmed by Wynona Dove ([id number]) on 11/12/2021 5:50:55 AM  Radiology DG Chest Port 1 View  Result Date: 11/12/2021 CLINICAL DATA:  Chest pain.  EXAM: PORTABLE CHEST 1 VIEW COMPARISON:  None FINDINGS: Cardiac  enlargement. There is no pleural effusion or edema identified. No airspace opacities identified. Visualized osseous structures appear intact. IMPRESSION: Cardiac enlargement.  No acute cardiopulmonary abnormalities. Electronically Signed   By: Kerby Moors M.D.   On: 11/12/2021 06:35    Procedures Procedures    Medications Ordered in ED Medications - No data to display  ED Course/ Medical Decision Making/ A&P Clinical Course as of 11/12/21 0728  Thu Nov 12, 2021  0714 55 F with Factor V  Leiden on Warfarin with Leg pain/weakness since June with new CP. Follow up CT L spine and XR. Discharge after workup. [VB]    Clinical Course User Index [VB] Elgie Congo, MD                           Medical Decision Making Amount and/or Complexity of Data Reviewed Labs: ordered. Radiology: ordered.   This patient presents to the ED with chief complaint(s) of chronic leg pain and weakness, chest pain with pertinent past medical history of above which further complicates the presenting complaint. The complaint involves an extensive differential diagnosis and also carries with it a high risk of complications and morbidity.     Differential includes all life-threatening causes for chest pain. This includes but is not exclusive to acute coronary syndrome, aortic dissection, pulmonary embolism, cardiac tamponade, community-acquired pneumonia, pericarditis, musculoskeletal chest wall pain, etc.  . Serious etiologies were considered.    Patient with chronic left leg pain and weakness, chronic numbness to her left leg following leg injury in June.  The only symptom that appears to be acute at this time is her right-sided chest wall pain that has been ongoing over the past 3 days per discussion with the patient.  The initial plan is to screening labs/imaging   Additional history obtained: Additional history obtained from  na Records reviewed  prior ed visits, prior surgical visit, prior  labs/imagign Independent labs interpretation:  The following labs were independently interpreted:  Cpk wnl INR 2.0, on warfarin for dvt Bmp/cbc were stable Trop neg initial  Independent visualization of imaging: - I independently visualized the following imaging with scope of interpretation limited to determining acute life threatening conditions related to emergency care: cxr, tib fib r, ct L, which revealed CXR stable, tibfib and CTL pending at shift change  Cardiac monitoring was reviewed and interpreted by myself which shows nsr  Treatment and Reassessment: Symptoms stayed the same  Consultation: - Consulted or discussed management/test interpretation w/ external professional: na  Consideration for admission or further workup: Admission was considered   Pt here today with multiple complaints, she has ongoing pain/numbness/weakness in her LLE which is unchanged since her prior fx from June. Her exam appears simialr to that documented following her surgery in June in the ED from neuro perspective. Pain is relatively at a similar level to how it has been since her surgery. She began having some chest pain over the last 3 days that does appear to be more likely MSK/atypical in nature. She has no dib. Pt is pending remainder of her workup in the ED, if this is negative would favor discharge back to nursing facility, pt signed out to incoming EDP at shift change.    Social Determinants of health: Social History   Tobacco Use   Smoking status: Never  Substance Use Topics   Alcohol use: No   Drug use:  No    Frequency: 3.0 times per week            Final Clinical Impression(s) / ED Diagnoses Final diagnoses:  Chest pain, unspecified type  Pain of left lower extremity  Post-operative pain    Rx / DC Orders ED Discharge Orders     None         Jeanell Sparrow, DO 11/12/21 5868

## 2021-11-12 NOTE — ED Provider Notes (Signed)
  Physical Exam  BP (!) 111/43   Pulse 64   Temp 97.7 F (36.5 C) (Oral)   Resp (!) 22   Ht '5\' 6"'$  (1.676 m)   Wt (!) 218.6 kg   SpO2 97%   BMI 77.78 kg/m   Physical Exam  Procedures  Procedures  ED Course / MDM   Clinical Course as of 11/12/21 0944  Thu Nov 12, 2021  0714 55 F with Factor V  Leiden on Warfarin, morbid obesity, bedbound status, old left tib-fib fx with chronic left Leg pain/weakness since June with new CP. Follow up CT L spine and XR. Discharge after workup. [VB]  [id number] I reassessed patient who is resting comfortably in bed.  She has no active chest pain and she says she has chronic leg pain and requests her chronic home med of Percocet for pain.  Reviewed her CT L-spine which just shows chronic degenerative changes as well as no acute changes in left tibia/fibula.  Her EKG has nonspecific T wave inversions similar to previous EKG with flat troponins before.  No acute electrolyte abnormalities.  INR is therapeutic at 2.  There are no signs of ischemic limb, no changes suggestive of cellulitis or new DVT.  She is safe for discharge with follow-up with outpatient PCP. [VB]    Clinical Course User Index [VB] Elgie Congo, MD   Medical Decision Making Amount and/or Complexity of Data Reviewed Labs: ordered. Radiology: ordered.  Risk Prescription drug management.         Elgie Congo, MD 11/12/21 [phone]

## 2021-11-12 NOTE — ED Triage Notes (Signed)
Patient presents to the ED via [facility] from [facility] c/o left leg pain onset earlier today states she was able to go the sleep and the pain woke her at 1am  states it felt like someone was hitting her leg with bricks.

## 2021-11-12 NOTE — ED Notes (Signed)
Unable to get IV at this time - MD notified - ok for no IV at this time

## 2021-11-13 ENCOUNTER — Encounter (HOSPITAL_COMMUNITY): Payer: Self-pay

## 2021-11-13 ENCOUNTER — Emergency Department (HOSPITAL_COMMUNITY)
Admission: EM | Admit: 2021-11-13 | Discharge: 2022-02-05 | Disposition: A | Discharge status: Home / Self Care | Payer: Medicare Other | Attending: Emergency Medicine | Admitting: Emergency Medicine

## 2021-11-13 ENCOUNTER — Other Ambulatory Visit: Payer: Self-pay

## 2021-11-13 DIAGNOSIS — D6851 Activated protein C resistance: Secondary | ICD-10-CM | POA: Diagnosis present

## 2021-11-13 DIAGNOSIS — M549 Dorsalgia, unspecified: Secondary | ICD-10-CM | POA: Diagnosis present

## 2021-11-13 DIAGNOSIS — Z23 Encounter for immunization: Secondary | ICD-10-CM | POA: Insufficient documentation

## 2021-11-13 DIAGNOSIS — I4891 Unspecified atrial fibrillation: Secondary | ICD-10-CM | POA: Diagnosis present

## 2021-11-13 DIAGNOSIS — Z7901 Long term (current) use of anticoagulants: Secondary | ICD-10-CM | POA: Insufficient documentation

## 2021-11-13 DIAGNOSIS — G4733 Obstructive sleep apnea (adult) (pediatric): Secondary | ICD-10-CM | POA: Diagnosis not present

## 2021-11-13 DIAGNOSIS — G8929 Other chronic pain: Secondary | ICD-10-CM | POA: Diagnosis not present

## 2021-11-13 DIAGNOSIS — M545 Low back pain, unspecified: Secondary | ICD-10-CM | POA: Insufficient documentation

## 2021-11-13 DIAGNOSIS — Z1152 Encounter for screening for COVID-19: Secondary | ICD-10-CM | POA: Diagnosis not present

## 2021-11-13 DIAGNOSIS — R0602 Shortness of breath: Secondary | ICD-10-CM | POA: Diagnosis not present

## 2021-11-13 DIAGNOSIS — M79604 Pain in right leg: Secondary | ICD-10-CM | POA: Insufficient documentation

## 2021-11-13 DIAGNOSIS — R590 Localized enlarged lymph nodes: Secondary | ICD-10-CM

## 2021-11-13 DIAGNOSIS — M79605 Pain in left leg: Secondary | ICD-10-CM | POA: Diagnosis not present

## 2021-11-13 DIAGNOSIS — R600 Localized edema: Secondary | ICD-10-CM | POA: Diagnosis not present

## 2021-11-13 DIAGNOSIS — Z86718 Personal history of other venous thrombosis and embolism: Secondary | ICD-10-CM

## 2021-11-13 LAB — PROTIME-INR
INR: 2.1 — ABNORMAL HIGH (ref 0.8–1.2)
Prothrombin Time: 23.8 seconds — ABNORMAL HIGH (ref 11.4–15.2)

## 2021-11-13 MED ORDER — WARFARIN SODIUM 7.5 MG PO TABS
8.5000 mg | ORAL_TABLET | Freq: Once | ORAL | Status: AC
  Administered 2021-11-13: 8.5 mg via ORAL
  Filled 2021-11-13: qty 1

## 2021-11-13 MED ORDER — WARFARIN - PHARMACIST DOSING INPATIENT
Freq: Every day | Status: DC
  Administered 2021-11-30 – 2022-02-04 (×4): 1

## 2021-11-13 NOTE — ED Triage Notes (Signed)
Pt BIB [facility] from [facility] facility d/t leaving AMA with c/o back & bil leg pain & not having a good experience with staff. Pt informs [facility] she is to be brought o [facility] from here after d/c (per pt). A/Ox4, VSS, obese, no ambulation.

## 2021-11-13 NOTE — ED Provider Notes (Addendum)
[facility] EMERGENCY DEPARTMENT Provider Note   CSN: [id number] Arrival date & time: 11/13/21  0940     History  No chief complaint on file.   Beth Marshall is a 56 y.o. female.  56 year old female presents to the ED via [facility] from [facility] with a chief complaint of back pain, bilateral leg pain.  Patient reports she has been in rehabilitation since the month of June after being diagnosed with chronic back pain s/p LEFT tib fib fracture. She was evaluated in the ED for the same without any change in symptoms. Patient is bed bound and has not walked since June. She 5 deficiency, currently on warfarin and reports compliance with this medication.  She also has a prior history of DVTs, reports no worsening swelling to her lower extremities. Patient reports she cannot go back to the habilitation center, is requesting disposition from here to go for health.  There has not been any acute changes in her chronic back pain.  No fevers, no urinary symptoms, no other complaints.  The history is provided by the patient and medical records.  Does have a history of factor     Home Medications Prior to Admission medications   Medication Sig Start Date End Date Taking? Authorizing Provider  albuterol (PROVENTIL HFA;VENTOLIN HFA) 108 (90 BASE) MCG/ACT inhaler Inhale 1 puff into the lungs every 6 (six) hours as needed for wheezing or shortness of breath.    [provider]  augmented betamethasone dipropionate (DIPROLENE-AF) 0.05 % cream Apply 1 application. topically 2 (two) times daily. 07/14/17   [provider]  BELSOMRA 10 MG TABS Take 1 tablet by mouth at bedtime as needed. 06/01/21   [provider]  budesonide-formoterol (SYMBICORT) 160-4.5 MCG/ACT inhaler Inhale 2 puffs into the lungs daily.    [provider]  calcipotriene (DOVONOX) 0.005 % cream Apply 1 application. topically 2 (two) times daily. 08/17/18   [provider]   cetirizine (ZYRTEC) 10 MG tablet Take 1 tablet by mouth daily. 01/16/19   [provider]  cyclobenzaprine (FLEXERIL) 5 MG tablet Take 5 mg by mouth 3 (three) times daily as needed for muscle spasms.    [provider]  diclofenac Sodium (VOLTAREN) 1 % GEL Apply 2-3 g topically as needed for pain. 03/03/21   [provider]  furosemide (LASIX) 40 MG tablet Take 40 mg by mouth daily.    [provider]  montelukast (SINGULAIR) 10 MG tablet Take 1 tablet by mouth daily. 10/24/18   [provider]  oxyCODONE-acetaminophen (PERCOCET/ROXICET) 5-325 MG tablet Take 1-2 tablets by mouth every 4 (four) hours as needed for severe pain (1 tablet moderate pain, 2 tablets severe pain). 06/22/21   Corinne Ports, PA-C  pantoprazole (PROTONIX) 40 MG tablet Take 1 tablet (40 mg total) by mouth daily. 06/27/21 07/27/21  [place] Donnamarie Poag, DO  polyethylene glycol (MIRALAX / GLYCOLAX) 17 g packet Take 17 g by mouth daily as needed for mild constipation. 06/26/21   [place] Donnamarie Poag, DO  potassium chloride (K-DUR) 10 MEQ tablet Take 10 mEq by mouth 2 (two) times daily.    [provider]  SUMAtriptan (IMITREX) 100 MG tablet Take 1 tablet by mouth as needed for migraine. 07/18/15   [provider]  venlafaxine XR (EFFEXOR-XR) 75 MG 24 hr capsule Take 1 capsule by mouth daily. 08/02/18   [provider]  Vitamin D, Ergocalciferol, (DRISDOL) 1.25 MG (50000 UNIT) CAPS capsule Take 1 capsule (50,000  Units total) by mouth every 7 (seven) days. 06/22/21   Corinne Ports, PA-C  warfarin (COUMADIN) 10 MG tablet Take 1 tablet (10 mg total) by mouth daily at 4 PM for 1 day. Give on 6/10 (Received dose in hospital before discharge 6/9) 06/26/21 06/27/21  [place] Donnamarie Poag, DO  warfarin (COUMADIN) 5 MG tablet Take 1 1/2 tablets (7.'5mg'$ ) daily or as directed STARTING 6/11 06/28/21   [place] Donnamarie Poag, DO      Allergies    Cephalexin, Codeine, Diphenhydramine hcl, and Ranitidine     Review of Systems   Review of Systems  Constitutional:  Negative for chills and fever.  HENT:  Negative for sore throat.   Respiratory:  Negative for shortness of breath.   Cardiovascular:  Negative for chest pain.  Gastrointestinal:  Negative for abdominal pain.  Musculoskeletal:  Positive for back pain.  All other systems reviewed and are negative.   Physical Exam Updated Vital Signs There were no vitals taken for this visit. Physical Exam Vitals and nursing note reviewed.  Constitutional:      Appearance: Normal appearance. She is obese.  HENT:     Head: Normocephalic and atraumatic.     Mouth/Throat:     Mouth: Mucous membranes are moist.  Cardiovascular:     Rate and Rhythm: Normal rate.     Pulses:          Dorsalis pedis pulses are 2+ on the right side and 2+ on the left side.       Posterior tibial pulses are 2+ on the right side and 2+ on the left side.     Comments: Bilateral pitting edema 1+, no calf tenderness bilaterally.  DP, PT pulses 2+ symmetric. Pulmonary:     Effort: Pulmonary effort is normal.     Breath sounds: No wheezing.  Abdominal:     General: Abdomen is flat.     Tenderness: There is no abdominal tenderness.  Musculoskeletal:     Cervical back: Normal range of motion and neck supple.     Right lower leg: 1+ Edema present.     Left lower leg: 1+ Edema present.  Skin:    General: Skin is warm.     Comments: No bilateral leg erythema or streaking in the skin to suggest infection.  Neurological:     Mental Status: She is alert and oriented to person, place, and time.     Comments: Able to minimally lift bilateral legs off the bed.  Strength is diminished but patient reports this is her baseline.      ED Results / Procedures / Treatments   Labs (all labs ordered are listed, but only abnormal results are displayed) Labs Reviewed - No data to display  EKG None  Radiology CT Lumbar Spine Wo Contrast  Result Date: 11/12/2021 CLINICAL  DATA:  Low back pain, symptoms persist with > 6 wks treatment EXAM: CT LUMBAR SPINE WITHOUT CONTRAST TECHNIQUE: Multidetector CT imaging of the lumbar spine was performed without intravenous contrast administration. Multiplanar CT image reconstructions were also generated. RADIATION DOSE REDUCTION: This exam was performed according to the departmental dose-optimization program which includes automated exposure control, adjustment of the mA and/or kV according to patient size and/or use of iterative reconstruction technique. COMPARISON:  CT 08/04/2021, CT 06/23/2014 FINDINGS: Segmentation: 5 lumbar type vertebrae. Alignment: Trace retrolisthesis at L4-L5 and L5-S1. Vertebrae: There is no acute lumbar spine fracture. There is a hemangioma in the L4 vertebral body. Mild physiologic posterior wedging  of L5. Paraspinal and other soft tissues: Nonobstructive right upper pole renal stone measuring up to 4 mm. Unchanged right adrenal mass which measures up to 2.4 x 2.2 cm (series 3, image 4). Disc levels: There is mild to moderate multilevel degenerative disc disease, worst at L5-S1 with significant disc height loss and degenerative endplate change. There is poor image quality with limitation in ability to accurately assess degree of spinal canal and neural foraminal stenosis, however there is at least moderate right and severe left neural foraminal stenosis at L5-S1 related to disc bulging and bony spurring. There is probable moderate bilateral neural foraminal stenosis at L4-L5. IMPRESSION: No acute lumbar spine fracture. Multilevel degenerative disc disease and facet arthropathy resulting in significant bilateral neural foraminal stenosis at L4-L5 and L5-S1, left worse than right, as discussed above. 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 Electronically Signed   By: Maurine Simmering M.D.   On: 11/12/2021  08:16   DG Tibia/Fibula Left  Result Date: 11/12/2021 CLINICAL DATA:  pain, prior fx EXAM: LEFT TIBIA AND FIBULA - 2 VIEW COMPARISON:  Multiple prior radiographs. FINDINGS: Postsurgical changes of intramedullary nailing for a proximal tibial fracture. Mild interval healing. Unchanged fracture alignment. Progressive healing of the proximal fibular fracture in unchanged alignment. No new fracture. There is lower extremity soft tissue swelling. There is a calcification in the anterolateral soft tissues. Hardware is intact without evidence of loosening. There is tibiotalar and midfoot osteoarthritis. Diffuse osteopenia. Vascular calcifications. IMPRESSION: Healing proximal tibia fracture in unchanged alignment status post ORIF. No evidence of hardware complication. Healing proximal fibular fracture in unchanged alignment. Left lower extremity soft tissue swelling. Electronically Signed   By: Maurine Simmering M.D.   On: 11/12/2021 08:06   DG Chest Port 1 View  Result Date: 11/12/2021 CLINICAL DATA:  Chest pain. EXAM: PORTABLE CHEST 1 VIEW COMPARISON:  None FINDINGS: Cardiac enlargement. There is no pleural effusion or edema identified. No airspace opacities identified. Visualized osseous structures appear intact. IMPRESSION: Cardiac enlargement.  No acute cardiopulmonary abnormalities. Electronically Signed   By: Kerby Moors M.D.   On: 11/12/2021 06:35    Procedures Procedures    Medications Ordered in ED Medications - No data to display  ED Course/ Medical Decision Making/ A&P                           Medical Decision Making   This patient presents to the ED for concern of back pain, this involves a number of treatment options, and is a complaint that carries with it a high risk of complications and morbidity.  The differential diagnosis includes discitis, epidural abscess versus acute on chronic back pain.    Co morbidities: Discussed in HPI   Brief History:  Patient here with  underlying chronic  EMR reviewed including pt PMHx, past surgical history and past visits to ER.   See HPI for more details    Reevaluation:  After the interventions noted above I re-evaluated patient and found that they have :stayed the same   Social Determinants of Health:  The patient's social determinants of health were a factor in the care of this patient   Patient is currently at [facility] rehabilitation center, reports that she does not want to return there and would like to go to [facility] instead.  According to patient her  mother and friend are currently finalizing the steps of getting her admitted to a new facility.   Problem List / ED Course:  Patient presents to the ED via [place] for acute on chronic back pain that has been ongoing since June.  Previously admitted to [facility] of [place], she did have a full work-up yesterday including cardiac workup. No chest pain on todays visit.  Her CT lumbar yesterday showed no acute lumbar spine fracture.  There is some bilateral neuroforaminal stenosis around L4-L5.  He did not have any further falls since her CT yesterday.  Her exam is benign, she is in no distress while in the emergency department, did take her hydrocodone 2 hours prior to my evaluation therefore the not extra medicate patient at this time.  She is requesting to be transferred over to go for health for new level of care.  However, facility reports that patient is not accepted at facility.  Therefore, patient will need to return back to [place].  She has been hemodynamically stable while in the ED with no further testing needed. Low suspicion for acute intervention, patient stable for discharge.  Attempted to discharge patient back to facility of [place] but they refused to accept her as she left AMA. She tried to be admitted at [facility] but they will not be accepting her. Spoke to social work who will work on patient getting a new SNF  placement. Physical therapy evaluation has been ordered.    Dispostion:  After consideration of the diagnostic results and the patients response to treatment, I feel that the patent would benefit from outpatient rehabilitation      Portions of this note were generated with Dragon dictation software. Dictation errors may occur despite best attempts at proofreading.   Final Clinical Impression(s) / ED Diagnoses Final diagnoses:  Chronic bilateral low back pain without sciatica    Rx / DC Orders ED Discharge Orders     None         Janeece Fitting, PA-C 11/13/21 1247    Janeece Fitting, PA-C 11/13/21 1424    Dina Rich, Barbette Hair, MD 11/13/21 1442

## 2021-11-13 NOTE — ED Notes (Signed)
Patient moved to Bariatric bed for comfort and better care-Monique,RN

## 2021-11-13 NOTE — Progress Notes (Signed)
ANTICOAGULATION CONSULT NOTE - Initial Consult  Pharmacy Consult for Warfarin Indication:  paroxysmal A-fib and recurrent DVT history of factor V Leiden mutation and protein S deficiency  Allergies  Allergen Reactions   Cephalexin Other (See Comments)    unknown unknown    Codeine Nausea And Vomiting   Diphenhydramine Hcl Itching   Ranitidine Rash    Patient Measurements:   Vital Signs: Temp: 98.8 F (37.1 C) (10/27 1353) Temp Source: Oral (10/27 1353) BP: 131/55 (10/27 1353) Pulse Rate: 69 (10/27 1353)  Labs: Recent Labs    11/12/21 0609 11/12/21 0801  HGB 11.3*  --   HCT 37.3  --   PLT 267  --   LABPROT 22.6*  --   INR 2.0*  --   CREATININE 0.49  --   CKTOTAL 54  --   TROPONINIHS 4 4    Estimated Creatinine Clearance: 157.9 mL/min (by C-G formula based on SCr of 0.49 mg/dL).   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 ([place]) 10/27/2015   Dizziness 02/18/2015   Edema of extremities 02/18/2015   Elevated blood pressure reading 03/20/2015   Encounter for screening mammogram for breast cancer 10/27/2015   Factor V Leiden mutation ([place]) 10/27/2015   Gastroesophageal reflux disease 02/18/2015   Generalized anxiety disorder 02/18/2015   High risk medication use 07/25/2015   History of DVT (deep vein thrombosis) 03/15/2018   Hypokalemia 05/16/2015   Impaired fasting glucose 02/18/2015   Insomnia 02/18/2015   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   Lesion of ovary 02/18/2015   Lipoma 02/18/2015   Long term current use of anticoagulant therapy 03/20/2015   Migraine without aura and without status migrainosus, not intractable 06/25/2015   Moderate episode of recurrent major depressive disorder ([place]) 08/02/2018   Obesity, morbid, BMI  50 or higher ([place]) 02/18/2015   OSA (obstructive sleep apnea) 02/18/2015   Osteoarthritis 02/18/2015   Primary osteoarthritis of both knees 08/30/2017   Primary osteoarthritis of left knee 04/22/2015   Protein S deficiency ([place]) 02/18/2015   Shortness of breath 02/18/2015   Tachycardia 07/01/2015   Tachycardia 07/01/2015    Medications:  (Not in a hospital admission)  Scheduled:  Infusions:  PRN:   Assessment: 54 yof with a history of factor V leiden on wawrfarin, morbid obesity, bedbound status, recent left tib/fib. Patient is presenting with back pain, bilateral leg pain. Warfarin per pharmacy consult placed for  paroxysmal A-fib and recurrent DVT history of factor V Leiden mutation and protein S deficiency .  Patient taking warfarin prior to arrival. Home dose is largely unknown at this time as patient's most recent facility has not provided MAR at this time. Last taken 10/26 per patient. Patient states took 8.5 mg yesterday and that dose does not fall to any particular pattern.  PT / INR today is 23.8 / 2.1, which is therapeutic Hgb 11.3; plt 267  Goal of Therapy:  INR Goal 2-3 Monitor platelets by anticoagulation protocol: Yes   Plan:  Warfarin 8.5 mg once tonight Monitor for s/s of hemorrhage, daily INR, CBC Watch for new DDIs  Lorelei Pont, PharmD, BCPS 11/13/2021 5:52 PM ED Clinical Pharmacist -  [phone]

## 2021-11-13 NOTE — Progress Notes (Addendum)
CSW spoke with Kia at [facility] who states the patient's mother did tour the facility yesterday but that no bed offer was made and that one will not be made in the future.  CSW spoke with Altha Harm at [facility] who states the patient did leave AMA from the facility yesterday. Altha Harm confirms patient is not eligible to return to any of the twelve facilities she manages.  CSW spoke with patient at bedside and with patient's mother Inez Catalina via phone. Patient left AMA yesterday from [facility] after being a resident there since June. Patient reports she was not getting as much therapy as she desired due to staffing limitations. Patient's mother reported she toured [facility] and was offered a bed - however per conversation with admissions at [facility] prior to this encounter, no formal bed offer was made for the patient. CSW explained that leaving AMA from a facility would likely be problematic in securing new placement. Patient reports she wants to regain her sense of independence and return home to live with her parents. Patient reports she cannot go home with her parents as they are unable to care for her. Patient reports she is unable to care for herself independently and requires significant assistance.   CSW spoke with ED Director, PA, and MD about patient - patient will likely have to board until new placement can be secured. New orders were placed for PT evaluation for insurance authorization purposes.  Madilyn Fireman, MSW, LCSW Transitions of Care  Clinical Social Worker II [phone]

## 2021-11-13 NOTE — Progress Notes (Cosign Needed)
Patient will require less than 30 days in a [place].  Madilyn Fireman, MSW, LCSW Transitions of Care  Clinical Social Worker II [phone]

## 2021-11-13 NOTE — ED Notes (Signed)
This RN called [facility] to give report so pt can be transported & they informed this RN that her bed was NOT accepted, EDP & Charge RN informed.

## 2021-11-13 NOTE — Progress Notes (Signed)
PT Cancellation Note  Patient Details Name: PRESLEI BLAKLEY MRN: [id number] DOB: 22-Nov-1965   Cancelled Treatment:    Reason Eval/Treat Not Completed: Other (comment).  Order for PT to see pt but the LLE WB orders are not on chart, with concern that the fracture is persisting.  Pt thinks she is permitted to [facility], will await guidance for mobility.   Ramond Dial 11/13/2021, 3:36 PM  Mee Hives, PT PhD Acute Rehab Dept. Number: [place] and [place]

## 2021-11-13 NOTE — NC FL2 (Signed)
[place] LEVEL OF CARE SCREENING TOOL     IDENTIFICATION  Patient Name: Beth Marshall Birthdate: [birth year] Sex: female Admission Date (Current Location): 11/13/2021  [place] and [place] Number:  [place]val Linsey [phone] [place] and Address:  [place]. [facility] [place] [address], [place], [place] [address]      Provider Number: [id number]  Attending Physician Name and Address:  Default, Provider, MD  Relative Name and Phone Number:       Current Level of Care: Hospital Recommended Level of Care: [place] Prior Approval Number:    Date Approved/Denied:   PASRR Number:    Discharge Plan: Home    Current Diagnoses: Patient Active Problem List   Diagnosis Date Noted   Rectus sheath hematoma, initial encounter 06/23/2021   Acute postoperative anemia due to expected blood loss 06/22/2021   Vitamin D deficiency 06/22/2021   Right foot sprain, initial encounter 06/21/2021   Ankle fracture 06/19/2021   Tibia/fibula fracture, left, closed, initial encounter 06/19/2021   Fracture of lower extremity 06/19/2021   Atrial fibrillation ([place]) 05/06/2021   Moderate episode of recurrent major depressive disorder ([place]) 08/02/2018   History of DVT (deep vein thrombosis) 03/15/2018   Primary osteoarthritis of both knees 08/30/2017   Arthritis of right knee 12/20/2016   Chronic deep vein thrombosis (DVT) of lower extremity ([place]) 10/27/2015   Encounter for screening mammogram for breast cancer 10/27/2015   Factor V Leiden mutation ([place]) 10/27/2015   High risk medication use 07/25/2015   Migraine without aura and without status migrainosus, not intractable 06/25/2015   Hypokalemia 05/16/2015   Primary osteoarthritis of left knee 04/22/2015   Elevated blood pressure reading 03/20/2015   Long term (current) use of anticoagulants 03/20/2015   Edema of extremities 02/18/2015   Dizziness 02/18/2015   Back pain 02/18/2015   Abnormal coagulation  profile 02/18/2015   Generalized anxiety disorder 02/18/2015   Gastroesophageal reflux disease 02/18/2015   Lesion of ovary 02/18/2015   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   Insomnia 02/18/2015   Impaired fasting glucose 02/18/2015   OSA (obstructive sleep apnea) 02/18/2015   Obesity, morbid, BMI 50 or higher ([place]) 02/18/2015   Lipoma 02/18/2015   Shortness of breath 02/18/2015   Protein S deficiency ([place]) 02/18/2015   Osteoarthritis 02/18/2015   Calculus of gallbladder with cholecystitis and obstruction 06/23/2014    Orientation RESPIRATION BLADDER Height & Weight     Self, Time, Situation, Place  Normal Incontinent Weight:   Height:     BEHAVIORAL SYMPTOMS/MOOD NEUROLOGICAL BOWEL NUTRITION STATUS      Incontinent Diet (Heart Healthy)  AMBULATORY STATUS COMMUNICATION OF NEEDS Skin   Total Care Verbally                         Personal Care Assistance Level of Assistance  Bathing, Feeding, Dressing, Total care Bathing Assistance: Maximum assistance Feeding assistance: Maximum assistance Dressing Assistance: Maximum assistance Total Care Assistance: Maximum assistance   Functional Limitations Info  Sight, Hearing, Speech Sight Info: Adequate (Glasses) Hearing Info: Adequate Speech Info: Adequate    SPECIAL CARE FACTORS FREQUENCY  PT (By licensed PT), OT (By licensed OT)     PT Frequency: 5x weekly OT Frequency: 5x weekly            Contractures Contractures Info: Not present    Additional Factors Info  Code Status, Allergies Code Status Info: Full Code Allergies Info: Cephalexin   Codeine   Diphenhydramine Hcl  Ranitidine           Current Medications (11/13/2021):  This is the current hospital active medication list No current facility-administered medications for this encounter.   Current Outpatient Medications  Medication Sig Dispense Refill   albuterol (PROVENTIL HFA;VENTOLIN HFA) 108 (90 BASE) MCG/ACT inhaler Inhale 1 puff into the lungs every 6  (six) hours as needed for wheezing or shortness of breath.     augmented betamethasone dipropionate (DIPROLENE-AF) 0.05 % cream Apply 1 application. topically 2 (two) times daily.     BELSOMRA 10 MG TABS Take 1 tablet by mouth at bedtime as needed.     budesonide-formoterol (SYMBICORT) 160-4.5 MCG/ACT inhaler Inhale 2 puffs into the lungs daily.     calcipotriene (DOVONOX) 0.005 % cream Apply 1 application. topically 2 (two) times daily.     cetirizine (ZYRTEC) 10 MG tablet Take 1 tablet by mouth daily.     cyclobenzaprine (FLEXERIL) 5 MG tablet Take 5 mg by mouth 3 (three) times daily as needed for muscle spasms.     diclofenac Sodium (VOLTAREN) 1 % GEL Apply 2-3 g topically as needed for pain.     furosemide (LASIX) 40 MG tablet Take 40 mg by mouth daily.     montelukast (SINGULAIR) 10 MG tablet Take 1 tablet by mouth daily.     oxyCODONE-acetaminophen (PERCOCET/ROXICET) 5-325 MG tablet Take 1-2 tablets by mouth every 4 (four) hours as needed for severe pain (1 tablet moderate pain, 2 tablets severe pain). 60 tablet 0   pantoprazole (PROTONIX) 40 MG tablet Take 1 tablet (40 mg total) by mouth daily. 30 tablet 0   polyethylene glycol (MIRALAX / GLYCOLAX) 17 g packet Take 17 g by mouth daily as needed for mild constipation. 14 each 0   potassium chloride (K-DUR) 10 MEQ tablet Take 10 mEq by mouth 2 (two) times daily.     SUMAtriptan (IMITREX) 100 MG tablet Take 1 tablet by mouth as needed for migraine.     venlafaxine XR (EFFEXOR-XR) 75 MG 24 hr capsule Take 1 capsule by mouth daily.     Vitamin D, Ergocalciferol, (DRISDOL) 1.25 MG (50000 UNIT) CAPS capsule Take 1 capsule (50,000 Units total) by mouth every 7 (seven) days. 5 capsule 0   warfarin (COUMADIN) 10 MG tablet Take 1 tablet (10 mg total) by mouth daily at 4 PM for 1 day. Give on 6/10 (Received dose in hospital before discharge 6/9) 1 tablet 0   warfarin (COUMADIN) 5 MG tablet Take 1 1/2 tablets (7.'5mg'$ ) daily or as directed STARTING 6/11 50  tablet 0     Discharge Medications: Please see discharge summary for a list of discharge medications.  Relevant Imaging Results:  Relevant Lab Results:   Additional Information SSN: [id number]  Archie Endo, LCSW

## 2021-11-13 NOTE — Discharge Instructions (Addendum)
You may continue treatment for your ongoing back pain.  Follow-up with your orthopedist for further treatment.

## 2021-11-13 NOTE — Progress Notes (Signed)
PT order for Imminent DC received.  Reviewed medical record and spoke vis SecureChat with SW.  Pt unlikely to DC anytime soon. Bedbound since May.  skilled PT in the ED unlikely to produce any functional gains if she has been working with PT at [facility] since May.  We will f/u tomorrow and attempt PT evaluation to fully assess her rehab needs. [place]  Office [phone] 11/13/2021

## 2021-11-14 DIAGNOSIS — M545 Low back pain, unspecified: Secondary | ICD-10-CM | POA: Diagnosis not present

## 2021-11-14 LAB — CBC WITH DIFFERENTIAL/PLATELET
Abs Immature Granulocytes: 0.02 10*3/uL (ref 0.00–0.07)
Basophils Absolute: 0 10*3/uL (ref 0.0–0.1)
Basophils Relative: 1 %
Eosinophils Absolute: 0.2 10*3/uL (ref 0.0–0.5)
Eosinophils Relative: 2 %
HCT: 32.7 % — ABNORMAL LOW (ref 36.0–46.0)
Hemoglobin: 10.3 g/dL — ABNORMAL LOW (ref 12.0–15.0)
Immature Granulocytes: 0 %
Lymphocytes Relative: 25 %
Lymphs Abs: 2.1 10*3/uL (ref 0.7–4.0)
MCH: 27 pg (ref 26.0–34.0)
MCHC: 31.5 g/dL (ref 30.0–36.0)
MCV: 85.8 fL (ref 80.0–100.0)
Monocytes Absolute: 0.8 10*3/uL (ref 0.1–1.0)
Monocytes Relative: 10 %
Neutro Abs: 5 10*3/uL (ref 1.7–7.7)
Neutrophils Relative %: 62 %
Platelets: 222 10*3/uL (ref 150–400)
RBC: 3.81 MIL/uL — ABNORMAL LOW (ref 3.87–5.11)
RDW: 16.5 % — ABNORMAL HIGH (ref 11.5–15.5)
WBC: 8.1 10*3/uL (ref 4.0–10.5)
nRBC: 0 % (ref 0.0–0.2)

## 2021-11-14 LAB — PROTIME-INR
INR: 2.4 — ABNORMAL HIGH (ref 0.8–1.2)
Prothrombin Time: 25.7 seconds — ABNORMAL HIGH (ref 11.4–15.2)

## 2021-11-14 MED ORDER — CYCLOBENZAPRINE HCL 10 MG PO TABS
5.0000 mg | ORAL_TABLET | Freq: Three times a day (TID) | ORAL | Status: DC | PRN
  Administered 2021-11-25 – 2022-01-31 (×17): 5 mg via ORAL
  Filled 2021-11-14 (×19): qty 1

## 2021-11-14 MED ORDER — VITAMIN D (ERGOCALCIFEROL) 1.25 MG (50000 UNIT) PO CAPS
50000.0000 [IU] | ORAL_CAPSULE | ORAL | Status: DC
  Administered 2021-11-14 – 2022-01-30 (×8): 50000 [IU] via ORAL
  Filled 2021-11-14 (×15): qty 1

## 2021-11-14 MED ORDER — FUROSEMIDE 20 MG PO TABS
40.0000 mg | ORAL_TABLET | Freq: Every day | ORAL | Status: DC
  Administered 2021-11-14 – 2022-02-05 (×84): 40 mg via ORAL
  Filled 2021-11-14 (×84): qty 2

## 2021-11-14 MED ORDER — MONTELUKAST SODIUM 10 MG PO TABS
10.0000 mg | ORAL_TABLET | Freq: Every day | ORAL | Status: DC
  Administered 2021-11-14 – 2021-11-22 (×9): 10 mg via ORAL
  Filled 2021-11-14 (×10): qty 1

## 2021-11-14 MED ORDER — MELATONIN 3 MG PO TABS
3.0000 mg | ORAL_TABLET | Freq: Every day | ORAL | Status: DC | PRN
  Administered 2021-11-18 – 2022-02-04 (×67): 3 mg via ORAL
  Filled 2021-11-14 (×69): qty 1

## 2021-11-14 MED ORDER — ACETAMINOPHEN 325 MG PO TABS
650.0000 mg | ORAL_TABLET | Freq: Four times a day (QID) | ORAL | Status: DC | PRN
  Administered 2021-11-19 – 2022-02-03 (×6): 650 mg via ORAL
  Filled 2021-11-14 (×7): qty 2

## 2021-11-14 MED ORDER — SUMATRIPTAN SUCCINATE 100 MG PO TABS: 100.0000 mg | ORAL_TABLET | ORAL | Status: DC | PRN

## 2021-11-14 MED ORDER — DICLOFENAC SODIUM 1 % EX GEL
2.0000 g | Freq: Three times a day (TID) | CUTANEOUS | Status: DC | PRN
  Administered 2022-01-06 – 2022-01-07 (×3): 2 g via TOPICAL
  Filled 2021-11-14: qty 100

## 2021-11-14 MED ORDER — WARFARIN SODIUM 4 MG PO TABS: 8.0000 mg | ORAL_TABLET | ORAL | Status: DC

## 2021-11-14 MED ORDER — PANTOPRAZOLE SODIUM 40 MG PO TBEC
40.0000 mg | DELAYED_RELEASE_TABLET | Freq: Every day | ORAL | Status: DC
  Administered 2021-11-14 – 2022-02-05 (×84): 40 mg via ORAL
  Filled 2021-11-14 (×84): qty 1

## 2021-11-14 MED ORDER — OXYCODONE-ACETAMINOPHEN 5-325 MG PO TABS
1.0000 | ORAL_TABLET | ORAL | Status: DC | PRN
  Administered 2021-11-17: 2 via ORAL
  Administered 2021-11-19 – 2021-11-26 (×7): 1 via ORAL
  Administered 2021-11-27: 2 via ORAL
  Administered 2021-11-29: 1 via ORAL
  Administered 2021-11-30: 2 via ORAL
  Administered 2021-12-02: 1 via ORAL
  Administered 2021-12-04: 2 via ORAL
  Administered 2021-12-05: 1 via ORAL
  Administered 2021-12-06 – 2021-12-10 (×7): 2 via ORAL
  Administered 2021-12-11 – 2021-12-14 (×6): 1 via ORAL
  Administered 2021-12-15: 2 via ORAL
  Administered 2021-12-16 – 2021-12-17 (×5): 1 via ORAL
  Administered 2021-12-18 – 2022-01-03 (×16): 2 via ORAL
  Administered 2022-01-04: 1 via ORAL
  Administered 2022-01-05 – 2022-01-06 (×3): 2 via ORAL
  Administered 2022-01-06 – 2022-01-07 (×3): 1 via ORAL
  Administered 2022-01-08 – 2022-01-10 (×2): 2 via ORAL
  Administered 2022-01-12: 1 via ORAL
  Administered 2022-01-13 – 2022-01-14 (×2): 2 via ORAL
  Administered 2022-01-14: 1 via ORAL
  Administered 2022-01-15: 2 via ORAL
  Administered 2022-01-16 – 2022-01-19 (×2): 1 via ORAL
  Administered 2022-01-19: 2 via ORAL
  Administered 2022-01-20: 1 via ORAL
  Administered 2022-01-21 – 2022-01-22 (×3): 2 via ORAL
  Administered 2022-01-23 – 2022-01-25 (×2): 1 via ORAL
  Administered 2022-01-25: 2 via ORAL
  Administered 2022-01-26: 1 via ORAL
  Administered 2022-01-26 – 2022-01-28 (×3): 2 via ORAL
  Administered 2022-01-29: 1 via ORAL
  Administered 2022-01-30 – 2022-02-04 (×9): 2 via ORAL
  Administered 2022-02-04: 1 via ORAL
  Administered 2022-02-05: 2 via ORAL
  Filled 2021-11-14: qty 2
  Filled 2021-11-14: qty 1
  Filled 2021-11-14 (×2): qty 2
  Filled 2021-11-14: qty 1
  Filled 2021-11-14: qty 2
  Filled 2021-11-14: qty 1
  Filled 2021-11-14: qty 2
  Filled 2021-11-14 (×2): qty 1
  Filled 2021-11-14 (×4): qty 2
  Filled 2021-11-14 (×2): qty 1
  Filled 2021-11-14 (×5): qty 2
  Filled 2021-11-14: qty 1
  Filled 2021-11-14 (×2): qty 2
  Filled 2021-11-14 (×2): qty 1
  Filled 2021-11-14 (×2): qty 2
  Filled 2021-11-14: qty 1
  Filled 2021-11-14: qty 2
  Filled 2021-11-14: qty 1
  Filled 2021-11-14: qty 2
  Filled 2021-11-14: qty 1
  Filled 2021-11-14: qty 2
  Filled 2021-11-14: qty 1
  Filled 2021-11-14: qty 2
  Filled 2021-11-14 (×2): qty 1
  Filled 2021-11-14: qty 2
  Filled 2021-11-14: qty 1
  Filled 2021-11-14: qty 2
  Filled 2021-11-14: qty 1
  Filled 2021-11-14: qty 2
  Filled 2021-11-14: qty 1
  Filled 2021-11-14: qty 2
  Filled 2021-11-14 (×6): qty 1
  Filled 2021-11-14: qty 2
  Filled 2021-11-14: qty 1
  Filled 2021-11-14: qty 2
  Filled 2021-11-14: qty 1
  Filled 2021-11-14 (×7): qty 2
  Filled 2021-11-14 (×2): qty 1
  Filled 2021-11-14 (×4): qty 2
  Filled 2021-11-14: qty 1
  Filled 2021-11-14 (×3): qty 2
  Filled 2021-11-14: qty 1
  Filled 2021-11-14: qty 2
  Filled 2021-11-14 (×2): qty 1
  Filled 2021-11-14 (×3): qty 2
  Filled 2021-11-14: qty 1
  Filled 2021-11-14 (×2): qty 2
  Filled 2021-11-14 (×2): qty 1
  Filled 2021-11-14: qty 2
  Filled 2021-11-14: qty 1
  Filled 2021-11-14 (×3): qty 2
  Filled 2021-11-14 (×2): qty 1
  Filled 2021-11-14 (×2): qty 2
  Filled 2021-11-14: qty 1
  Filled 2021-11-14: qty 2

## 2021-11-14 MED ORDER — POLYETHYLENE GLYCOL 3350 17 G PO PACK
17.0000 g | PACK | Freq: Every day | ORAL | Status: DC | PRN
  Filled 2021-11-14: qty 1

## 2021-11-14 MED ORDER — WARFARIN SODIUM 7.5 MG PO TABS: 7.5000 mg | ORAL_TABLET | ORAL | Status: DC

## 2021-11-14 MED ORDER — LORATADINE 10 MG PO TABS
10.0000 mg | ORAL_TABLET | Freq: Every day | ORAL | Status: DC
  Administered 2021-11-14 – 2022-02-05 (×84): 10 mg via ORAL
  Filled 2021-11-14 (×84): qty 1

## 2021-11-14 MED ORDER — HYDROCORTISONE 1 % EX CREA
TOPICAL_CREAM | Freq: Two times a day (BID) | CUTANEOUS | Status: DC
  Administered 2021-11-14 – 2022-01-23 (×12): 1 via TOPICAL
  Filled 2021-11-14 (×8): qty 28

## 2021-11-14 MED ORDER — WARFARIN SODIUM 7.5 MG PO TABS
7.5000 mg | ORAL_TABLET | Freq: Once | ORAL | Status: AC
  Administered 2021-11-14: 7.5 mg via ORAL
  Filled 2021-11-14 (×2): qty 1

## 2021-11-14 MED ORDER — VENLAFAXINE HCL ER 75 MG PO CP24
75.0000 mg | ORAL_CAPSULE | Freq: Every day | ORAL | Status: DC
  Administered 2021-11-14 – 2022-02-05 (×82): 75 mg via ORAL
  Filled 2021-11-14 (×108): qty 1

## 2021-11-14 MED ORDER — ALBUTEROL SULFATE HFA 108 (90 BASE) MCG/ACT IN AERS: 1.0000 | INHALATION_SPRAY | Freq: Four times a day (QID) | RESPIRATORY_TRACT | Status: DC | PRN

## 2021-11-14 MED ORDER — WARFARIN SODIUM 1 MG PO TABS: 1.0000 mg | ORAL_TABLET | ORAL | Status: DC

## 2021-11-14 MED ORDER — POTASSIUM CHLORIDE CRYS ER 10 MEQ PO TBCR
10.0000 meq | EXTENDED_RELEASE_TABLET | Freq: Two times a day (BID) | ORAL | Status: DC
  Administered 2021-11-14 – 2022-02-05 (×167): 10 meq via ORAL
  Filled 2021-11-14 (×168): qty 1

## 2021-11-14 MED ORDER — NYSTATIN 100000 UNIT/GM EX POWD
Freq: Once | CUTANEOUS | Status: AC
  Filled 2021-11-14: qty 15

## 2021-11-14 MED ORDER — MOMETASONE FURO-FORMOTEROL FUM 200-5 MCG/ACT IN AERO
2.0000 | INHALATION_SPRAY | Freq: Two times a day (BID) | RESPIRATORY_TRACT | Status: DC
  Administered 2021-11-14 – 2022-02-05 (×156): 2 via RESPIRATORY_TRACT
  Filled 2021-11-14 (×9): qty 8.8

## 2021-11-14 MED ORDER — CALCIPOTRIENE 0.005 % EX CREA: 1.0000 | TOPICAL_CREAM | Freq: Two times a day (BID) | CUTANEOUS | Status: DC

## 2021-11-14 NOTE — ED Provider Notes (Signed)
Emergency Medicine Observation Re-evaluation Note  Beth Marshall is a 56 y.o. female, seen on rounds today.  Pt initially presented to the ED for complaints of Back Pain Currently, the patient is resting comfortably.  Physical Exam  BP (!) 101/43 (BP Location: Right Wrist)   Pulse 79   Temp 98.4 F (36.9 C) (Oral)   Resp 16   Ht '5\' 6"'$  (1.676 m)   Wt (!) 218.2 kg   SpO2 100%   BMI 77.64 kg/m  Physical Exam General: resting Cardiac: regular rate Lungs: Satting 100% on room air Psych: Acting appropriately  ED Course / MDM  EKG:   I have reviewed the labs performed to date as well as medications administered while in observation.  Recent changes in the last 24 hours include medication reconciliation and PT evaluation.  Plan  Current plan is for PT evaluation and TOC for SNF placement.    Elgie Congo, MD 11/14/21 [phone]

## 2021-11-14 NOTE — Progress Notes (Signed)
[place] for Warfarin Indication:  paroxysmal A-fib and recurrent DVT history of factor V Leiden mutation and protein S deficiency  Allergies  Allergen Reactions   Cephalexin Other (See Comments)    Unknown reaction per [facility]    Chocolate Other (See Comments)    Unknown reaction per [facility]   Codeine Nausea And Vomiting   Diphenhydramine Hcl Itching   Ranitidine Rash    Patient Measurements: Height: '5\' 6"'$  (167.6 cm) Weight: (!) 218.2 kg (481 lb) IBW/kg (Calculated) : 59.3 Vital Signs: Temp: 97.8 F (36.6 C) (10/28 0627) Temp Source: Oral (10/28 0627) BP: 113/55 (10/28 0627) Pulse Rate: 57 (10/28 0627)  Labs: Recent Labs    11/12/21 0609 11/12/21 0801 11/13/21 1827 11/14/21 0422  HGB 11.3*  --   --  10.3*  HCT 37.3  --   --  32.7*  PLT 267  --   --  222  LABPROT 22.6*  --  23.8* 25.7*  INR 2.0*  --  2.1* 2.4*  CREATININE 0.49  --   --   --   CKTOTAL 54  --   --   --   TROPONINIHS 4 4  --   --      Estimated Creatinine Clearance: 157.8 mL/min (by C-G formula based on SCr of 0.49 mg/dL).   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 ([place]) 10/27/2015   Dizziness 02/18/2015   Edema of extremities 02/18/2015   Elevated blood pressure reading 03/20/2015   Encounter for screening mammogram for breast cancer 10/27/2015   Factor V Leiden mutation ([place]) 10/27/2015   Gastroesophageal reflux disease 02/18/2015   Generalized anxiety disorder 02/18/2015   High risk medication use 07/25/2015   History of DVT (deep vein thrombosis) 03/15/2018   Hypokalemia 05/16/2015   Impaired fasting glucose 02/18/2015   Insomnia 02/18/2015   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   Lesion of ovary 02/18/2015   Lipoma  02/18/2015   Long term current use of anticoagulant therapy 03/20/2015   Migraine without aura and without status migrainosus, not intractable 06/25/2015   Moderate episode of recurrent major depressive disorder ([place]) 08/02/2018   Obesity, morbid, BMI 50 or higher ([place]) 02/18/2015   OSA (obstructive sleep apnea) 02/18/2015   Osteoarthritis 02/18/2015   Primary osteoarthritis of both knees 08/30/2017   Primary osteoarthritis of left knee 04/22/2015   Protein S deficiency ([place]) 02/18/2015   Shortness of breath 02/18/2015   Tachycardia 07/01/2015   Tachycardia 07/01/2015    Medications:  (Not in a hospital admission)  Scheduled:  Infusions:  PRN:   Assessment: 53 yof with a history of factor V leiden on wawrfarin, morbid obesity, bedbound status, recent left tib/fib. Patient is presenting with back pain, bilateral leg pain. Warfarin per pharmacy consult placed for  paroxysmal A-fib and recurrent DVT history of factor V Leiden mutation and protein S deficiency .  PTA dosing: 7.'5mg'$  daily except for '8mg'$  on Tue/Thur  INR therapeutic this AM at 2.4  Goal of Therapy:  INR Goal 2-3 Monitor platelets by anticoagulation protocol: Yes   Plan:  Warfarin 7.'5mg'$  PO x 1 today Daily INR, s/s bleeding  Bertis Ruddy, PharmD Clinical Pharmacist ED Pharmacist Phone # [phone] 11/14/2021 10:44 AM

## 2021-11-14 NOTE — Evaluation (Signed)
Physical Therapy Evaluation Patient Details Name: Beth Marshall MRN: [id number] DOB: 04-06-1965 Today's Date: 11/14/2021  History of Present Illness  Pt is a 56 y.o. female who presented 11/12/21 with back pain and x4 month hx of intermittent L leg pain, weakness, and numbness. Pt has been at [facility] for rehab since June secondary to chronic back pain s/p L tib fib fx. PMH: OA, back pain, hx of DVT, edema, factor V leiden mutation, GERD, anxiety, hypokalemia, insomnia, intrinsic asthma, lipoma, migraine, obesity, tachycardia   Clinical Impression  Pt presents with condition above and deficits mentioned below, see PT Problem List. Prior to her lower extremity fractures resulting in her SNF rehab stay that began in June of this year, she was able to ambulate short distances mod I using her RW or rollator. While at the SNF, she has been able to pull up to stand using a bar anterior to her with assistance (standing for up to ~15 sec at a time), but has not been able to take a step yet. She was using a hoyer lift for transfers bed <> w/c. Currently, pt demonstrates deficits in ROM due to increased body habitus, along with deficits in overall strength, endurance, and balance. She required min-modA for bed mobility today. Unable to safely attempt transferring to stand with lack of +2 assistance considering pt's short stature, BMI, and that she is on an air mattress. Recommending pt return to a SNF for rehab as pt desires to improve and eventually d/c home, but her mother is unable to care for her at this level of assistance. Will continue to follow acutely.     Recommendations for follow up therapy are one component of a multi-disciplinary discharge planning process, led by the attending physician.  Recommendations may be updated based on patient status, additional functional criteria and insurance authorization.  Follow Up Recommendations Skilled nursing-short term rehab (<3 hours/day) Can  patient physically be transported by private vehicle: No    Assistance Recommended at Discharge Intermittent Supervision/Assistance  Patient can return home with the following  Two people to help with walking and/or transfers;A lot of help with bathing/dressing/bathroom;Assistance with cooking/housework;Assist for transportation;Help with stairs or ramp for entrance    Equipment Recommendations Other (comment) (defer to next venue of care)  Recommendations for Other Services  OT consult    Functional Status Assessment Patient has had a recent decline in their functional status and demonstrates the ability to make significant improvements in function in a reasonable and predictable amount of time.     Precautions / Restrictions Precautions Precautions: Fall;Other (comment) Precaution Comments: obese Restrictions Weight Bearing Restrictions: No Other Position/Activity Restrictions: confirmed WBAT bil lower extremity per Dr. Nechama Guard 11/14/21      Mobility  Bed Mobility Overal bed mobility: Needs Assistance Bed Mobility: Rolling, Supine to Sit, Sit to Supine Rolling: Min assist   Supine to sit: Min assist, HOB elevated Sit to supine: Mod assist, HOB elevated   General bed mobility comments: MinA to bring leg across body to roll side to side with pt giving good initiation and pull on rails. MinA to ascend trunk to sit EOB with HOB elevated and use of rails. ModA to lift each leg onto bed to return to supine.    Transfers                   General transfer comment: unable to safely attempt with lack of +2 and pt's short stature and obesity on an air mattress. Attempted  deflating mattress to allow pt to touch ground with her feet, but that caused pain due to bed frame pushing into the legs    Ambulation/Gait               General Gait Details: unable  Stairs            Wheelchair Mobility    Modified Rankin (Stroke Patients Only)       Balance  Overall balance assessment: Needs assistance Sitting-balance support: No upper extremity supported, Feet supported, Feet unsupported Sitting balance-[name]: Fair Sitting balance - Comments: Static sitting EOB without LOB, intermittent no foot support. Pt leans posteriorly and requires UE support when her dynamic balance is challenged       Standing balance comment: unable to safely attempt today                             Pertinent Vitals/Pain Pain Assessment Pain Assessment: Faces Faces Pain Scale: Hurts little more Pain Location: regions of skin breakdown in folds of legs Pain Descriptors / Indicators: Discomfort, Grimacing Pain Intervention(s): Limited activity within patient's tolerance, Monitored during session, Repositioned    Home Living Family/patient expects to be discharged to:: Skilled nursing facility                   Additional Comments: had been at a SNF for rehab since June when pt had leg fxs, lived an apartment (no longer has it) prior to that with an aide coming x3 hours/day    Prior Function Prior Level of Function : Needs assist       Physical Assist : Mobility (physical);ADLs (physical) Mobility (physical): Transfers;Bed mobility;Gait   Mobility Comments: Prior to fxs resulting in SNF stay in June, she would walk short distances mod I with a RW vs rollator. While in the SNF, she has not yet been able to take a step, requiring asisstance to pull up to stand on a bar anterior to her. Pt was requiring hoyer lift bed <> w/c and assistance to propel the w/c. Pt required assistance to bring legs up onto bed with return to supine, but could get supine > sit EOB with mod I. ADLs Comments: Required assistance for dressing and other ADLs while in SNF     Hand Dominance        Extremity/Trunk Assessment   Upper Extremity Assessment Upper Extremity Assessment: Defer to OT evaluation    Lower Extremity Assessment Lower Extremity  Assessment: RLE deficits/detail;LLE deficits/detail RLE Deficits / Details: MMT scores of grossly 4 to 4+; denied numbness/tingling at this time; edema and obesity with skin breakdown noted in folds of knees LLE Deficits / Details: MMT scores of grossly 4 to 4+; denied numbness/tingling at this time, but reports entire L leg will go numb randomly at times (no specific position that causes it per pt); edema and obesity with skin breakdown noted in folds of knees    Cervical / Trunk Assessment Cervical / Trunk Assessment: Other exceptions Cervical / Trunk Exceptions: increased body habitus  Communication   Communication: No difficulties  Cognition Arousal/Alertness: Awake/alert Behavior During Therapy: WFL for tasks assessed/performed Overall Cognitive Status: Within Functional Limits for tasks assessed                                 General Comments: Tangential at times, but redirectable  General Comments      Exercises     Assessment/Plan    PT Assessment Patient needs continued PT services  PT Problem List Decreased strength;Decreased activity tolerance;Decreased balance;Decreased mobility;Decreased range of motion;Obesity;Decreased skin integrity       PT Treatment Interventions DME instruction;Functional mobility training;Therapeutic activities;Therapeutic exercise;Balance training;Neuromuscular re-education;Patient/family education;Wheelchair mobility training    PT Goals (Current goals can be found in the Care Plan section)  Acute Rehab PT Goals Patient Stated Goal: to go to a new SNF for rehab then home PT Goal Formulation: With patient Time For Goal Achievement: 11/28/21 Potential to Achieve Goals: Fair    Frequency Min 2X/week     Co-evaluation               AM-PAC PT "6 Clicks" Mobility  Outcome Measure Help needed turning from your back to your side while in a flat bed without using bedrails?: A Little Help needed moving from  lying on your back to sitting on the side of a flat bed without using bedrails?: A Little Help needed moving to and from a bed to a chair (including a wheelchair)?: Total Help needed standing up from a chair using your arms (e.g., wheelchair or bedside chair)?: Total Help needed to walk in hospital room?: Total Help needed climbing 3-5 steps with a railing? : Total 6 Click Score: 10    End of Session   Activity Tolerance: Patient tolerated treatment well Patient left: in bed;with call bell/phone within reach Nurse Communication: Mobility status;Other (comment) (skin breakdown in knee folds) PT Visit Diagnosis: Muscle weakness (generalized) (M62.81);Difficulty in walking, not elsewhere classified (R26.2)    Time: 5277-8242 PT Time Calculation (min) (ACUTE ONLY): 38 min   Charges:   PT Evaluation $PT Eval Moderate Complexity: 1 Mod PT Treatments $Therapeutic Activity: 23-37 mins        Moishe Spice, PT, DPT Acute Rehabilitation Services  Office: [phone]   Orvan Falconer 11/14/2021, 10:16 AM

## 2021-11-14 NOTE — ED Notes (Signed)
Warfarin dose requested from pharmacy

## 2021-11-14 NOTE — ED Notes (Signed)
Pt cleaned and changed. Purwick replaced. Cannister changed. Pt comfortable, eating dinner at this time.

## 2021-11-15 DIAGNOSIS — M545 Low back pain, unspecified: Secondary | ICD-10-CM | POA: Diagnosis not present

## 2021-11-15 LAB — CBC WITH DIFFERENTIAL/PLATELET
Abs Immature Granulocytes: 0.02 10*3/uL (ref 0.00–0.07)
Basophils Absolute: 0 10*3/uL (ref 0.0–0.1)
Basophils Relative: 1 %
Eosinophils Absolute: 0.2 10*3/uL (ref 0.0–0.5)
Eosinophils Relative: 2 %
HCT: 34.2 % — ABNORMAL LOW (ref 36.0–46.0)
Hemoglobin: 11.1 g/dL — ABNORMAL LOW (ref 12.0–15.0)
Immature Granulocytes: 0 %
Lymphocytes Relative: 26 %
Lymphs Abs: 2 10*3/uL (ref 0.7–4.0)
MCH: 28.1 pg (ref 26.0–34.0)
MCHC: 32.5 g/dL (ref 30.0–36.0)
MCV: 86.6 fL (ref 80.0–100.0)
Monocytes Absolute: 0.7 10*3/uL (ref 0.1–1.0)
Monocytes Relative: 10 %
Neutro Abs: 4.6 10*3/uL (ref 1.7–7.7)
Neutrophils Relative %: 61 %
Platelets: 278 10*3/uL (ref 150–400)
RBC: 3.95 MIL/uL (ref 3.87–5.11)
RDW: 16.2 % — ABNORMAL HIGH (ref 11.5–15.5)
WBC: 7.6 10*3/uL (ref 4.0–10.5)
nRBC: 0 % (ref 0.0–0.2)

## 2021-11-15 LAB — PROTIME-INR
INR: 2.2 — ABNORMAL HIGH (ref 0.8–1.2)
Prothrombin Time: 24.2 seconds — ABNORMAL HIGH (ref 11.4–15.2)

## 2021-11-15 MED ORDER — WARFARIN SODIUM 7.5 MG PO TABS
7.5000 mg | ORAL_TABLET | ORAL | Status: DC
  Administered 2021-11-15: 7.5 mg via ORAL
  Filled 2021-11-15 (×2): qty 1

## 2021-11-15 MED ORDER — WARFARIN SODIUM 4 MG PO TABS: 8.0000 mg | ORAL_TABLET | ORAL | Status: DC

## 2021-11-15 NOTE — Progress Notes (Addendum)
CSW uploaded additional notes to [facility] MUST for review.  CSW spoke with Jackelyn Poling at [facility] who states the facility cannot accept the patient as she weighs more than 425lbs.  CSW sent patient's clinicals to additional facilities for review.  Madilyn Fireman, MSW, LCSW Transitions of Care  Clinical Social Worker II [phone]

## 2021-11-15 NOTE — Progress Notes (Signed)
[place] for Warfarin Indication:  paroxysmal A-fib and recurrent DVT history of factor V Leiden mutation and protein S deficiency  Allergies  Allergen Reactions   Cephalexin Other (See Comments)    Unknown reaction per [facility]    Chocolate Other (See Comments)    Unknown reaction per [facility]   Codeine Nausea And Vomiting   Diphenhydramine Hcl Itching   Ranitidine Rash    Patient Measurements: Height: '5\' 6"'$  (167.6 cm) Weight: (!) 218.2 kg (481 lb) IBW/kg (Calculated) : 59.3 Vital Signs: Temp: 98.2 F (36.8 C) (10/29 0728) Temp Source: Oral (10/29 0728) BP: 116/54 (10/29 0728) Pulse Rate: 72 (10/29 0728)  Labs: Recent Labs    11/13/21 1827 11/14/21 0422 11/15/21 1745  HGB  --  10.3* 11.1*  HCT  --  32.7* 34.2*  PLT  --  222 278  LABPROT 23.8* 25.7* 24.2*  INR 2.1* 2.4* 2.2*     Estimated Creatinine Clearance: 157.8 mL/min (by C-G formula based on SCr of 0.49 mg/dL).   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 ([place]) 10/27/2015   Dizziness 02/18/2015   Edema of extremities 02/18/2015   Elevated blood pressure reading 03/20/2015   Encounter for screening mammogram for breast cancer 10/27/2015   Factor V Leiden mutation ([place]) 10/27/2015   Gastroesophageal reflux disease 02/18/2015   Generalized anxiety disorder 02/18/2015   High risk medication use 07/25/2015   History of DVT (deep vein thrombosis) 03/15/2018   Hypokalemia 05/16/2015   Impaired fasting glucose 02/18/2015   Insomnia 02/18/2015   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   Lesion of ovary 02/18/2015   Lipoma 02/18/2015   Long term current use of anticoagulant therapy 03/20/2015   Migraine without aura and without status migrainosus, not  intractable 06/25/2015   Moderate episode of recurrent major depressive disorder ([place]) 08/02/2018   Obesity, morbid, BMI 50 or higher ([place]) 02/18/2015   OSA (obstructive sleep apnea) 02/18/2015   Osteoarthritis 02/18/2015   Primary osteoarthritis of both knees 08/30/2017   Primary osteoarthritis of left knee 04/22/2015   Protein S deficiency ([place]) 02/18/2015   Shortness of breath 02/18/2015   Tachycardia 07/01/2015   Tachycardia 07/01/2015    Medications:  (Not in a hospital admission)  Scheduled:  Infusions:  PRN:   Assessment: 68 yof with a history of factor V leiden on wawrfarin, morbid obesity, bedbound status, recent left tib/fib. Patient is presenting with back pain, bilateral leg pain. Warfarin per pharmacy consult placed for  paroxysmal A-fib and recurrent DVT history of factor V Leiden mutation and protein S deficiency .  PTA dosing: 7.'5mg'$  daily except for '8mg'$  on Tue/Thur  INR remains therapeutic  Goal of Therapy:  INR Goal 2-3 Monitor platelets by anticoagulation protocol: Yes   Plan:  Continue home regimen of warfarin Daily INR, s/s bleeding  Bertis Ruddy, PharmD Clinical Pharmacist ED Pharmacist Phone # [phone] 11/15/2021 7:22 PM

## 2021-11-15 NOTE — ED Provider Notes (Signed)
Emergency Medicine Observation Re-evaluation Note  Beth Marshall is a 56 y.o. female, seen on rounds today.  Pt initially presented to the ED for complaints of Back Pain Currently, the patient is resting comfortably, inquires about decreasing her lasix as she is urinating frequently.  Physical Exam  BP (!) 116/54 (BP Location: Right Wrist)   Pulse 72   Temp 98.2 F (36.8 C) (Oral)   Resp 15   Ht '5\' 6"'$  (1.676 m)   Wt (!) 218.2 kg   SpO2 100%   BMI 77.64 kg/m  Physical Exam General: No distress Lungs: Resp even and unlabored Psych: Calm  ED Course / MDM  EKG:   I have reviewed the labs performed to date as well as medications administered while in observation.  Recent changes in the last 24 hours include none.  Plan  Current plan is for [facility] for SNF placement. Advised that continuing her lasix is beneficial as that may help to decrease the size of her legs which is contributing to her immobility.     Truddie Hidden, MD 11/15/21 1020

## 2021-11-16 DIAGNOSIS — M545 Low back pain, unspecified: Secondary | ICD-10-CM | POA: Diagnosis not present

## 2021-11-16 LAB — CBC WITH DIFFERENTIAL/PLATELET
Abs Immature Granulocytes: 0.03 10*3/uL (ref 0.00–0.07)
Basophils Absolute: 0.1 10*3/uL (ref 0.0–0.1)
Basophils Relative: 1 %
Eosinophils Absolute: 0.2 10*3/uL (ref 0.0–0.5)
Eosinophils Relative: 2 %
HCT: 37.6 % (ref 36.0–46.0)
Hemoglobin: 11.3 g/dL — ABNORMAL LOW (ref 12.0–15.0)
Immature Granulocytes: 0 %
Lymphocytes Relative: 25 %
Lymphs Abs: 2.1 10*3/uL (ref 0.7–4.0)
MCH: 26.6 pg (ref 26.0–34.0)
MCHC: 30.1 g/dL (ref 30.0–36.0)
MCV: 88.5 fL (ref 80.0–100.0)
Monocytes Absolute: 0.7 10*3/uL (ref 0.1–1.0)
Monocytes Relative: 8 %
Neutro Abs: 5.3 10*3/uL (ref 1.7–7.7)
Neutrophils Relative %: 64 %
Platelets: 268 10*3/uL (ref 150–400)
RBC: 4.25 MIL/uL (ref 3.87–5.11)
RDW: 16.4 % — ABNORMAL HIGH (ref 11.5–15.5)
WBC: 8.3 10*3/uL (ref 4.0–10.5)
nRBC: 0 % (ref 0.0–0.2)

## 2021-11-16 LAB — PROTIME-INR
INR: 2 — ABNORMAL HIGH (ref 0.8–1.2)
Prothrombin Time: 22.8 seconds — ABNORMAL HIGH (ref 11.4–15.2)

## 2021-11-16 MED ORDER — WARFARIN SODIUM 7.5 MG PO TABS
8.5000 mg | ORAL_TABLET | Freq: Once | ORAL | Status: AC
  Administered 2021-11-16: 8.5 mg via ORAL
  Filled 2021-11-16: qty 1

## 2021-11-16 NOTE — ED Notes (Signed)
Patient linen and sheets changed

## 2021-11-16 NOTE — ED Notes (Signed)
Patient had a bowel movement and urinated. Patient cleaned up and new pads and purewick placed

## 2021-11-16 NOTE — Evaluation (Signed)
Occupational Therapy Evaluation Patient Details Name: Beth Marshall MRN: [id number] DOB: 19-Jul-1965 Today's Date: 11/16/2021   History of Present Illness Pt is a 56 y.o. female who presented 11/12/21 with back pain and x4 month hx of intermittent L leg pain, weakness, and numbness. Pt has been at [facility] for rehab since June secondary to chronic back pain s/p L tib fib fx. PMH: OA, back pain, hx of DVT, edema, factor V leiden mutation, GERD, anxiety, hypokalemia, insomnia, intrinsic asthma, lipoma, migraine, obesity, tachycardia   Clinical Impression   Beth Marshall was evaluated s/p the above admission list, she is from SNF and staff assists with all ADLs at bed level and she was working with therapies to stand from [facility]. Prior to fall in June, she was relatively indep. Upon evaluation pt is min-mod A for bed mobility and is able to statically sit. Due to deficits listed below, she does required total A for LB ADLs and up to mod A for UB ADLs. OT to follow acutely on a trial basis due to pt being at her baseline since June. Recommend d/c back to SNF     Recommendations for follow up therapy are one component of a multi-disciplinary discharge planning process, led by the attending physician.  Recommendations may be updated based on patient status, additional functional criteria and insurance authorization.   Follow Up Recommendations  Skilled nursing-short term rehab (<3 hours/day)    Assistance Recommended at Discharge Frequent or constant Supervision/Assistance  Patient can return home with the following A lot of help with walking and/or transfers;A lot of help with bathing/dressing/bathroom;Assist for transportation;Help with stairs or ramp for entrance    Functional Status Assessment  Patient has had a recent decline in their functional status and demonstrates the ability to make significant improvements in function in a reasonable and predictable amount of time.  Equipment  Recommendations  Other (comment) (defer)       Precautions / Restrictions Precautions Precautions: Fall;Other (comment) Precaution Comments: obese Restrictions Weight Bearing Restrictions: No Other Position/Activity Restrictions: confirmed WBAT bil lower extremity per Dr. Nechama Guard 11/14/21      Mobility Bed Mobility Overal bed mobility: Needs Assistance Bed Mobility: Rolling, Supine to Sit, Sit to Supine Rolling: Mod assist   Supine to sit: Mod assist Sit to supine: Mod assist        Transfers Overall transfer level: Needs assistance                 General transfer comment: unable to attempt      Balance Overall balance assessment: Needs assistance Sitting-balance support: No upper extremity supported, Feet supported, Feet unsupported Sitting balance-[name]: Fair                                     ADL either performed or assessed with clinical judgement   ADL Overall ADL's : Needs assistance/impaired Eating/Feeding: Independent;Sitting   Grooming: Set up;Sitting   Upper Body Bathing: Moderate assistance;Sitting   Lower Body Bathing: Total assistance   Upper Body Dressing : Moderate assistance;Sitting   Lower Body Dressing: Total assistance   Toilet Transfer: Total assistance   Toileting- Clothing Manipulation and Hygiene: Total assistance       Functional mobility during ADLs: Maximal assistance;Moderate assistance;Total assistance (bed level only. otherwise total A) General ADL Comments: will be a hoyer lift OOB. pt is able to roll with mod A at bed level  Vision Baseline Vision/History: 0 No visual deficits Vision Assessment?: No apparent visual deficits            Pertinent Vitals/Pain Pain Assessment Pain Assessment: Faces Faces Pain Scale: Hurts little more Pain Location: regions of skin breakdown in folds of legs Pain Descriptors / Indicators: Discomfort, Grimacing Pain Intervention(s): Limited activity  within patient's tolerance, Monitored during session     Hand Dominance     Extremity/Trunk Assessment Upper Extremity Assessment Upper Extremity Assessment: Generalized weakness       Cervical / Trunk Assessment Cervical / Trunk Assessment: Other exceptions Cervical / Trunk Exceptions: increased body habitus   Communication Communication Communication: No difficulties   Cognition Arousal/Alertness: Awake/alert Behavior During Therapy: WFL for tasks assessed/performed Overall Cognitive Status: Within Functional Limits for tasks assessed                                 General Comments: Tangential at times, but redirectable. limited insight to deficits     General Comments  VSS on RA            Home Living Family/patient expects to be discharged to:: Skilled nursing facility                                 Additional Comments: had been at a SNF for rehab since June when pt had leg fxs, lived an apartment (no longer has it) prior to that with an aide coming x3 hours/day      Prior Functioning/Environment Prior Level of Function : Needs assist       Physical Assist : Mobility (physical);ADLs (physical) Mobility (physical): Transfers;Bed mobility;Gait   Mobility Comments: Prior to fxs resulting in SNF stay in June, she would walk short distances mod I with a RW vs rollator. While in the SNF, she has not yet been able to take a step, requiring asisstance to pull up to stand on a bar anterior to her. Pt was requiring hoyer lift bed <> w/c and assistance to propel the w/c. Pt required assistance to bring legs up onto bed with return to supine, but could get supine > sit EOB with mod I. ADLs Comments: Required assistance for dressing and other ADLs at bed level while in SNF        OT Problem List: Decreased strength;Decreased activity tolerance;Decreased range of motion;Impaired balance (sitting and/or standing);Decreased safety  awareness;Decreased knowledge of use of DME or AE;Decreased knowledge of precautions;Pain      OT Treatment/Interventions: Self-care/ADL training;Therapeutic exercise;DME and/or AE instruction;Therapeutic activities;Patient/family education;Balance training    OT Goals(Current goals can be found in the care plan section) Acute Rehab OT Goals Patient Stated Goal: to go to SNF OT Goal Formulation: With patient Time For Goal Achievement: 11/30/21 Potential to Achieve Goals: Good ADL Goals Pt Will Transfer to Toilet: with mod assist;stand pivot transfer;bedside commode Additional ADL Goal #1: pt will complete bed mobility with min G as a precursor to ADLs Additional ADL Goal #2: Pt will stand from EOB with min A to assist in LB ADLs  OT Frequency: Min 2X/week       AM-PAC OT "6 Clicks" Daily Activity     Outcome Measure Help from another person eating meals?: None Help from another person taking care of personal grooming?: A Little Help from another person toileting, which includes using toliet, bedpan, or urinal?: Total Help  from another person bathing (including washing, rinsing, drying)?: A Lot Help from another person to put on and taking off regular upper body clothing?: A Lot Help from another person to put on and taking off regular lower body clothing?: Total 6 Click Score: 13   End of Session Nurse Communication: Mobility status  Activity Tolerance: Patient tolerated treatment well Patient left: in bed;with call bell/phone within reach;with bed alarm set  OT Visit Diagnosis: Unsteadiness on feet (R26.81);Other abnormalities of gait and mobility (R26.89);Muscle weakness (generalized) (M62.81);Pain Pain - Right/Left: Right                Time: 8938-1017 OT Time Calculation (min): 11 min Charges:  OT General Charges $OT Visit: 1 Visit OT Evaluation $OT Eval Moderate Complexity: 1 Mod   Harshika Mago D Causey 11/16/2021, 2:26 PM

## 2021-11-16 NOTE — Progress Notes (Signed)
[facility] for Warfarin Indication:  paroxysmal A-fib and recurrent DVT history of factor V Leiden mutation and protein S deficiency  Allergies  Allergen Reactions   Cephalexin Other (See Comments)    Unknown reaction per [facility]    Chocolate Other (See Comments)    Unknown reaction per [facility]   Codeine Nausea And Vomiting   Diphenhydramine Hcl Itching   Ranitidine Rash    Patient Measurements: Height: '5\' 6"'$  (167.6 cm) Weight: (!) 218.2 kg (481 lb) IBW/kg (Calculated) : 59.3 Vital Signs: BP: 135/62 (10/30 0700) Pulse Rate: 62 (10/30 0700)  Labs: Recent Labs    11/13/21 1827 11/14/21 0422 11/15/21 1745  HGB  --  10.3* 11.1*  HCT  --  32.7* 34.2*  PLT  --  222 278  LABPROT 23.8* 25.7* 24.2*  INR 2.1* 2.4* 2.2*     Estimated Creatinine Clearance: 157.8 mL/min (by C-G formula based on SCr of 0.49 mg/dL).   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 ([place]) 10/27/2015   Dizziness 02/18/2015   Edema of extremities 02/18/2015   Elevated blood pressure reading 03/20/2015   Encounter for screening mammogram for breast cancer 10/27/2015   Factor V Leiden mutation ([place]) 10/27/2015   Gastroesophageal reflux disease 02/18/2015   Generalized anxiety disorder 02/18/2015   High risk medication use 07/25/2015   History of DVT (deep vein thrombosis) 03/15/2018   Hypokalemia 05/16/2015   Impaired fasting glucose 02/18/2015   Insomnia 02/18/2015   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   Lesion of ovary 02/18/2015   Lipoma 02/18/2015   Long term current use of anticoagulant therapy 03/20/2015   Migraine without aura and without status migrainosus, not intractable 06/25/2015   Moderate episode of recurrent major  depressive disorder ([place]) 08/02/2018   Obesity, morbid, BMI 50 or higher ([place]) 02/18/2015   OSA (obstructive sleep apnea) 02/18/2015   Osteoarthritis 02/18/2015   Primary osteoarthritis of both knees 08/30/2017   Primary osteoarthritis of left knee 04/22/2015   Protein S deficiency ([place]) 02/18/2015   Shortness of breath 02/18/2015   Tachycardia 07/01/2015   Tachycardia 07/01/2015    Medications:  (Not in a hospital admission)  Scheduled:  Infusions:  PRN:   Assessment: 56 yo F with a history of factor V leiden on warfarin, morbid obesity, bedbound status, recent left tib/fib. Patient presented with back pain, bilateral leg pain. Warfarin per pharmacy consult placed for  paroxysmal A-fib and recurrent DVT history of factor V Leiden mutation and protein S deficiency .  PTA dosing: 7.'5mg'$  daily except for '8mg'$  on Tue/Thur  INR remains therapeutic at 2.0, but trending down Hgb trending up to 11.1. Plt trending up to 278.  Goal of Therapy:  INR Goal 2-3 Monitor platelets by anticoagulation protocol: Yes   Plan:  Increase warfarin dose to 8.'5mg'$  tonight Monitor daily INR and for s/sx bleeding  Luisa Hart, PharmD, BCPS Clinical Pharmacist 11/16/2021 1:35 PM   Please refer to [facility] for pharmacy phone number

## 2021-11-16 NOTE — ED Notes (Signed)
Patient cleaned up. Pads changed and purewick readjusted at this time

## 2021-11-16 NOTE — Progress Notes (Addendum)
2:15pm: CSW spoke with Neoma Laming at [facility] to request she review patient.  9:45am: CSW spoke with Janie at [facility] who states the facility cannot accept the patient as she weighs more than what the facility can accommodate.   Patient's PASSR # is [id number] E.  CSW spoke with Lavella Lemons at [facility] who states the facility is no longer admitting patient's from the ED.  Madilyn Fireman, MSW, LCSW Transitions of Care  Clinical Social Worker II [phone]

## 2021-11-17 DIAGNOSIS — M545 Low back pain, unspecified: Secondary | ICD-10-CM | POA: Diagnosis not present

## 2021-11-17 LAB — CBC WITH DIFFERENTIAL/PLATELET
Abs Immature Granulocytes: 0.03 10*3/uL (ref 0.00–0.07)
Basophils Absolute: 0 10*3/uL (ref 0.0–0.1)
Basophils Relative: 0 %
Eosinophils Absolute: 0.2 10*3/uL (ref 0.0–0.5)
Eosinophils Relative: 2 %
HCT: 36.7 % (ref 36.0–46.0)
Hemoglobin: 11.5 g/dL — ABNORMAL LOW (ref 12.0–15.0)
Immature Granulocytes: 0 %
Lymphocytes Relative: 23 %
Lymphs Abs: 2.4 10*3/uL (ref 0.7–4.0)
MCH: 27.2 pg (ref 26.0–34.0)
MCHC: 31.3 g/dL (ref 30.0–36.0)
MCV: 86.8 fL (ref 80.0–100.0)
Monocytes Absolute: 0.9 10*3/uL (ref 0.1–1.0)
Monocytes Relative: 9 %
Neutro Abs: 6.8 10*3/uL (ref 1.7–7.7)
Neutrophils Relative %: 66 %
Platelets: 244 10*3/uL (ref 150–400)
RBC: 4.23 MIL/uL (ref 3.87–5.11)
RDW: 16.5 % — ABNORMAL HIGH (ref 11.5–15.5)
WBC: 10.3 10*3/uL (ref 4.0–10.5)
nRBC: 0 % (ref 0.0–0.2)

## 2021-11-17 LAB — PROTIME-INR
INR: 2 — ABNORMAL HIGH (ref 0.8–1.2)
Prothrombin Time: 22.8 seconds — ABNORMAL HIGH (ref 11.4–15.2)

## 2021-11-17 MED ORDER — WARFARIN SODIUM 7.5 MG PO TABS
7.5000 mg | ORAL_TABLET | Freq: Once | ORAL | Status: AC
  Administered 2021-11-17: 7.5 mg via ORAL
  Filled 2021-11-17: qty 1

## 2021-11-17 NOTE — Progress Notes (Signed)
Physical Therapy Treatment Patient Details Name: Beth Marshall MRN: [id number] DOB: [birth year] Today's Date: 11/17/2021   History of Present Illness Pt is a 55 y.o. female admitted from SNF rehab on 11/12/21 with back pain and x4 month and intermittent L leg pain, weakness, and numbness. Pt has been at [facility] for rehab since June secondary to chronic back pain s/p L tib fib fx (BLE WBAT). PMH includes OA, back pain, hx of DVT, edema, factor V leiden mutation, GERD, anxiety, hypokalemia, insomnia, intrinsic asthma, lipoma, migraine, obesity, tachycardia.   PT Comments    Pt progressing with mobility. Today's session focused on increasing WB through BLEs for eventual standing progression; pt still unsafe to stand from bariatric bed due to short stature, even with step placed under feet to bridge gap in height difference. Pt remains limited by generalized weakness, decreased activity tolerance, and impaired balance strategies/postural reactions. Pt remains in ED, so OOB mobility progression limited by room set-up and equipment available; transfer to floor room would be ideal. Continue to recommend SNF-level therapies to maximize functional mobility and independence prior to return home.    Recommendations for follow up therapy are one component of a multi-disciplinary discharge planning process, led by the attending physician.  Recommendations may be updated based on patient status, additional functional criteria and insurance authorization.  Follow Up Recommendations  Skilled nursing-short term rehab (<3 hours/day) Can patient physically be transported by private vehicle: No   Assistance Recommended at Discharge Intermittent Supervision/Assistance  Patient can return home with the following Two people to help with walking and/or transfers;A lot of help with bathing/dressing/bathroom;Assistance with cooking/housework;Assist for transportation;Help with stairs or ramp for entrance    Equipment Recommendations  Other (defer to next venue)    Recommendations for Other Services       Precautions / Restrictions Precautions Precautions: Fall;Other (comment) Precaution Comments: bladder/bowel incontinence Restrictions Weight Bearing Restrictions: No Other Position/Activity Restrictions: confirmed WBAT BLE per Dr. Nechama Guard 11/14/21     Mobility  Bed Mobility Overal bed mobility: Needs Assistance Bed Mobility: Rolling, Supine to Sit, Sit to Supine Rolling: Mod assist   Supine to sit: Min assist Sit to supine: Mod assist   General bed mobility comments: pt with good BLE movement to sit EOB, use of bed rails, minA for HHA to elevate trunk then pt able to reposition hips without assist; modA for BLE management return to supine; modA to assist with LE management and hip rotation rolling to L side for pericare; +2 assist to slide up, pt assisting well with bed rails    Transfers Overall transfer level: Needs assistance                 General transfer comment: pt's feet do not touch floor sitting EOB, therefore trialled step under pt's feet to bridge gap; pt able to WB through BLEs in this position but still unsafe to stand this session with room set-up and equipment available    Ambulation/Gait                   Stairs             Wheelchair Mobility    Modified Rankin (Stroke Patients Only)       Balance Overall balance assessment: Needs assistance Sitting-balance support: No upper extremity supported, Feet supported, Feet unsupported Sitting balance-[name]: Fair Sitting balance - Comments: preference for rail to stabilize and reposition, but not needed to maintain static sitting balance  Cognition Arousal/Alertness: Awake/alert Behavior During Therapy: WFL for tasks assessed/performed Overall Cognitive Status: Within Functional Limits for tasks assessed                                  General Comments: Tangential at times, but redirectable. limited insight to deficits        Exercises General Exercises - Lower Extremity Long Arc Quad: AROM, Both, Seated (with terminal knee flexion) Hip Flexion/Marching: AROM, Both, Seated (w/ static holds until fatigued (~5-sec); eccentric lowering)    General Comments General comments (skin integrity, edema, etc.): pt aware of bowel incontinence, depepdent for bed-level pericare. difficulty progressing OOB mobility with ED room set-up and equipment available. pt in good spirits and hopeful for d/c to rehab soon to continue working with therapies      Pertinent Vitals/Pain Pain Assessment Pain Assessment: Faces Faces Pain Scale: Hurts little more Pain Location: L knee with seated therex Pain Descriptors / Indicators: Discomfort, Sore Pain Intervention(s): Monitored during session    Home Living                          Prior Function            PT Goals (current goals can now be found in the care plan section) Progress towards PT goals: Progressing toward goals    Frequency    Min 2X/week      PT Plan Current plan remains appropriate    Co-evaluation              AM-PAC PT "6 Clicks" Mobility   Outcome Measure  Help needed turning from your back to your side while in a flat bed without using bedrails?: A Lot Help needed moving from lying on your back to sitting on the side of a flat bed without using bedrails?: A Lot Help needed moving to and from a bed to a chair (including a wheelchair)?: Total Help needed standing up from a chair using your arms (e.g., wheelchair or bedside chair)?: Total Help needed to walk in hospital room?: Total Help needed climbing 3-5 steps with a railing? : Total 6 Click Score: 8    End of Session   Activity Tolerance: Patient tolerated treatment well Patient left: in bed;with call bell/phone within reach Nurse Communication: Mobility  status;Other (comment) (need for new purewick) PT Visit Diagnosis: Muscle weakness (generalized) (M62.81);Difficulty in walking, not elsewhere classified (R26.2)     Time: 2458-0998 PT Time Calculation (min) (ACUTE ONLY): 34 min  Charges:  $Therapeutic Exercise: 8-22 mins $Therapeutic Activity: 8-22 mins                      Mabeline Caras, PT, DPT Acute Rehabilitation Services  Personal: [place] [facility] Office: [place] 11/17/2021, 10:24 AM

## 2021-11-17 NOTE — ED Notes (Signed)
Pt given bed bath, linens changed and purwick replaced.  Given PRN medications for left leg pain. Call bell within reach  Pt denies further needs at this time

## 2021-11-17 NOTE — Progress Notes (Signed)
[facility] for Warfarin Indication:  paroxysmal A-fib and recurrent DVT history of factor V Leiden mutation and protein S deficiency  Allergies  Allergen Reactions   Cephalexin Other (See Comments)    Unknown reaction per [facility]    Chocolate Other (See Comments)    Unknown reaction per [facility]   Codeine Nausea And Vomiting   Diphenhydramine Hcl Itching   Ranitidine Rash    Patient Measurements: Height: '5\' 6"'$  (167.6 cm) Weight: (!) 218.2 kg (481 lb) IBW/kg (Calculated) : 59.3 Vital Signs: Temp: 98.4 F (36.9 C) (10/31 0606) Temp Source: Oral (10/31 0606) BP: 128/68 (10/31 0606) Pulse Rate: 85 (10/31 0606)  Labs: Recent Labs    11/15/21 1745 11/16/21 1044 11/17/21 0435  HGB 11.1* 11.3* 11.5*  HCT 34.2* 37.6 36.7  PLT 278 268 244  LABPROT 24.2* 22.8* 22.8*  INR 2.2* 2.0* 2.0*     Estimated Creatinine Clearance: 157.8 mL/min (by C-G formula based on SCr of 0.49 mg/dL).   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 ([place]) 10/27/2015   Dizziness 02/18/2015   Edema of extremities 02/18/2015   Elevated blood pressure reading 03/20/2015   Encounter for screening mammogram for breast cancer 10/27/2015   Factor V Leiden mutation ([place]) 10/27/2015   Gastroesophageal reflux disease 02/18/2015   Generalized anxiety disorder 02/18/2015   High risk medication use 07/25/2015   History of DVT (deep vein thrombosis) 03/15/2018   Hypokalemia 05/16/2015   Impaired fasting glucose 02/18/2015   Insomnia 02/18/2015   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   Lesion of ovary 02/18/2015   Lipoma 02/18/2015   Long term current use of anticoagulant therapy 03/20/2015   Migraine without aura and without status migrainosus, not  intractable 06/25/2015   Moderate episode of recurrent major depressive disorder ([place]) 08/02/2018   Obesity, morbid, BMI 50 or higher ([place]) 02/18/2015   OSA (obstructive sleep apnea) 02/18/2015   Osteoarthritis 02/18/2015   Primary osteoarthritis of both knees 08/30/2017   Primary osteoarthritis of left knee 04/22/2015   Protein S deficiency ([place]) 02/18/2015   Shortness of breath 02/18/2015   Tachycardia 07/01/2015   Tachycardia 07/01/2015   Assessment: 56 yo F with a history of factor V leiden on warfarin, morbid obesity, bedbound status, recent left tib/fib. Patient presented with back pain, bilateral leg pain. Warfarin per pharmacy consult placed for  paroxysmal A-fib and recurrent DVT history of factor V Leiden mutation and protein S deficiency .  PTA dosing: 7.'5mg'$  daily except for '8mg'$  on Tue/Thur  INR remains therapeutic at 2.0. On the low end of goal but stable. Hgb and plt trending up  Goal of Therapy:  INR Goal 2-3 Monitor platelets by anticoagulation protocol: Yes   Plan:  Administer PTA dose of Warfarin 7.5 mg tonight Monitor daily INR and for s/sx bleeding  Titus Dubin, PharmD PGY1 Pharmacy Resident 11/17/2021 12:18 PM

## 2021-11-17 NOTE — Progress Notes (Signed)
CSW spoke with Neoma Laming at [facility] who states patient's clinicals are still under review by facility staff at this time.  Madilyn Fireman, MSW, LCSW Transitions of Care  Clinical Social Worker II [phone]

## 2021-11-18 DIAGNOSIS — M545 Low back pain, unspecified: Secondary | ICD-10-CM | POA: Diagnosis not present

## 2021-11-18 LAB — PROTIME-INR
INR: 2 — ABNORMAL HIGH (ref 0.8–1.2)
Prothrombin Time: 22 seconds — ABNORMAL HIGH (ref 11.4–15.2)

## 2021-11-18 LAB — CBC WITH DIFFERENTIAL/PLATELET
Abs Immature Granulocytes: 0.04 10*3/uL (ref 0.00–0.07)
Basophils Absolute: 0 10*3/uL (ref 0.0–0.1)
Basophils Relative: 0 %
Eosinophils Absolute: 0.2 10*3/uL (ref 0.0–0.5)
Eosinophils Relative: 2 %
HCT: 37.5 % (ref 36.0–46.0)
Hemoglobin: 11.5 g/dL — ABNORMAL LOW (ref 12.0–15.0)
Immature Granulocytes: 1 %
Lymphocytes Relative: 27 %
Lymphs Abs: 2.1 10*3/uL (ref 0.7–4.0)
MCH: 26.6 pg (ref 26.0–34.0)
MCHC: 30.7 g/dL (ref 30.0–36.0)
MCV: 86.8 fL (ref 80.0–100.0)
Monocytes Absolute: 0.7 10*3/uL (ref 0.1–1.0)
Monocytes Relative: 9 %
Neutro Abs: 4.8 10*3/uL (ref 1.7–7.7)
Neutrophils Relative %: 61 %
Platelets: 235 10*3/uL (ref 150–400)
RBC: 4.32 MIL/uL (ref 3.87–5.11)
RDW: 16.6 % — ABNORMAL HIGH (ref 11.5–15.5)
WBC: 7.9 10*3/uL (ref 4.0–10.5)
nRBC: 0 % (ref 0.0–0.2)

## 2021-11-18 MED ORDER — WARFARIN SODIUM 7.5 MG PO TABS
8.5000 mg | ORAL_TABLET | Freq: Once | ORAL | Status: AC
  Administered 2021-11-18: 8.5 mg via ORAL
  Filled 2021-11-18: qty 1

## 2021-11-18 NOTE — ED Notes (Signed)
Patient is resting comfortably with eyes closed. NAD Even and non-labored respirations

## 2021-11-18 NOTE — ED Notes (Signed)
Pt brief changed and cream applied to buttocks at pt request

## 2021-11-18 NOTE — Progress Notes (Signed)
[place] for Warfarin Indication:  paroxysmal A-fib and recurrent DVT history of factor V Leiden mutation and protein S deficiency  Allergies  Allergen Reactions   Cephalexin Other (See Comments)    Unknown reaction per [facility]    Chocolate Other (See Comments)    Unknown reaction per [facility]   Codeine Nausea And Vomiting   Diphenhydramine Hcl Itching   Ranitidine Rash    Patient Measurements: Height: '5\' 6"'$  (167.6 cm) Weight: (!) 218.2 kg (481 lb) IBW/kg (Calculated) : 59.3 Vital Signs: Temp: 98.4 F (36.9 C) (11/01 0609) Temp Source: Oral (11/01 0609) BP: 119/50 (11/01 0609) Pulse Rate: 63 (11/01 0609)  Labs: Recent Labs    11/16/21 1044 11/17/21 0435 11/18/21 0613  HGB 11.3* 11.5* 11.5*  HCT 37.6 36.7 37.5  PLT 268 244 235  LABPROT 22.8* 22.8* 22.0*  INR 2.0* 2.0* 2.0*     Estimated Creatinine Clearance: 157.8 mL/min (by C-G formula based on SCr of 0.49 mg/dL).  Assessment: 56 yo F with a history of factor V leiden on warfarin, morbid obesity, bedbound status, recent left tib/fib. Patient presented with back pain, bilateral leg pain. Warfarin per pharmacy consult placed for  paroxysmal A-fib and recurrent DVT history of factor V Leiden mutation and protein S deficiency .  PTA dosing: 8.'5mg'$  daily except for '8mg'$  on Tue/Thur  INR remains therapeutic at 2.0. On the low end of goal but stable. CBC is stable. No bleeding noted  Goal of Therapy:  INR Goal 2-3 Monitor platelets by anticoagulation protocol: Yes   Plan:  Warfarin 8.'5mg'$  PO x 1 today Daily INR  Salome Arnt, PharmD, BCPS, BCEMP Clinical Pharmacist Please see AMION for all pharmacy numbers 11/18/2021 7:45 AM

## 2021-11-19 DIAGNOSIS — M545 Low back pain, unspecified: Secondary | ICD-10-CM | POA: Diagnosis not present

## 2021-11-19 MED ORDER — WARFARIN SODIUM 7.5 MG PO TABS
8.5000 mg | ORAL_TABLET | Freq: Once | ORAL | Status: AC
  Administered 2021-11-19: 8.5 mg via ORAL
  Filled 2021-11-19: qty 1

## 2021-11-19 NOTE — Progress Notes (Signed)
[place] for Warfarin Indication:  paroxysmal A-fib and recurrent DVT history of factor V Leiden mutation and protein S deficiency  Allergies  Allergen Reactions   Cephalexin Other (See Comments)    Unknown reaction per [facility]    Chocolate Other (See Comments)    Unknown reaction per [facility]   Codeine Nausea And Vomiting   Diphenhydramine Hcl Itching   Ranitidine Rash    Patient Measurements: Height: '5\' 6"'$  (167.6 cm) Weight: (!) 218.2 kg (481 lb) IBW/kg (Calculated) : 59.3 Vital Signs: Temp: 97.5 F (36.4 C) (11/02 0345) Temp Source: Oral (11/02 0345) BP: 122/64 (11/02 0345) Pulse Rate: 66 (11/02 0345)  Labs: Recent Labs    11/16/21 1044 11/17/21 0435 11/18/21 0613  HGB 11.3* 11.5* 11.5*  HCT 37.6 36.7 37.5  PLT 268 244 235  LABPROT 22.8* 22.8* 22.0*  INR 2.0* 2.0* 2.0*     Estimated Creatinine Clearance: 157.8 mL/min (by C-G formula based on SCr of 0.49 mg/dL).  Assessment: 56 yo F with a history of factor V leiden on warfarin, morbid obesity, bedbound status, recent left tib/fib. Patient presented with back pain, bilateral leg pain. Warfarin per pharmacy consult placed for  paroxysmal A-fib and recurrent DVT history of factor V Leiden mutation and protein S deficiency .  PTA dosing: 8.'5mg'$  daily except for '8mg'$  on Tue/Thur  INR remains therapeutic at 2.0. On the low end of goal but stable. CBC is stable. No bleeding noted  Goal of Therapy:  INR Goal 2-3 Monitor platelets by anticoagulation protocol: Yes   Plan:  Warfarin 8.'5mg'$  PO x 1 today Daily INR  Luisa Hart, PharmD, BCPS Clinical Pharmacist 11/19/2021 11:31 AM   Please refer to AMION for pharmacy phone number

## 2021-11-19 NOTE — Progress Notes (Addendum)
Occupational Therapy Treatment Patient Details Name: Beth Marshall MRN: [id number] DOB: [birth year] Today's Date: 11/19/2021   History of present illness Pt is a 56 y.o. female admitted from SNF rehab on 11/12/21 with back pain and x4 month and intermittent L leg pain, weakness, and numbness. Pt has been at [facility] for rehab since June secondary to chronic back pain s/p L tib fib fx (BLE WBAT). PMH includes OA, back pain, hx of DVT, edema, factor V leiden mutation, GERD, anxiety, hypokalemia, insomnia, intrinsic asthma, lipoma, migraine, obesity, tachycardia.   OT comments  Pt remains eager to participate, able to demo extensive UE HEP with level 4 theraband well and implement modifications as instructed. Pt remains limited in OOB progression due to ED room and sizewise bed (has been unable to safely touch feet to floor for standing attempts). Pt anxious regarding plan for new SNF rehab placement, hopeful to discuss with social work further. Anticipate slower progress towards standing/transfer goals based on prolonged time in SNF rehab w/ current functional abilities, so goals downgraded accordingly. If pt able to move from ED to another floor, would benefit from bariatric Kreg tilt bed to safely progress WB through BLE. If pt not moved from ED, continued difficulty likely in progressing  OOB activities w/ therapy interventions and may warrant need to sign off acutely.   Recommendations for follow up therapy are one component of a multi-disciplinary discharge planning process, led by the attending physician.  Recommendations may be updated based on patient status, additional functional criteria and insurance authorization.    Follow Up Recommendations  Skilled nursing-short term rehab (<3 hours/day)    Assistance Recommended at Discharge Frequent or constant Supervision/Assistance  Patient can return home with the following  A lot of help with walking and/or transfers;A lot of help with  bathing/dressing/bathroom;Assist for transportation;Help with stairs or ramp for entrance   Equipment Recommendations  Other (comment) (Defer to next venue)    Recommendations for Other Services      Precautions / Restrictions Precautions Precautions: Fall;Other (comment) Precaution Comments: bladder/bowel incontinence Restrictions Weight Bearing Restrictions: No Other Position/Activity Restrictions: confirmed WBAT BLE per Dr. Nechama Guard 11/14/21       Mobility Bed Mobility                    Transfers                         Balance                                           ADL either performed or assessed with clinical judgement   ADL Overall ADL's : Needs assistance/impaired                                            Extremity/Trunk Assessment Upper Extremity Assessment Upper Extremity Assessment: Overall WFL for tasks assessed   Lower Extremity Assessment Lower Extremity Assessment: Defer to PT evaluation        Vision   Vision Assessment?: No apparent visual deficits   Perception     Praxis      Cognition Arousal/Alertness: Awake/alert Behavior During Therapy: WFL for tasks assessed/performed Overall Cognitive Status: Within Functional Limits for tasks assessed  General Comments: Tangential at times, but redirectable. limited insight to deficits but motivated to participate        Exercises Exercises: General Upper Extremity General Exercises - Upper Extremity Shoulder Flexion: Strengthening, Both, Theraband (30) Theraband Level (Shoulder Flexion): Level 4 (Blue) Shoulder ABduction: Strengthening, Both, Theraband (30) Theraband Level (Shoulder Abduction): Level 4 (Blue) Shoulder Horizontal ABduction: Strengthening, Both, Theraband (30) Theraband Level (Shoulder Horizontal Abduction): Level 4 (Blue) Elbow Flexion: Strengthening, Both, Theraband  (30) Theraband Level (Elbow Flexion): Level 4 (Blue) Elbow Extension: Strengthening, Both, Theraband (30) Theraband Level (Elbow Extension): Level 4 (Blue)    Shoulder Instructions       General Comments Focus on problem solving EOB tasks safely as pt would like to work on standing more, frustration with confusion in locating new SNF rehab, question if pt will be moved to floor - would open opportunities for OOB tasks, tilt bed trial, etc    Pertinent Vitals/ Pain       Pain Assessment Pain Assessment: No/denies pain  Home Living                                          Prior Functioning/Environment              Frequency  Min 2X/week        Progress Toward Goals  OT Goals(current goals can now be found in the care plan section)  Progress towards OT goals: OT to reassess next treatment  Acute Rehab OT Goals Patient Stated Goal: do whatever i can to get back home safely, find new rehab placement OT Goal Formulation: With patient Time For Goal Achievement: 11/30/21 Potential to Achieve Goals: Good ADL Goals Pt Will Transfer to Toilet: with mod assist;stand pivot transfer;bedside commode Additional ADL Goal #1: pt will complete bed mobility with min G as a precursor to ADLs Additional ADL Goal #2: Pt will stand from EOB with min A to assist in LB ADLs  Plan Discharge plan remains appropriate    Co-evaluation                 AM-PAC OT "6 Clicks" Daily Activity     Outcome Measure   Help from another person eating meals?: None Help from another person taking care of personal grooming?: A Little Help from another person toileting, which includes using toliet, bedpan, or urinal?: Total Help from another person bathing (including washing, rinsing, drying)?: A Lot Help from another person to put on and taking off regular upper body clothing?: A Lot Help from another person to put on and taking off regular lower body clothing?: Total 6 Click  Score: 13    End of Session    OT Visit Diagnosis: Unsteadiness on feet (R26.81);Other abnormalities of gait and mobility (R26.89);Muscle weakness (generalized) (M62.81);Pain   Activity Tolerance Patient tolerated treatment well   Patient Left in bed;with call bell/phone within reach   Nurse Communication          Time: 0850-0920 OT Time Calculation (min): 30 min  Charges: OT General Charges $OT Visit: 1 Visit OT Treatments $Therapeutic Activity: 8-22 mins $Therapeutic Exercise: 8-22 mins  Malachy Chamber, OTR/L Acute Rehab Services Office: [phone]   Layla Maw 11/19/2021, 9:40 AM

## 2021-11-19 NOTE — ED Notes (Signed)
Pt brief changed ,peri care done

## 2021-11-19 NOTE — Progress Notes (Signed)
Physical Therapy Treatment Patient Details Name: Beth Marshall MRN: [id number] DOB: [birth year] Today's Date: 11/19/2021   History of Present Illness Pt is a 56 y.o. female admitted from SNF rehab on 11/12/21 with back pain and x4 month and intermittent L leg pain, weakness, and numbness. Pt has been at [facility] for rehab since June secondary to chronic back pain s/p L tib fib fx (BLE WBAT). PMH includes OA, back pain, hx of DVT, edema, factor V leiden mutation, GERD, anxiety, hypokalemia, insomnia, intrinsic asthma, lipoma, migraine, obesity, tachycardia.    PT Comments    Pt was seen for sitting exercise and core control but requires continual back support to get to do there exercise.  Pt has good intentions but is a two person assist for all mobility to do safely.  Note her standing effort will need to be from a regular bed to accommodate her height and challenges otherwise.  Pt is demonstrating a limited core control of sitting, will need potentially a bariatric stedy or other standing lift to manage her inability to stand up fully.  Recommendations for follow up therapy are one component of a multi-disciplinary discharge planning process, led by the attending physician.  Recommendations may be updated based on patient status, additional functional criteria and insurance authorization.  Follow Up Recommendations  Skilled nursing-short term rehab (<3 hours/day) Can patient physically be transported by private vehicle: No   Assistance Recommended at Discharge Intermittent Supervision/Assistance  Patient can return home with the following Two people to help with walking and/or transfers;A lot of help with bathing/dressing/bathroom;Assistance with cooking/housework;Assist for transportation;Help with stairs or ramp for entrance   Equipment Recommendations  Other (comment);None recommended by PT (SNF to determine needs)    Recommendations for Other Services       Precautions /  Restrictions Precautions Precautions: Fall;Other (comment) Precaution Comments: bladder/bowel incontinence Restrictions Weight Bearing Restrictions: No Other Position/Activity Restrictions: confirmed WBAT BLE per Dr. Nechama Guard 11/14/21     Mobility  Bed Mobility Overal bed mobility: Needs Assistance Bed Mobility: Supine to Sit, Sit to Supine Rolling: Mod assist   Supine to sit: Mod assist Sit to supine: Mod assist        Transfers Overall transfer level: Needs assistance                 General transfer comment: pt cannot scoot well on the bed and cannot pull up to stand from bed set up    Ambulation/Gait                   Stairs             Wheelchair Mobility    Modified Rankin (Stroke Patients Only)       Balance Overall balance assessment: Needs assistance Sitting-balance support: Feet supported, Bilateral upper extremity supported (back support) Sitting balance-[name]: Poor                                      Cognition Arousal/Alertness: Awake/alert Behavior During Therapy: WFL for tasks assessed/performed Overall Cognitive Status: Within Functional Limits for tasks assessed                                 General Comments: pt is limiting herself but also wants to move, very poor core control and struggles to problem solve  Exercises General Exercises - Lower Extremity Ankle Circles/Pumps: AROM, 5 reps Long Arc Quad: AROM, 10 reps Heel Slides: AROM, 10 reps Hip ABduction/ADduction: AROM, 10 reps Hip Flexion/Marching: AROM, 10 reps    General Comments General comments (skin integrity, edema, etc.): ptis quite weak and is going to be a tough rehab placement if she cannot get to stand.  Recommend bari stedy to try to get up from regular hosp bed      Pertinent Vitals/Pain Pain Assessment Pain Assessment: No/denies pain    Home Living                          Prior Function             PT Goals (current goals can now be found in the care plan section) Acute Rehab PT Goals Patient Stated Goal: to go to a new SNF for rehab then home    Frequency    Min 2X/week      PT Plan Current plan remains appropriate    Co-evaluation              AM-PAC PT "6 Clicks" Mobility   Outcome Measure  Help needed turning from your back to your side while in a flat bed without using bedrails?: A Lot Help needed moving from lying on your back to sitting on the side of a flat bed without using bedrails?: Total Help needed moving to and from a bed to a chair (including a wheelchair)?: Total Help needed standing up from a chair using your arms (e.g., wheelchair or bedside chair)?: Total Help needed to walk in hospital room?: Total Help needed climbing 3-5 steps with a railing? : Total 6 Click Score: 7    End of Session   Activity Tolerance: Patient tolerated treatment well Patient left: in bed;with call bell/phone within reach Nurse Communication: Mobility status PT Visit Diagnosis: Muscle weakness (generalized) (M62.81);Difficulty in walking, not elsewhere classified (R26.2)     Time: 2440-1027 PT Time Calculation (min) (ACUTE ONLY): 23 min  Charges:  $Therapeutic Exercise: 8-22 mins $Therapeutic Activity: 8-22 mins        Ramond Dial 11/19/2021, 12:41 PM  Mee Hives, PT PhD Acute Rehab Dept. Number: [place] [facility] [place]

## 2021-11-20 DIAGNOSIS — M545 Low back pain, unspecified: Secondary | ICD-10-CM | POA: Diagnosis not present

## 2021-11-20 LAB — CBC WITH DIFFERENTIAL/PLATELET
Abs Immature Granulocytes: 0.03 10*3/uL (ref 0.00–0.07)
Basophils Absolute: 0 10*3/uL (ref 0.0–0.1)
Basophils Relative: 1 %
Eosinophils Absolute: 0.1 10*3/uL (ref 0.0–0.5)
Eosinophils Relative: 2 %
HCT: 35.9 % — ABNORMAL LOW (ref 36.0–46.0)
Hemoglobin: 11.4 g/dL — ABNORMAL LOW (ref 12.0–15.0)
Immature Granulocytes: 0 %
Lymphocytes Relative: 27 %
Lymphs Abs: 2.3 10*3/uL (ref 0.7–4.0)
MCH: 27.5 pg (ref 26.0–34.0)
MCHC: 31.8 g/dL (ref 30.0–36.0)
MCV: 86.5 fL (ref 80.0–100.0)
Monocytes Absolute: 0.7 10*3/uL (ref 0.1–1.0)
Monocytes Relative: 8 %
Neutro Abs: 5.3 10*3/uL (ref 1.7–7.7)
Neutrophils Relative %: 62 %
Platelets: 237 10*3/uL (ref 150–400)
RBC: 4.15 MIL/uL (ref 3.87–5.11)
RDW: 16.5 % — ABNORMAL HIGH (ref 11.5–15.5)
WBC: 8.5 10*3/uL (ref 4.0–10.5)
nRBC: 0 % (ref 0.0–0.2)

## 2021-11-20 LAB — PROTIME-INR
INR: 2 — ABNORMAL HIGH (ref 0.8–1.2)
Prothrombin Time: 22.3 s — ABNORMAL HIGH (ref 11.4–15.2)

## 2021-11-20 MED ORDER — PNEUMOCOCCAL 20-VAL CONJ VACC 0.5 ML IM SUSY: 0.5000 mL | PREFILLED_SYRINGE | INTRAMUSCULAR | Status: DC

## 2021-11-20 MED ORDER — COVID-19 MRNA 2023-2024 VACCINE (COMIRNATY) 0.3 ML INJECTION
0.3000 mL | Freq: Once | INTRAMUSCULAR | Status: AC
  Administered 2021-11-20: 0.3 mL via INTRAMUSCULAR
  Filled 2021-11-20: qty 0.3

## 2021-11-20 MED ORDER — WARFARIN SODIUM 7.5 MG PO TABS
8.5000 mg | ORAL_TABLET | Freq: Once | ORAL | Status: AC
  Administered 2021-11-20: 8.5 mg via ORAL
  Filled 2021-11-20: qty 1

## 2021-11-20 MED ORDER — PNEUMOCOCCAL 20-VAL CONJ VACC 0.5 ML IM SUSY
0.5000 mL | PREFILLED_SYRINGE | INTRAMUSCULAR | Status: AC
  Administered 2021-11-20: 0.5 mL via INTRAMUSCULAR
  Filled 2021-11-20: qty 0.5

## 2021-11-20 NOTE — ED Provider Notes (Signed)
Emergency Medicine Observation Re-evaluation Note  Beth Marshall is a 56 y.o. female, seen on rounds today.  Pt initially presented to the ED for complaints of Back Pain Currently, the patient is awake and alert.  Physical Exam  BP (!) 124/55 (BP Location: Right Arm)   Pulse (!) 59   Temp 98.3 F (36.8 C) (Oral)   Resp 20   Ht '5\' 6"'$  (1.676 m)   Wt (!) 218.2 kg   SpO2 100%   BMI 77.64 kg/m  Physical Exam General: awake Cardiac: rr Lungs: clear Psych: calm  ED Course / MDM  EKG:   I have reviewed the labs performed to date as well as medications administered while in observation.  Recent changes in the last 24 hours include PT and OT continue to work with patient.  Plan  Current plan is for awaiting placement.    Isla Pence, MD 11/20/21 1321

## 2021-11-20 NOTE — ED Notes (Signed)
Pt cleaned. Linen changed.

## 2021-11-20 NOTE — ED Notes (Signed)
Pt in room A&O x4. Purewick in place. Pt updated on plane of care. Room adjusted for comfort.

## 2021-11-20 NOTE — ED Notes (Signed)
Pt cleaned up. Purewick reapplied. Small BM had

## 2021-11-20 NOTE — ED Notes (Signed)
Received verbal report from Lorren B RN at this time 

## 2021-11-20 NOTE — Progress Notes (Signed)
[place] for Warfarin Indication:  paroxysmal A-fib and recurrent DVT history of factor V Leiden mutation and protein S deficiency  Allergies  Allergen Reactions   Cephalexin Other (See Comments)    Unknown reaction per [facility]    Chocolate Other (See Comments)    Unknown reaction per [facility]   Codeine Nausea And Vomiting   Diphenhydramine Hcl Itching   Ranitidine Rash    Patient Measurements: Height: '5\' 6"'$  (167.6 cm) Weight: (!) 218.2 kg (481 lb) IBW/kg (Calculated) : 59.3 Vital Signs: Temp: 98.3 F (36.8 C) (11/03 0608) Temp Source: Oral (11/03 0608) BP: 124/55 (11/03 0608) Pulse Rate: 59 (11/03 0608)  Labs: Recent Labs    11/18/21 0613 11/20/21 0955  HGB 11.5* 11.4*  HCT 37.5 35.9*  PLT 235 237  LABPROT 22.0* 22.3*  INR 2.0* 2.0*     Estimated Creatinine Clearance: 157.8 mL/min (by C-G formula based on SCr of 0.49 mg/dL).  Assessment: 56 yo F with a history of factor V leiden on warfarin, morbid obesity, bedbound status, recent left tib/fib. Patient presented with back pain, bilateral leg pain. Warfarin per pharmacy consult placed for  paroxysmal A-fib and recurrent DVT history of factor V Leiden mutation and protein S deficiency .  PTA dosing: 8.'5mg'$  daily except for '8mg'$  on Tue/Thur  INR remains therapeutic at 2.0. On the low end of goal but stable. CBC is stable. No bleeding noted  Goal of Therapy:  INR Goal 2-3 Monitor platelets by anticoagulation protocol: Yes   Plan:  Warfarin 8.'5mg'$  PO x 1 today Daily INR CBC every 3 days  Luisa Hart, PharmD, BCPS Clinical Pharmacist 11/20/2021 12:17 PM   Please refer to [facility] for pharmacy phone number

## 2021-11-21 DIAGNOSIS — M545 Low back pain, unspecified: Secondary | ICD-10-CM | POA: Diagnosis not present

## 2021-11-21 LAB — RESP PANEL BY RT-PCR (FLU A&B, COVID) ARPGX2
Influenza A by PCR: NEGATIVE
Influenza B by PCR: NEGATIVE
SARS Coronavirus 2 by RT PCR: NEGATIVE

## 2021-11-21 LAB — PROTIME-INR
INR: 1.9 — ABNORMAL HIGH (ref 0.8–1.2)
Prothrombin Time: 21.7 seconds — ABNORMAL HIGH (ref 11.4–15.2)

## 2021-11-21 MED ORDER — MENTHOL 3 MG MT LOZG
1.0000 | LOZENGE | OROMUCOSAL | Status: DC | PRN
  Administered 2021-12-28 – 2022-02-04 (×3): 3 mg via ORAL
  Filled 2021-11-21 (×5): qty 9

## 2021-11-21 MED ORDER — ENOXAPARIN SODIUM 300 MG/3ML IJ SOLN
1.0000 mg/kg | Freq: Two times a day (BID) | INTRAMUSCULAR | Status: DC
  Administered 2021-11-21 – 2021-11-22 (×2): 220 mg via SUBCUTANEOUS
  Filled 2021-11-21 (×4): qty 2.2

## 2021-11-21 MED ORDER — WARFARIN SODIUM 10 MG PO TABS
10.0000 mg | ORAL_TABLET | Freq: Once | ORAL | Status: AC
  Administered 2021-11-21: 10 mg via ORAL
  Filled 2021-11-21: qty 1

## 2021-11-21 NOTE — Progress Notes (Signed)
CSW sent patient's clinicals to an additional 14 facilities for review due to lack of bed offers.  Madilyn Fireman, MSW, LCSW Transitions of Care  Clinical Social Worker II [phone]

## 2021-11-21 NOTE — Progress Notes (Signed)
[place] for Warfarin Indication:  paroxysmal A-fib and recurrent DVT history of factor V Leiden mutation and protein S deficiency  Allergies  Allergen Reactions   Cephalexin Other (See Comments)    Unknown reaction per [facility]    Chocolate Other (See Comments)    Unknown reaction per [facility]   Codeine Nausea And Vomiting   Diphenhydramine Hcl Itching   Ranitidine Rash    Patient Measurements: Height: '5\' 6"'$  (167.6 cm) Weight: (!) 218.2 kg (481 lb) IBW/kg (Calculated) : 59.3 Vital Signs: Temp: 97.8 F (36.6 C) (11/04 0557) Temp Source: Oral (11/04 0557) BP: 140/62 (11/04 0557) Pulse Rate: 66 (11/04 0557)  Labs: Recent Labs    11/20/21 0955 11/21/21 0559  HGB 11.4*  --   HCT 35.9*  --   PLT 237  --   LABPROT 22.3* 21.7*  INR 2.0* 1.9*     Estimated Creatinine Clearance: 157.8 mL/min (by C-G formula based on SCr of 0.49 mg/dL).  Assessment: 56 yo F with a history of factor V leiden on warfarin, morbid obesity, bedbound status, recent left tib/fib. Patient presented with back pain, bilateral leg pain. Warfarin per pharmacy consult placed for  paroxysmal A-fib and recurrent DVT history of factor V Leiden mutation and protein S deficiency .  PTA dosing: 8.'5mg'$  daily except for '8mg'$  on Tue/Thur  INR today is 1.9 which is sub-therapeutic. CBC ok (not checked this AM - q72h)  Goal of Therapy:  INR Goal 2-3 Monitor platelets by anticoagulation protocol: Yes   Plan:  Give '10mg'$  Warfarin tonight - repeat dosing per INR Will give enoxaparin '1mg'$ /kg ('220mg'$ ) q12h as bridge therapy per discussion with EDP given recurrent DVT hx, and hx of factor V leiden mutation and protein S deficiency --can stop enoxaparin once INR therapeutic --hopefully can discontinue before levels are required  Daily INR CBC every 3 days  Lorelei Pont, PharmD, BCPS 11/21/2021 12:47 PM ED Clinical Pharmacist -  [phone]

## 2021-11-21 NOTE — ED Provider Notes (Signed)
Emergency Medicine Observation Re-evaluation Note  Beth Marshall is a 56 y.o. female, seen on rounds today.  Pt initially presented to the ED for complaints of Back Pain Currently, the patient is awake, complaining of pain behind her ear.  Physical Exam  BP (!) 140/62 (BP Location: Right Arm)   Pulse 66   Temp 97.8 F (36.6 C) (Oral)   Resp 18   Ht '5\' 6"'$  (1.676 m)   Wt (!) 218.2 kg   SpO2 100%   BMI 77.64 kg/m  Physical Exam General: Awake and alert no acute distress Cardiac: Rate and rhythm Lungs: Clear to auscultation bilaterally Psych: Normal mood and affect ENT: Palpable tender lymph node behind left ear, TMs clear bilaterally, normal-appearing bilateral ear canals and no pain with pulling on the pinnae, oropharynx is clear, no sinus tenderness to palpation  ED Course / MDM  EKG:   I have reviewed the labs performed to date as well as medications administered while in observation.  Recent changes in the last 24 hours include working with PT and OT.  Plan  Current plan is for awaiting placement.  Patient will additionally be tested for COVID and flu given her cough and cervical lymphadenopathy and was given Tylenol for pain.Maylon Peppers, Norman Herrlich, DO 11/21/21 1148

## 2021-11-21 NOTE — ED Provider Notes (Deleted)
Emergency Medicine Observation Re-evaluation Note  Beth Marshall is a 56 y.o. female, seen on rounds today.  Pt initially presented to the ED for complaints of Back Pain Currently, the patient is awake, speaking with nursing.  Physical Exam  BP (!) 140/62 (BP Location: Right Arm)   Pulse 66   Temp 97.8 F (36.6 C) (Oral)   Resp 18   Ht '5\' 6"'$  (1.676 m)   Wt (!) 218.2 kg   SpO2 100%   BMI 77.64 kg/m  Physical Exam General: No acute distress Cardiac: regular rate Lungs: Equal chest rise Psych: calm  ED Course / MDM  EKG:   I have reviewed the labs performed to date as well as medications administered while in observation.  Recent changes in the last 24 hours include INR today just barely below target. Will continue to monitor.  Plan  Current plan is for placement in SNF.    Cristie Hem, MD 11/21/21 1310

## 2021-11-21 NOTE — ED Notes (Signed)
Called to room by pt stating that she needed to be cleaned. Located assistance with NT at this time and went to room to clean pt. Pt is able to turn without much assistance. No noted BM and per pt she had wet her chucks. Pt cleaned and changed at this time and adjusted in bed. Pt is also c/o pain to her lt neck just behind her lt ear and states that there is a knot there. Attempted to locate the knot however unable to do so however pt did c/o pain while palpating the area. Dr. Leonette Monarch made aware of same

## 2021-11-22 DIAGNOSIS — M545 Low back pain, unspecified: Secondary | ICD-10-CM | POA: Diagnosis not present

## 2021-11-22 LAB — PROTIME-INR
INR: 2.2 — ABNORMAL HIGH (ref 0.8–1.2)
Prothrombin Time: 23.9 seconds — ABNORMAL HIGH (ref 11.4–15.2)

## 2021-11-22 MED ORDER — WARFARIN SODIUM 6 MG PO TABS
9.0000 mg | ORAL_TABLET | Freq: Once | ORAL | Status: AC
  Administered 2021-11-22: 9 mg via ORAL
  Filled 2021-11-22: qty 1

## 2021-11-22 NOTE — ED Provider Notes (Signed)
Emergency Medicine Observation Re-evaluation Note  Beth Marshall is a 56 y.o. female, seen on rounds today.  Pt initially presented to the ED for complaints of Back Pain Currently, the patient is eating lunch.  Physical Exam  BP (!) 121/56 (BP Location: Right Arm)   Pulse 75   Temp 98 F (36.7 C) (Oral)   Resp 17   Ht '5\' 6"'$  (1.676 m)   Wt (!) 218.2 kg   SpO2 98%   BMI 77.64 kg/m  Physical Exam General: no acute distress Cardiac: regular rate Lungs: clear Psych: calm  ED Course / MDM  EKG:   I have reviewed the labs performed to date as well as medications administered while in observation.  Recent changes in the last 24 hours include tested for flu/COVID, which is negative. INR today at goal  Plan  Current plan is for SNF placement    Cristie Hem, MD 11/22/21 1324

## 2021-11-22 NOTE — ED Notes (Signed)
The pt had a large soft yellow stool   pt cleaned  and linen changed

## 2021-11-22 NOTE — Progress Notes (Addendum)
[place] for Warfarin Indication:  paroxysmal A-fib and recurrent DVT history of factor V Leiden mutation and protein S deficiency  Allergies  Allergen Reactions   Cephalexin Other (See Comments)    Unknown reaction per [facility]    Chocolate Other (See Comments)    Unknown reaction per [facility]   Codeine Nausea And Vomiting   Diphenhydramine Hcl Itching   Ranitidine Rash    Patient Measurements: Height: '5\' 6"'$  (167.6 cm) Weight: (!) 218.2 kg (481 lb) IBW/kg (Calculated) : 59.3 Vital Signs: Temp: 98 F (36.7 C) (11/05 0528) Temp Source: Oral (11/05 0528) BP: 121/56 (11/05 0528) Pulse Rate: 75 (11/05 0528)  Labs: Recent Labs    11/20/21 0955 11/21/21 0559 11/22/21 0500  HGB 11.4*  --   --   HCT 35.9*  --   --   PLT 237  --   --   LABPROT 22.3* 21.7* 23.9*  INR 2.0* 1.9* 2.2*     Estimated Creatinine Clearance: 157.8 mL/min (by C-G formula based on SCr of 0.49 mg/dL).  Assessment: 56 yo F with a history of factor V leiden on warfarin, morbid obesity, bedbound status, recent left tib/fib. Patient presented with back pain, bilateral leg pain. Warfarin per pharmacy consult placed for  paroxysmal A-fib and recurrent DVT history of factor V Leiden mutation and protein S deficiency .  PTA dosing: 8.'5mg'$  daily except for '8mg'$  on Tue/Thur  Initiated on lovenox for subtherapeutic INR and given 10 mg warfarin on 11/4.  INR today is 2.2 which is therapeutic. CBC ok (not checked this AM - q72h)  Goal of Therapy:  INR Goal 2-3 Monitor platelets by anticoagulation protocol: Yes   Plan:  STOP enoxaparin Give 9 mg warfarin tonight - repeat dosing per INR Daily INR CBC every 3 days  Lorelei Pont, PharmD, BCPS 11/22/2021 11:05 AM ED Clinical Pharmacist -  [phone]

## 2021-11-23 DIAGNOSIS — M545 Low back pain, unspecified: Secondary | ICD-10-CM | POA: Diagnosis not present

## 2021-11-23 LAB — CBC WITH DIFFERENTIAL/PLATELET
Abs Immature Granulocytes: 0.02 10*3/uL (ref 0.00–0.07)
Basophils Absolute: 0.1 10*3/uL (ref 0.0–0.1)
Basophils Relative: 1 %
Eosinophils Absolute: 0.1 10*3/uL (ref 0.0–0.5)
Eosinophils Relative: 2 %
HCT: 35.2 % — ABNORMAL LOW (ref 36.0–46.0)
Hemoglobin: 10.9 g/dL — ABNORMAL LOW (ref 12.0–15.0)
Immature Granulocytes: 0 %
Lymphocytes Relative: 27 %
Lymphs Abs: 2.2 10*3/uL (ref 0.7–4.0)
MCH: 26.8 pg (ref 26.0–34.0)
MCHC: 31 g/dL (ref 30.0–36.0)
MCV: 86.5 fL (ref 80.0–100.0)
Monocytes Absolute: 0.8 10*3/uL (ref 0.1–1.0)
Monocytes Relative: 10 %
Neutro Abs: 4.9 10*3/uL (ref 1.7–7.7)
Neutrophils Relative %: 60 %
Platelets: 250 10*3/uL (ref 150–400)
RBC: 4.07 MIL/uL (ref 3.87–5.11)
RDW: 16.4 % — ABNORMAL HIGH (ref 11.5–15.5)
WBC: 8.2 10*3/uL (ref 4.0–10.5)
nRBC: 0 % (ref 0.0–0.2)

## 2021-11-23 LAB — PROTIME-INR
INR: 2 — ABNORMAL HIGH (ref 0.8–1.2)
Prothrombin Time: 22.4 seconds — ABNORMAL HIGH (ref 11.4–15.2)

## 2021-11-23 MED ORDER — MONTELUKAST SODIUM 10 MG PO TABS
10.0000 mg | ORAL_TABLET | Freq: Every day | ORAL | Status: DC
  Administered 2021-11-24 – 2022-02-04 (×73): 10 mg via ORAL
  Filled 2021-11-23 (×73): qty 1

## 2021-11-23 MED ORDER — WARFARIN SODIUM 10 MG PO TABS
10.0000 mg | ORAL_TABLET | Freq: Once | ORAL | Status: AC
  Administered 2021-11-23: 10 mg via ORAL
  Filled 2021-11-23: qty 1

## 2021-11-23 NOTE — ED Notes (Signed)
Lunch tray given. 

## 2021-11-23 NOTE — Progress Notes (Signed)
[place] for Warfarin Indication:  paroxysmal A-fib and recurrent DVT history of factor V Leiden mutation and protein S deficiency  Allergies  Allergen Reactions   Cephalexin Other (See Comments)    Unknown reaction per [facility]    Chocolate Other (See Comments)    Unknown reaction per [facility]   Codeine Nausea And Vomiting   Diphenhydramine Hcl Itching   Ranitidine Rash    Patient Measurements: Height: '5\' 6"'$  (167.6 cm) Weight: (!) 218.2 kg (481 lb) IBW/kg (Calculated) : 59.3 Vital Signs: Temp: 98.1 F (36.7 C) (11/06 0616) Temp Source: Oral (11/06 0616) BP: 116/61 (11/06 0616) Pulse Rate: 60 (11/06 0616)  Labs: Recent Labs    11/20/21 0955 11/21/21 0559 11/22/21 0500 11/23/21 0628  HGB 11.4*  --   --  10.9*  HCT 35.9*  --   --  35.2*  PLT 237  --   --  250  LABPROT 22.3* 21.7* 23.9* 22.4*  INR 2.0* 1.9* 2.2* 2.0*     Estimated Creatinine Clearance: 157.8 mL/min (by C-G formula based on SCr of 0.49 mg/dL).  Assessment: 56 yo F with a history of factor V leiden on warfarin, morbid obesity, bedbound status, recent left tib/fib. Patient presented with back pain, bilateral leg pain. Warfarin per pharmacy consult placed for  paroxysmal A-fib and recurrent DVT history of factor V Leiden mutation and protein S deficiency .  PTA dosing: 8.'5mg'$  daily except for '8mg'$  on Tue/Thur  INR 2.0 and CBC is stable and WNL. No signs of bleeding  Goal of Therapy:  INR Goal 2-3 Monitor platelets by anticoagulation protocol: Yes   Plan:  Warfarin '10mg'$  PO once at 1600 Daily INR CBC every 3 days  Erskine Speed, PharmD Clinical Pharmacist 11/23/2021 8:15 AM

## 2021-11-23 NOTE — ED Notes (Signed)
Appetite good.

## 2021-11-23 NOTE — Progress Notes (Addendum)
11:55am: CSW spoke with [facility] who requested clinicals be sent for review.  11am: CSW sent secure e-mail to [facility] leadership requesting assistance on this patient.  Madilyn Fireman, MSW, LCSW Transitions of Care  Clinical Social Worker II [phone]

## 2021-11-24 DIAGNOSIS — M545 Low back pain, unspecified: Secondary | ICD-10-CM | POA: Diagnosis not present

## 2021-11-24 LAB — PROTIME-INR
INR: 2 — ABNORMAL HIGH (ref 0.8–1.2)
Prothrombin Time: 22.7 seconds — ABNORMAL HIGH (ref 11.4–15.2)

## 2021-11-24 MED ORDER — WARFARIN SODIUM 10 MG PO TABS
10.0000 mg | ORAL_TABLET | Freq: Once | ORAL | Status: AC
  Administered 2021-11-24: 10 mg via ORAL
  Filled 2021-11-24 (×3): qty 1

## 2021-11-24 NOTE — ED Notes (Signed)
Pt eating dinner

## 2021-11-24 NOTE — ED Notes (Addendum)
Pt cleaned and pads changed. Barrier cream applied.

## 2021-11-24 NOTE — ED Notes (Signed)
Pt resting quietly with eyes closed. No acute distress. Breakfast tray delivered.

## 2021-11-24 NOTE — Progress Notes (Signed)
[place] for Warfarin Indication:  paroxysmal A-fib and recurrent DVT history of factor V Leiden mutation and protein S deficiency  Allergies  Allergen Reactions   Cephalexin Other (See Comments)    Unknown reaction per [facility]    Chocolate Other (See Comments)    Unknown reaction per [facility]   Codeine Nausea And Vomiting   Diphenhydramine Hcl Itching   Ranitidine Rash    Patient Measurements: Height: '5\' 6"'$  (167.6 cm) Weight: (!) 218.2 kg (481 lb) IBW/kg (Calculated) : 59.3 Vital Signs: Temp: 97.7 F (36.5 C) (11/07 0602) Temp Source: Oral (11/07 0602) BP: 131/61 (11/07 0602) Pulse Rate: 75 (11/07 0602)  Labs: Recent Labs    11/22/21 0500 11/23/21 0628 11/24/21 0832  HGB  --  10.9*  --   HCT  --  35.2*  --   PLT  --  250  --   LABPROT 23.9* 22.4* 22.7*  INR 2.2* 2.0* 2.0*     Estimated Creatinine Clearance: 157.8 mL/min (by C-G formula based on SCr of 0.49 mg/dL).  Assessment: 56 yo F with a history of factor V leiden on warfarin, morbid obesity, bedbound status, recent left tib/fib. Patient presented with back pain, bilateral leg pain. Warfarin per pharmacy consult placed for  paroxysmal A-fib and recurrent DVT history of factor V Leiden mutation and protein S deficiency .  PTA dosing: 8.'5mg'$  daily except for '8mg'$  on Tue/Thur  INR 2.0 and CBC is stable and WNL. No signs of bleeding  Goal of Therapy:  INR Goal 2-3 Monitor platelets by anticoagulation protocol: Yes   Plan:  Warfarin '10mg'$  PO once at 1600 Daily INR CBC every 3 days  Erskine Speed, PharmD Clinical Pharmacist 11/24/2021 9:50 AM

## 2021-11-24 NOTE — Progress Notes (Addendum)
1:30pm: CSW spoke with Rip Harbour at [facility] in [place] who states the facility is unable to accommodate this patient's needs.  CSW spoke Missy with [facility] who states she is agreeable to review referral. CSW sent secure e-mail with clinicals attached for review.  8:20am: CSW received message from [place] at [facility] in [place] who states she cannot accept the patient due to the following reasons: patient requires special equipment, patient owes previous facility money, and she left AMA.  CSW spoke with Narda Rutherford at [facility] who states the DON is still reviewing patient's clinicals at this time.  Madilyn Fireman, MSW, LCSW Transitions of Care  Clinical Social Worker II [phone]

## 2021-11-24 NOTE — Progress Notes (Signed)
Physical Therapy Treatment Patient Details Name: Beth Marshall MRN: [id number] DOB: 08/12/1965 Today's Date: 11/24/2021   History of Present Illness Pt is a 56 y.o. female admitted from SNF rehab on 11/12/21 with back pain and x4 month and intermittent L leg pain, weakness, and numbness. Pt has been at [facility] for rehab since June secondary to chronic back pain s/p L tib fib fx (BLE WBAT). PMH includes OA, back pain, hx of DVT, edema, factor V leiden mutation, GERD, anxiety, hypokalemia, insomnia, intrinsic asthma, lipoma, migraine, obesity, tachycardia.    PT Comments    Pt making some progress with bed mobility and nearly able to get feet to floor with bed deflated but then her buttock pain increased and she was unable to scoot further forward and she requested to lay down. Performed scooting in bed with feet blocked and pushing into legs as well as bridging for strengthening.  Remains limited due to tall bariatric bed, small ED room, and pain.  Ideally would benefit from tilt bed but limited in ED.  Pt requesting RW to attempt standing next visit - if able to get scooted forward could possibly attempt with bariatric RW.  Did increase frequency of therapy due to pt is DTP and limited progress with 2 x week.     Recommendations for follow up therapy are one component of a multi-disciplinary discharge planning process, led by the attending physician.  Recommendations may be updated based on patient status, additional functional criteria and insurance authorization.  Follow Up Recommendations  Skilled nursing-short term rehab (<3 hours/day) Can patient physically be transported by private vehicle: No   Assistance Recommended at Discharge Frequent or constant Supervision/Assistance  Patient can return home with the following Two people to help with walking and/or transfers;A lot of help with bathing/dressing/bathroom;Assistance with cooking/housework;Assist for transportation;Help with  stairs or ramp for entrance   Equipment Recommendations  Other (comment) (defer to SNF)    Recommendations for Other Services       Precautions / Restrictions Precautions Precautions: Fall;Other (comment) Precaution Comments: bladder/bowel incontinence Restrictions Weight Bearing Restrictions: No Other Position/Activity Restrictions: confirmed WBAT BLE per Dr. Nechama Guard 11/14/21     Mobility  Bed Mobility Overal bed mobility: Needs Assistance Bed Mobility: Supine to Sit, Sit to Supine     Supine to sit: Min assist, HOB elevated Sit to supine: Mod assist   General bed mobility comments: light  handheld assist to lift trunk fully to EOB, good effort with use of bedrails. Mod A to get BLE back into bed; cues for weight shifting and scooting forward    Transfers                   General transfer comment: Pt cued to scoot further forward and could nearly get feet to ground with bed deflated but then her buttock was hurting and stating she needed to lay down.    Ambulation/Gait                   Stairs             Wheelchair Mobility    Modified Rankin (Stroke Patients Only)       Balance Overall balance assessment: Needs assistance Sitting-balance support: Feet supported, Bilateral upper extremity supported Sitting balance-[name]: Poor Sitting balance - Comments: varied from reliance on UE support EOB w/ deflating mattress, able to briefly sit without heavy reliance on UE       Standing balance comment: unable to  safely attempt today and limited by pain                            Cognition Arousal/Alertness: Lethargic Behavior During Therapy: WFL for tasks assessed/performed Overall Cognitive Status: Within Functional Limits for tasks assessed                                 General Comments: self limiting at times; lethargic - reports taken medication for migraine that makes her lethargic         Exercises Other Exercises Other Exercises: Low bridging for pad placement - lifted x 3; Pushing up in bed with use of extremities and feet blocked x 5 pushes.  Encouraged pt to perform bridging on her own and when being pulled up in bed have them block her feet and her push up.  Since not able to stand these are best ways to strengthen/simulate standing.    General Comments        Pertinent Vitals/Pain Pain Assessment Pain Assessment: Faces Faces Pain Scale: Hurts little more Pain Descriptors / Indicators: Discomfort, Sore Pain Intervention(s): Limited activity within patient's tolerance, Monitored during session, Repositioned    Home Living                          Prior Function            PT Goals (current goals can now be found in the care plan section) Progress towards PT goals: Progressing toward goals    Frequency    Min 3X/week      PT Plan Frequency needs to be updated (DTP increased frequency to try to progress)    Co-evaluation PT/OT/SLP Co-Evaluation/Treatment: Yes Reason for Co-Treatment: Complexity of the patient's impairments (multi-system involvement)   OT goals addressed during session: ADL's and self-care;Strengthening/ROM      AM-PAC PT "6 Clicks" Mobility   Outcome Measure  Help needed turning from your back to your side while in a flat bed without using bedrails?: A Little Help needed moving from lying on your back to sitting on the side of a flat bed without using bedrails?: A Little Help needed moving to and from a bed to a chair (including a wheelchair)?: Total Help needed standing up from a chair using your arms (e.g., wheelchair or bedside chair)?: Total Help needed to walk in hospital room?: Total Help needed climbing 3-5 steps with a railing? : Total 6 Click Score: 10    End of Session   Activity Tolerance: Patient limited by pain Patient left: in bed;with call bell/phone within reach Nurse Communication: Mobility  status PT Visit Diagnosis: Muscle weakness (generalized) (M62.81);Difficulty in walking, not elsewhere classified (R26.2)     Time: 9163-8466 PT Time Calculation (min) (ACUTE ONLY): 23 min  Charges:  $Therapeutic Activity: 8-22 mins                     Abran Richard, PT Acute Rehab [facility] [phone]    Karlton Lemon 11/24/2021, 12:36 PM

## 2021-11-24 NOTE — ED Notes (Signed)
Awaiting med from pharmacy

## 2021-11-24 NOTE — ED Notes (Signed)
Family at bedside. Pt and family playing cards.

## 2021-11-24 NOTE — Progress Notes (Signed)
Occupational Therapy Treatment Patient Details Name: Beth Marshall MRN: [id number] DOB: 12-Feb-1965 Today's Date: 11/24/2021   History of present illness Pt is a 56 y.o. female admitted from SNF rehab on 11/12/21 with back pain and x4 month and intermittent L leg pain, weakness, and numbness. Pt has been at [facility] for rehab since June secondary to chronic back pain s/p L tib fib fx (BLE WBAT). PMH includes OA, back pain, hx of DVT, edema, factor V leiden mutation, GERD, anxiety, hypokalemia, insomnia, intrinsic asthma, lipoma, migraine, obesity, tachycardia.   OT comments  Pt with slower progress towards goals, limited by headache and some self limiting behaviors though agreeable to attempt EOB in coordination with PT/OT. Further problem solving continued in managing progression safely with current room/equipment setup. Pt able to sit EOB with Min A (bed deflated during mobility) and feel pt could safely touch feet to floor today. However, once sitting EOB < 30 sec, pt reports bottom sore and requested to return to supine. Plan to trial Sizewise bari walker in next session in coordination with PT though if unable to progress w/ current bed/room, will likely reduce OT frequency.    Recommendations for follow up therapy are one component of a multi-disciplinary discharge planning process, led by the attending physician.  Recommendations may be updated based on patient status, additional functional criteria and insurance authorization.    Follow Up Recommendations  Skilled nursing-short term rehab (<3 hours/day)    Assistance Recommended at Discharge Frequent or constant Supervision/Assistance  Patient can return home with the following  Two people to help with walking and/or transfers;Two people to help with bathing/dressing/bathroom   Equipment Recommendations  Hospital bed;Wheelchair cushion (measurements OT);Wheelchair (measurements OT);Other (comment) (hoyer lift)     Recommendations for Other Services      Precautions / Restrictions Precautions Precautions: Fall;Other (comment) Precaution Comments: bladder/bowel incontinence Restrictions Weight Bearing Restrictions: No Other Position/Activity Restrictions: confirmed WBAT BLE per Dr. Nechama Guard 11/14/21       Mobility Bed Mobility Overal bed mobility: Needs Assistance Bed Mobility: Supine to Sit, Sit to Supine     Supine to sit: Min assist, HOB elevated Sit to supine: Mod assist   General bed mobility comments: light  handheld assist to lift trunk fully to EOB, good effort with use of bedrails. Mod A to get BLE back into bed    Transfers                         Balance Overall balance assessment: Needs assistance Sitting-balance support: Feet supported, Bilateral upper extremity supported Sitting balance-[name]: Poor Sitting balance - Comments: varied from reliance on UE support EOB w/ deflating mattress, able to briefly sit without heavy reliance on UE                                   ADL either performed or assessed with clinical judgement   ADL Overall ADL's : Needs assistance/impaired                             Toileting- Clothing Manipulation and Hygiene: Total assistance Toileting - Clothing Manipulation Details (indicate cue type and reason): use of purewick bedlevel       General ADL Comments: Emphasis on trial of EOB, further problem solving in attempts to progress pt safely in ED.    Extremity/Trunk  Assessment Upper Extremity Assessment Upper Extremity Assessment: Overall WFL for tasks assessed   Lower Extremity Assessment Lower Extremity Assessment: Defer to PT evaluation        Vision   Vision Assessment?: No apparent visual deficits   Perception     Praxis      Cognition Arousal/Alertness: Awake/alert Behavior During Therapy: WFL for tasks assessed/performed Overall Cognitive Status: Within Functional Limits  for tasks assessed                                 General Comments: self limiting at times, some decreased awareness of gravity of situation. follows one step commands, somewhat resistant to input/feedback at times        Exercises      Shoulder Instructions       General Comments      Pertinent Vitals/ Pain       Pain Assessment Pain Assessment: Faces Faces Pain Scale: Hurts a little bit Pain Location: bottom sitting EOB  Home Living                                          Prior Functioning/Environment              Frequency  Min 2X/week        Progress Toward Goals  OT Goals(current goals can now be found in the care plan section)  Progress towards OT goals: OT to reassess next treatment  Acute Rehab OT Goals Patient Stated Goal: be able to work with therapy, stand OT Goal Formulation: With patient Time For Goal Achievement: 11/30/21 Potential to Achieve Goals: Good ADL Goals Pt Will Transfer to Toilet: with mod assist;with +2 assist;stand pivot transfer;squat pivot transfer;bedside commode Additional ADL Goal #1: pt will complete bed mobility with min G as a precursor to ADLs Additional ADL Goal #2: Pt to stand from EOB with Mod A x 2 to assist with LB ADLs  Plan Discharge plan remains appropriate    Co-evaluation    PT/OT/SLP Co-Evaluation/Treatment: Yes Reason for Co-Treatment: For patient/therapist safety;To address functional/ADL transfers   OT goals addressed during session: ADL's and self-care;Strengthening/ROM      AM-PAC OT "6 Clicks" Daily Activity     Outcome Measure   Help from another person eating meals?: None Help from another person taking care of personal grooming?: A Little Help from another person toileting, which includes using toliet, bedpan, or urinal?: Total Help from another person bathing (including washing, rinsing, drying)?: A Lot Help from another person to put on and taking off  regular upper body clothing?: A Little Help from another person to put on and taking off regular lower body clothing?: Total 6 Click Score: 14    End of Session    OT Visit Diagnosis: Unsteadiness on feet (R26.81);Other abnormalities of gait and mobility (R26.89);Muscle weakness (generalized) (M62.81);Pain   Activity Tolerance Patient tolerated treatment well   Patient Left in bed;with call bell/phone within reach   Nurse Communication Mobility status        Time: 9357-0177 OT Time Calculation (min): 23 min  Charges: OT General Charges $OT Visit: 1 Visit OT Treatments $Therapeutic Activity: 8-22 mins  Malachy Chamber, OTR/L Acute Rehab Services Office: [phone]   Layla Maw 11/24/2021, 10:32 AM

## 2021-11-24 NOTE — ED Notes (Signed)
Pad changed and pur wick cannister changed.

## 2021-11-24 NOTE — ED Notes (Signed)
Lunch tray provided. 

## 2021-11-24 NOTE — ED Notes (Signed)
Pt chux changed, peri care performed with barrier cream applied, new purewick in place.  No further needs expressed at this time.

## 2021-11-25 DIAGNOSIS — M545 Low back pain, unspecified: Secondary | ICD-10-CM | POA: Diagnosis not present

## 2021-11-25 LAB — PROTIME-INR
INR: 2 — ABNORMAL HIGH (ref 0.8–1.2)
Prothrombin Time: 22.6 seconds — ABNORMAL HIGH (ref 11.4–15.2)

## 2021-11-25 MED ORDER — WARFARIN SODIUM 6 MG PO TABS
12.0000 mg | ORAL_TABLET | Freq: Once | ORAL | Status: AC
  Administered 2021-11-25: 12 mg via ORAL
  Filled 2021-11-25: qty 2

## 2021-11-25 MED ORDER — ZINC OXIDE 12.8 % EX OINT
TOPICAL_OINTMENT | CUTANEOUS | Status: AC | PRN
  Filled 2021-11-25: qty 56.7

## 2021-11-25 NOTE — ED Notes (Signed)
Pt given peri care  . Small skin irritation located on Lt buttocks covered with Mepilex and small skin irritation on Lt thigh covered with mepilex . Zinc ordered for Pt as requested , Pt reported zinc was  used at [facility] for skin irritation due to urinary inconte

## 2021-11-25 NOTE — Progress Notes (Signed)
CSW spoke with patient's mother Inez Catalina to provide her with updates regarding placement efforts.   CSW attempted to reach admissions at [facility] in [place] without success - no voicemail option available.  Madilyn Fireman, MSW, LCSW Transitions of Care  Clinical Social Worker II [phone]

## 2021-11-25 NOTE — Progress Notes (Signed)
ANTICOAGULATION CONSULT NOTE  Pharmacy Consult for Warfarin Indication:  paroxysmal A-fib and recurrent DVT history of factor V Leiden mutation and protein S deficiency  Allergies  Allergen Reactions   Cephalexin Other (See Comments)    Unknown reaction per [facility]    Chocolate Other (See Comments)    Unknown reaction per [facility]   Codeine Nausea And Vomiting   Diphenhydramine Hcl Itching   Ranitidine Rash    Patient Measurements: Height: '5\' 6"'$  (167.6 cm) Weight: (!) 218.2 kg (481 lb) IBW/kg (Calculated) : 59.3 Vital Signs:    Labs: Recent Labs    11/23/21 0628 11/24/21 0832 11/25/21 0540  HGB 10.9*  --   --   HCT 35.2*  --   --   PLT 250  --   --   LABPROT 22.4* 22.7* 22.6*  INR 2.0* 2.0* 2.0*     Estimated Creatinine Clearance: 157.8 mL/min (by C-G formula based on SCr of 0.49 mg/dL).  Assessment: 56 yo F with a history of factor V leiden on warfarin, morbid obesity, bedbound status, recent left tib/fib. Patient presented with back pain, bilateral leg pain. Warfarin per pharmacy consult placed for  paroxysmal A-fib and recurrent DVT history of factor V Leiden mutation and protein S deficiency .  PTA dosing: 8.'5mg'$  daily except for '8mg'$  on Tue/Thur  INR remains 2.0 and CBC is stable and WNL. No signs of bleeding  Goal of Therapy:  INR Goal 2-3 Monitor platelets by anticoagulation protocol: Yes   Plan:  Warfarin '12mg'$  PO once at 1600 Daily INR CBC every 3 days  Patient has been needing higher doses compared to home regimen and will likely need increased dose on discharge with close follow-up. Pharmacy will continue to follow for discharge recommendations.  Erskine Speed, PharmD Clinical Pharmacist 11/25/2021 8:15 AM

## 2021-11-25 NOTE — ED Notes (Signed)
[facility] at [facility] [phone], [place]- [phone]

## 2021-11-26 DIAGNOSIS — M545 Low back pain, unspecified: Secondary | ICD-10-CM | POA: Diagnosis not present

## 2021-11-26 LAB — PROTIME-INR
INR: 2.2 — ABNORMAL HIGH (ref 0.8–1.2)
Prothrombin Time: 24.3 seconds — ABNORMAL HIGH (ref 11.4–15.2)

## 2021-11-26 MED ORDER — ONDANSETRON 4 MG PO TBDP
4.0000 mg | ORAL_TABLET | Freq: Once | ORAL | Status: AC
  Administered 2021-11-26: 4 mg via ORAL
  Filled 2021-11-26: qty 1

## 2021-11-26 MED ORDER — WARFARIN SODIUM 10 MG PO TABS
10.0000 mg | ORAL_TABLET | Freq: Once | ORAL | Status: AC
  Administered 2021-11-26: 10 mg via ORAL
  Filled 2021-11-26 (×2): qty 1

## 2021-11-26 MED ORDER — SUMATRIPTAN SUCCINATE 100 MG PO TABS
100.0000 mg | ORAL_TABLET | ORAL | Status: AC | PRN
  Administered 2021-11-26 – 2021-12-19 (×2): 100 mg via ORAL
  Filled 2021-11-26 (×3): qty 1

## 2021-11-26 NOTE — Progress Notes (Signed)
[place] for Warfarin Indication:  paroxysmal A-fib and recurrent DVT history of factor V Leiden mutation and protein S deficiency  Allergies  Allergen Reactions   Cephalexin Other (See Comments)    Unknown reaction per [facility]    Chocolate Other (See Comments)    Unknown reaction per [facility]   Codeine Nausea And Vomiting   Diphenhydramine Hcl Itching   Ranitidine Rash    Patient Measurements: Height: '5\' 6"'$  (167.6 cm) Weight: (!) 218.2 kg (481 lb) IBW/kg (Calculated) : 59.3 Vital Signs:    Labs: Recent Labs    11/24/21 0832 11/25/21 0540 11/26/21 1021  LABPROT 22.7* 22.6* 24.3*  INR 2.0* 2.0* 2.2*     Estimated Creatinine Clearance: 157.8 mL/min (by C-G formula based on SCr of 0.49 mg/dL).  Assessment: 56 yo F with a history of factor V leiden on warfarin, morbid obesity, bedbound status, recent left tib/fib. Patient presented with back pain, bilateral leg pain. Warfarin per pharmacy consult placed for  paroxysmal A-fib and recurrent DVT history of factor V Leiden mutation and protein S deficiency .  PTA dosing: 8.'5mg'$  daily except for '8mg'$  on Tue/Thur  INR remains 2.2 with no signs of bleeding  Goal of Therapy:  INR Goal 2-3 Monitor platelets by anticoagulation protocol: Yes   Plan:  Warfarin '10mg'$  PO once at 1600 Daily INR CBC every 3 days - ordered for 11/10 as has not been collected recently  Patient has been needing higher doses compared to home regimen and will likely need increased dose on discharge with close follow-up. Pharmacy will continue to follow for discharge recommendations.  Erskine Speed, PharmD Clinical Pharmacist 11/26/2021 11:40 AM

## 2021-11-26 NOTE — Progress Notes (Signed)
PT Cancellation Note  Patient Details Name: Beth Marshall MRN: [id number] DOB: 10/13/1965   Cancelled Treatment:    Reason Eval/Treat Not Completed: Pain limiting ability to participate; reports has a migraine and despite meds will have to "sleep it off".  Will return another day.   Reginia Naas 11/26/2021, 2:29 PM Magda Kiel, PT Acute Rehabilitation Services Office:[phone] 11/26/2021

## 2021-11-26 NOTE — ED Notes (Signed)
Empted patient suction canister changed patient pad on bed placed another one patient is resting with call bell in reach

## 2021-11-26 NOTE — Progress Notes (Signed)
OT Cancellation Note  Patient Details Name: Beth Marshall MRN: [id number] DOB: [birth year]   Cancelled Treatment:    Reason Eval/Treat Not Completed: Patient declined, no reason specified Pt reports feeling some better from migraine though still declined to work with OT today. Educated on prior plan to trial bariatric walker for standing w/ pt reporting she "may try later". Will follow up tomorrow as scheduling permits.   Layla Maw 11/26/2021, 1:41 PM

## 2021-11-26 NOTE — Progress Notes (Signed)
CSW spoke with Beth Marshall at [facility] in [place] who states the facility cannot accommodate the patient due to her age.  Madilyn Fireman, MSW, LCSW Transitions of Care  Clinical Social Worker II [phone]

## 2021-11-26 NOTE — ED Notes (Signed)
PT reports having a migrain HA

## 2021-11-27 DIAGNOSIS — M545 Low back pain, unspecified: Secondary | ICD-10-CM | POA: Diagnosis not present

## 2021-11-27 LAB — CBC
HCT: 38.5 % (ref 36.0–46.0)
Hemoglobin: 11.5 g/dL — ABNORMAL LOW (ref 12.0–15.0)
MCH: 26.6 pg (ref 26.0–34.0)
MCHC: 29.9 g/dL — ABNORMAL LOW (ref 30.0–36.0)
MCV: 89.1 fL (ref 80.0–100.0)
Platelets: 262 10*3/uL (ref 150–400)
RBC: 4.32 MIL/uL (ref 3.87–5.11)
RDW: 16.3 % — ABNORMAL HIGH (ref 11.5–15.5)
WBC: 8.7 10*3/uL (ref 4.0–10.5)
nRBC: 0 % (ref 0.0–0.2)

## 2021-11-27 LAB — PROTIME-INR
INR: 2.2 — ABNORMAL HIGH (ref 0.8–1.2)
Prothrombin Time: 24 seconds — ABNORMAL HIGH (ref 11.4–15.2)

## 2021-11-27 MED ORDER — WARFARIN SODIUM 10 MG PO TABS
10.0000 mg | ORAL_TABLET | Freq: Once | ORAL | Status: AC
  Administered 2021-11-27: 10 mg via ORAL
  Filled 2021-11-27 (×3): qty 1

## 2021-11-27 NOTE — Progress Notes (Signed)
Physical Therapy Treatment Patient Details Name: Beth Marshall MRN: [id number] DOB: February 09, 1965 Today's Date: 11/27/2021   History of Present Illness Pt is a 56 y.o. female admitted from SNF rehab on 11/12/21 with back pain and x4 month and intermittent L leg pain, weakness, and numbness. Pt has been at [facility] for rehab since June secondary to chronic back pain s/p L tib fib fx (BLE WBAT). PMH includes OA, back pain, hx of DVT, edema, factor V leiden mutation, GERD, anxiety, hypokalemia, insomnia, intrinsic asthma, lipoma, migraine, obesity, tachycardia.    PT Comments    Pt making excellent progress today and was able to stand at EOB with mod A of 2 and bariatric RW (bed deflated and pt scooting forward was able to get feet on floor).  Pt additionally participating in several strengthening exercises with cues for slow controlled motion.  Tolerated therapy well today. Continue to progress as able.     Recommendations for follow up therapy are one component of a multi-disciplinary discharge planning process, led by the attending physician.  Recommendations may be updated based on patient status, additional functional criteria and insurance authorization.  Follow Up Recommendations  Skilled nursing-short term rehab (<3 hours/day) Can patient physically be transported by private vehicle: No   Assistance Recommended at Discharge Frequent or constant Supervision/Assistance  Patient can return home with the following Two people to help with walking and/or transfers;A lot of help with bathing/dressing/bathroom;Assistance with cooking/housework;Assist for transportation;Help with stairs or ramp for entrance   Equipment Recommendations  Other (comment) (defer to SNF)    Recommendations for Other Services       Precautions / Restrictions Precautions Precautions: Fall Restrictions Other Position/Activity Restrictions: confirmed WBAT BLE per Dr. Nechama Guard 11/14/21     Mobility  Bed  Mobility Overal bed mobility: Needs Assistance Bed Mobility: Supine to Sit, Sit to Supine     Supine to sit: Min assist, HOB elevated Sit to supine: Min assist   General bed mobility comments: light  handheld assist to lift trunk fully to EOB, good effort with use of bedrails. Min A to get BLE back into bed    Transfers Overall transfer level: Needs assistance Equipment used: Rolling walker (2 wheels) Transfers: Sit to/from Stand Sit to Stand: Mod assist, +2 physical assistance          Lateral/Scoot Transfers: Mod assist General transfer comment: Pt was able to get feet on floor today with air mattress deflated.  Used bariatric RW.  Pt pulling up on bariatric RW (used to using parallel bar) with tech stabilizing RW and PT assisting to stand.  Required 3 attempts but good effort from pt and maintained 15 seconds.  Pt fatigued and did not want to attempt again after resting.  She did do 2 good lateral scoots to right at EOB with mod A using sheet and cues.    Ambulation/Gait               General Gait Details: unable   Stairs             Wheelchair Mobility    Modified Rankin (Stroke Patients Only)       Balance Overall balance assessment: Needs assistance Sitting-balance support: Feet supported, No upper extremity supported Sitting balance-[name]: Fair Sitting balance - Comments: Able to sit EOB without UE support   Standing balance support: Bilateral upper extremity supported Standing balance-[name]: Poor Standing balance comment: Able to stand for 15 seconds.  Once standing needing RW and  min guard                            Cognition Arousal/Alertness: Awake/alert Behavior During Therapy: WFL for tasks assessed/performed Overall Cognitive Status: Within Functional Limits for tasks assessed                                 General Comments: axnious about standing but willing to attempt        Exercises General  Exercises - Lower Extremity Ankle Circles/Pumps: AROM, 10 reps, Supine, Both Quad Sets: AROM, Both, 10 reps, Supine Long Arc Quad: AROM, Both, 20 reps, Seated (with min manual resistance) Heel Slides: AROM, 10 reps, Both, Supine Hip ABduction/ADduction: AROM, 10 reps, Both, Supine Straight Leg Raises: AROM, Both, 10 reps, Supine Hip Flexion/Marching: AROM, 10 reps, Both, Seated Other Exercises Other Exercises: Low bridging x10 feet blocked; Pushing up in bed with use of extremities and feet blocked x 5 pushes. Other Exercises: Also did hip add pillow squeeze sitting x 10, tricep pushdowns at EOB (isometric, not clearing buttock)x10 Other Exercises: All with cues for slow controlled motion    General Comments        Pertinent Vitals/Pain Pain Assessment Pain Assessment: 0-10 Pain Score: 5  Pain Location: L knee Pain Descriptors / Indicators: Discomfort, Sore Pain Intervention(s): Limited activity within patient's tolerance, Monitored during session    Home Living                          Prior Function            PT Goals (current goals can now be found in the care plan section) Acute Rehab PT Goals Time For Goal Achievement: 12/11/21 Progress towards PT goals: Progressing toward goals    Frequency    Min 3X/week      PT Plan Current plan remains appropriate    Co-evaluation              AM-PAC PT "6 Clicks" Mobility   Outcome Measure  Help needed turning from your back to your side while in a flat bed without using bedrails?: A Little Help needed moving from lying on your back to sitting on the side of a flat bed without using bedrails?: A Little Help needed moving to and from a bed to a chair (including a wheelchair)?: Total Help needed standing up from a chair using your arms (e.g., wheelchair or bedside chair)?: Total Help needed to walk in hospital room?: Total Help needed climbing 3-5 steps with a railing? : Total 6 Click Score: 10     End of Session Equipment Utilized During Treatment: Gait belt Activity Tolerance: Patient tolerated treatment well Patient left: in bed;with call bell/phone within reach Nurse Communication: Mobility status PT Visit Diagnosis: Muscle weakness (generalized) (M62.81);Difficulty in walking, not elsewhere classified (R26.2)     Time: 4098-1191 PT Time Calculation (min) (ACUTE ONLY): 28 min  Charges:  $Therapeutic Exercise: 8-22 mins $Therapeutic Activity: 8-22 mins                     Abran Richard, PT Acute Rehab [facility] Rehab [phone]    Karlton Lemon 11/27/2021, 4:26 PM

## 2021-11-27 NOTE — Progress Notes (Signed)
[facility] for Warfarin Indication:  paroxysmal A-fib and recurrent DVT history of factor V Leiden mutation and protein S deficiency  Allergies  Allergen Reactions   Cephalexin Other (See Comments)    Unknown reaction per [facility]    Chocolate Other (See Comments)    Unknown reaction per [facility]   Codeine Nausea And Vomiting   Diphenhydramine Hcl Itching   Ranitidine Rash    Patient Measurements: Height: '5\' 6"'$  (167.6 cm) Weight: (!) 218.2 kg (481 lb) IBW/kg (Calculated) : 59.3 Vital Signs: Temp: 98.1 F (36.7 C) (11/10 0600) BP: 106/60 (11/10 0600) Pulse Rate: 67 (11/10 0600)  Labs: Recent Labs    11/25/21 0540 11/26/21 1021 11/27/21 0500  HGB  --   --  11.5*  HCT  --   --  38.5  PLT  --   --  262  LABPROT 22.6* 24.3* 24.0*  INR 2.0* 2.2* 2.2*     Estimated Creatinine Clearance: 157.8 mL/min (by C-G formula based on SCr of 0.49 mg/dL).  Assessment: 56 yo F with a history of factor V leiden on warfarin, morbid obesity, bedbound status, recent left tib/fib. Patient presented with back pain, bilateral leg pain. Warfarin per pharmacy consult placed for  paroxysmal A-fib and recurrent DVT history of factor V Leiden mutation and protein S deficiency .  PTA dosing: 8.'5mg'$  daily except for '8mg'$  on Tue/Thur  INR remains 2.2 with no signs of bleeding - CBC stable  Goal of Therapy:  INR Goal 2-3 Monitor platelets by anticoagulation protocol: Yes   Plan:  Warfarin '10mg'$  PO once at 1600 Daily INR CBC every 3 days  Patient has been needing higher doses compared to home regimen and will likely need increased dose on discharge with close follow-up. Pharmacy will continue to follow for discharge recommendations.  Erskine Speed, PharmD Clinical Pharmacist 11/27/2021 11:16 AM

## 2021-11-27 NOTE — ED Notes (Signed)
Patient pad changed and patient repositioned. 1000 cc output in canister emptied. Call light within reach.

## 2021-11-28 DIAGNOSIS — M545 Low back pain, unspecified: Secondary | ICD-10-CM | POA: Diagnosis not present

## 2021-11-28 LAB — PROTIME-INR
INR: 2.2 — ABNORMAL HIGH (ref 0.8–1.2)
Prothrombin Time: 24.1 seconds — ABNORMAL HIGH (ref 11.4–15.2)

## 2021-11-28 MED ORDER — WARFARIN SODIUM 10 MG PO TABS
10.0000 mg | ORAL_TABLET | Freq: Once | ORAL | Status: AC
  Administered 2021-11-28: 10 mg via ORAL
  Filled 2021-11-28: qty 1

## 2021-11-28 NOTE — Progress Notes (Signed)
[facility] for Warfarin Indication:  paroxysmal A-fib and recurrent DVT history of factor V Leiden mutation and protein S deficiency  Allergies  Allergen Reactions   Cephalexin Other (See Comments)    Unknown reaction per [facility]    Chocolate Other (See Comments)    Unknown reaction per [facility]   Codeine Nausea And Vomiting   Diphenhydramine Hcl Itching   Ranitidine Rash    Patient Measurements: Height: '5\' 6"'$  (167.6 cm) Weight: (!) 218.2 kg (481 lb) IBW/kg (Calculated) : 59.3 Vital Signs: Temp: 98.2 F (36.8 C) (11/11 0647) Temp Source: Oral (11/11 0647) BP: 119/51 (11/11 0647) Pulse Rate: 63 (11/11 0647)  Labs: Recent Labs    11/26/21 1021 11/27/21 0500 11/28/21 0308  HGB  --  11.5*  --   HCT  --  38.5  --   PLT  --  262  --   LABPROT 24.3* 24.0* 24.1*  INR 2.2* 2.2* 2.2*     Estimated Creatinine Clearance: 157.8 mL/min (by C-G formula based on SCr of 0.49 mg/dL).  Assessment: 56 yo F with a history of factor V leiden on warfarin, morbid obesity, bedbound status, recent left tib/fib. Patient presented with back pain, bilateral leg pain. Warfarin per pharmacy consult placed for  paroxysmal A-fib and recurrent DVT history of factor V Leiden mutation and protein S deficiency .  PTA dosing: 8.'5mg'$  daily except for '8mg'$  on Tue/Thur  INR remains therapeutic and stable at 2.2 s/p a week of increased dosing compared to PTA  Goal of Therapy:  INR Goal 2-3 Monitor platelets by anticoagulation protocol: Yes   Plan:  Warfarin 10 mg PO x 1 today Daily INR  Bertis Ruddy, PharmD Clinical Pharmacist ED Pharmacist Phone # [phone] 11/28/2021 12:40 PM

## 2021-11-29 DIAGNOSIS — M545 Low back pain, unspecified: Secondary | ICD-10-CM | POA: Diagnosis not present

## 2021-11-29 LAB — PROTIME-INR
INR: 2.3 — ABNORMAL HIGH (ref 0.8–1.2)
Prothrombin Time: 25.5 seconds — ABNORMAL HIGH (ref 11.4–15.2)

## 2021-11-29 MED ORDER — WARFARIN SODIUM 10 MG PO TABS
10.0000 mg | ORAL_TABLET | Freq: Once | ORAL | Status: AC
  Administered 2021-11-29: 10 mg via ORAL
  Filled 2021-11-29: qty 1

## 2021-11-29 NOTE — ED Notes (Signed)
Patient requesting to have external catheter checked.  Catheter checked and tubing replaced.

## 2021-11-29 NOTE — ED Notes (Signed)
Pt cleaned. Updated on plan of care. Denies any other needs at this time.

## 2021-11-29 NOTE — ED Notes (Signed)
Patient observed resting quietly on bed in room.  Respirations even and unlabored.

## 2021-11-29 NOTE — ED Notes (Signed)
Patient reports no current complaints.  No concerns voiced.  Reports no needs at this time.

## 2021-11-29 NOTE — Progress Notes (Signed)
[facility] for Warfarin Indication:  paroxysmal A-fib and recurrent DVT history of factor V Leiden mutation and protein S deficiency  Allergies  Allergen Reactions   Cephalexin Other (See Comments)    Unknown reaction per [facility]    Chocolate Other (See Comments)    Unknown reaction per [facility]   Codeine Nausea And Vomiting   Diphenhydramine Hcl Itching   Ranitidine Rash    Patient Measurements: Height: '5\' 6"'$  (167.6 cm) Weight: (!) 218.2 kg (481 lb) IBW/kg (Calculated) : 59.3 Vital Signs:    Labs: Recent Labs    11/27/21 0500 11/28/21 0308 11/29/21 0745  HGB 11.5*  --   --   HCT 38.5  --   --   PLT 262  --   --   LABPROT 24.0* 24.1* 25.5*  INR 2.2* 2.2* 2.3*     Estimated Creatinine Clearance: 157.8 mL/min (by C-G formula based on SCr of 0.49 mg/dL).  Assessment: 56 yo F with a history of factor V leiden on warfarin, morbid obesity, bedbound status, recent left tib/fib. Patient presented with back pain, bilateral leg pain. Warfarin per pharmacy consult placed for  paroxysmal A-fib and recurrent DVT history of factor V Leiden mutation and protein S deficiency .  PTA dosing: 8.'5mg'$  daily except for '8mg'$  on Tue/Thur  INR stable and therapeutic on '72mg'$ /week compared to PTA weekly dose of 58.'5mg'$   Goal of Therapy:  INR Goal 2-3 Monitor platelets by anticoagulation protocol: Yes   Plan:  Warfarin 10 mg PO x 1 today Daily INR  Bertis Ruddy, PharmD Clinical Pharmacist ED Pharmacist Phone # [phone] 11/29/2021 10:32 AM

## 2021-11-29 NOTE — ED Notes (Signed)
Patient resting quietly in room with eyes closed.  Respirations even and unlabored.  Will continue to monitor

## 2021-11-29 NOTE — ED Notes (Signed)
Pt cleaned up and adjusted in bed. Updated on plan of care. Breakfast at bedside.

## 2021-11-30 DIAGNOSIS — M545 Low back pain, unspecified: Secondary | ICD-10-CM | POA: Diagnosis not present

## 2021-11-30 LAB — CBC
HCT: 35.5 % — ABNORMAL LOW (ref 36.0–46.0)
Hemoglobin: 11.2 g/dL — ABNORMAL LOW (ref 12.0–15.0)
MCH: 26.9 pg (ref 26.0–34.0)
MCHC: 31.5 g/dL (ref 30.0–36.0)
MCV: 85.3 fL (ref 80.0–100.0)
Platelets: 268 10*3/uL (ref 150–400)
RBC: 4.16 MIL/uL (ref 3.87–5.11)
RDW: 16.6 % — ABNORMAL HIGH (ref 11.5–15.5)
WBC: 10.6 10*3/uL — ABNORMAL HIGH (ref 4.0–10.5)
nRBC: 0 % (ref 0.0–0.2)

## 2021-11-30 LAB — PROTIME-INR
INR: 2.2 — ABNORMAL HIGH (ref 0.8–1.2)
Prothrombin Time: 23.8 seconds — ABNORMAL HIGH (ref 11.4–15.2)

## 2021-11-30 MED ORDER — WARFARIN SODIUM 10 MG PO TABS
10.0000 mg | ORAL_TABLET | Freq: Once | ORAL | Status: AC
  Administered 2021-11-30: 10 mg via ORAL
  Filled 2021-11-30: qty 1

## 2021-11-30 NOTE — Progress Notes (Signed)
OT Cancellation Note  Patient Details Name: PAITYNN MIKUS MRN: [id number] DOB: [birth year]   Cancelled Treatment:    Reason Eval/Treat Not Completed: Patient declined, no reason specified Pt declined PT/OT session this AM, requested therapy to follow up later today after pain meds. Will follow up as scheduling permits.  Layla Maw 11/30/2021, 11:59 AM

## 2021-11-30 NOTE — ED Notes (Signed)
PT at bedside.

## 2021-11-30 NOTE — Progress Notes (Signed)
Physical Therapy Treatment Patient Details Name: Beth Marshall MRN: [id number] DOB: 1965/08/15 Today's Date: 11/30/2021   History of Present Illness Pt is a 56 y.o. female admitted from SNF rehab on 11/12/21 with back pain and x4 month and intermittent L leg pain, weakness, and numbness. Pt has been at [facility] for rehab since June secondary to chronic back pain s/p L tib fib fx (BLE WBAT). PMH includes OA, back pain, hx of DVT, edema, factor V leiden mutation, GERD, anxiety, hypokalemia, insomnia, intrinsic asthma, lipoma, migraine, obesity, tachycardia.    PT Comments    Pt with good participation with exercises but not able to stand today despite multiple attempts and methods.  She felt her pain meds had not "kicked in, " despite appropriate time frame from time given.  Continue to progress as able.  Pt reports mornings preferred and making sure she has had her meds.     Recommendations for follow up therapy are one component of a multi-disciplinary discharge planning process, led by the attending physician.  Recommendations may be updated based on patient status, additional functional criteria and insurance authorization.  Follow Up Recommendations  Skilled nursing-short term rehab (<3 hours/day) Can patient physically be transported by private vehicle: No   Assistance Recommended at Discharge Frequent or constant Supervision/Assistance  Patient can return home with the following Two people to help with walking and/or transfers;A lot of help with bathing/dressing/bathroom;Assistance with cooking/housework;Assist for transportation;Help with stairs or ramp for entrance   Equipment Recommendations  Other (comment) (defer to snf)    Recommendations for Other Services       Precautions / Restrictions Precautions Precautions: Fall Restrictions Weight Bearing Restrictions: No Other Position/Activity Restrictions: confirmed WBAT BLE per Dr. Nechama Guard 11/14/21     Mobility   Bed Mobility Overal bed mobility: Needs Assistance Bed Mobility: Supine to Sit, Sit to Supine, Rolling Rolling: Min assist   Supine to sit: Supervision, HOB elevated Sit to supine: Mod assist   General bed mobility comments: with increased time and use of bedrails, pt able to sit EOB. Mod A to get BLE back up into bed with difficulty d/t sizewise mattress. able to roll with Min guard to Min A to full turn hips for repositioning bed pads    Transfers Overall transfer level: Needs assistance Equipment used: Rolling walker (2 wheels) Transfers: Sit to/from Stand Sit to Stand: Max assist, +2 safety/equipment           General transfer comment: Attempted various methods for standing at bedside with deflated bed though despite Max A x 2, pt unable to clear bottom or come into standing after 4+ attempts    Ambulation/Gait                   Stairs             Wheelchair Mobility    Modified Rankin (Stroke Patients Only)       Balance Overall balance assessment: Needs assistance Sitting-balance support: Feet supported, No upper extremity supported Sitting balance-[name]: Fair Sitting balance - Comments: Able to sit EOB without UE support   Standing balance support: Bilateral upper extremity supported Standing balance-[name]: Poor                              Cognition Arousal/Alertness: Awake/alert Behavior During Therapy: WFL for tasks assessed/performed Overall Cognitive Status: No family/caregiver present to determine baseline cognitive functioning  General Comments: likely at cognitive baseline        Exercises General Exercises - Lower Extremity Ankle Circles/Pumps: AROM, 10 reps, Supine, Both Quad Sets: AROM, Both, 10 reps, Supine Long Arc Quad: AROM, Both, 20 reps, Seated (with min manual resistance) Heel Slides: AROM, 10 reps, Both, Supine Hip ABduction/ADduction: AROM, 10  reps, Both, Supine Straight Leg Raises: AROM, Both, 10 reps, Supine Hip Flexion/Marching: AROM, 10 reps, Both, Seated Other Exercises Other Exercises: Low bridging x10 feet blocked; Pushing up in bed with use of extremities and feet blocked x 5 pushes. Other Exercises: All with cues for slow controlled motion    General Comments General comments (skin integrity, edema, etc.): Coordinated with RN for pain premedication prior to therapy attempts per pt request and checked back within reasonable time frame though pt still reports pain meds likely not kicked in and this hindering ability to stand today.      Pertinent Vitals/Pain Pain Assessment Pain Assessment: Faces Faces Pain Scale: Hurts a little bit Pain Location: L knee Pain Descriptors / Indicators: Grimacing, Guarding Pain Intervention(s): Monitored during session, Premedicated before session    Home Living                          Prior Function            PT Goals (current goals can now be found in the care plan section) Acute Rehab PT Goals Time For Goal Achievement: 12/11/21    Frequency    Min 3X/week      PT Plan Current plan remains appropriate    Co-evaluation PT/OT/SLP Co-Evaluation/Treatment: Yes Reason for Co-Treatment: For patient/therapist safety   OT goals addressed during session: ADL's and self-care      AM-PAC PT "6 Clicks" Mobility   Outcome Measure  Help needed turning from your back to your side while in a flat bed without using bedrails?: A Little Help needed moving from lying on your back to sitting on the side of a flat bed without using bedrails?: A Little Help needed moving to and from a bed to a chair (including a wheelchair)?: Total Help needed standing up from a chair using your arms (e.g., wheelchair or bedside chair)?: Total Help needed to walk in hospital room?: Total Help needed climbing 3-5 steps with a railing? : Total 6 Click Score: 10    End of Session  Equipment Utilized During Treatment: Gait belt Activity Tolerance: Patient tolerated treatment well Patient left: in bed;with call bell/phone within reach Nurse Communication: Mobility status PT Visit Diagnosis: Muscle weakness (generalized) (M62.81);Difficulty in walking, not elsewhere classified (R26.2)     Time: 7425-9563 PT Time Calculation (min) (ACUTE ONLY): 34 min  Charges:  $Therapeutic Activity: 8-22 mins                     Abran Richard, PT Acute Rehab [facility] Re[facility] [phone]    Karlton Lemon 11/30/2021, 2:41 PM

## 2021-11-30 NOTE — ED Provider Notes (Signed)
Emergency Medicine Observation Re-evaluation Note  Beth Marshall is a 56 y.o. female, seen on rounds today.  Pt initially presented to the ED for complaints of Back Pain Currently, the patient is resting comfortably, reading from her iPad.  Physical Exam  BP (!) 125/59 (BP Location: Right Arm)   Pulse (!) 56   Temp 98 F (36.7 C) (Oral)   Resp 16   Ht '5\' 6"'$  (1.676 m)   Wt (!) 218.2 kg   SpO2 100%   BMI 77.64 kg/m  Physical Exam General: No distress Cardiac: Regular rate Lungs: No respiratory distress Psych: AOx3  ED Course / MDM  EKG:   I have reviewed the labs performed to date as well as medications administered while in observation.  Recent changes in the last 24 hours include no new changes and there are no updates.  Plan  Current plan is for [facility] team looking for placement.. It appears that patient declined OT evaluation. Patient states that she declined OT because she wanted something for pain before getting PT-OT.  She wants PT team to come back at 1 PM.   Varney Biles, MD 11/30/21 1312

## 2021-11-30 NOTE — Progress Notes (Signed)
[place] for Warfarin Indication:  paroxysmal A-fib and recurrent DVT history of factor V Leiden mutation and protein S deficiency  Allergies  Allergen Reactions   Cephalexin Other (See Comments)    Unknown reaction per [facility]    Chocolate Other (See Comments)    Unknown reaction per [facility]   Codeine Nausea And Vomiting   Diphenhydramine Hcl Itching   Ranitidine Rash    Patient Measurements: Height: '5\' 6"'$  (167.6 cm) Weight: (!) 218.2 kg (481 lb) IBW/kg (Calculated) : 59.3 Vital Signs: Temp: 98 F (36.7 C) (11/13 0742) Temp Source: Oral (11/13 0742) BP: 125/59 (11/13 0551) Pulse Rate: 56 (11/13 0551)  Labs: Recent Labs    11/28/21 0308 11/29/21 0745 11/30/21 0525  HGB  --   --  11.2*  HCT  --   --  35.5*  PLT  --   --  268  LABPROT 24.1* 25.5* 23.8*  INR 2.2* 2.3* 2.2*     Estimated Creatinine Clearance: 157.8 mL/min (by C-G formula based on SCr of 0.49 mg/dL).  Assessment: 56 yo F with a history of factor V leiden on warfarin, morbid obesity, bedbound status, recent left tib/fib. Patient presented with back pain, bilateral leg pain. Warfarin per pharmacy consult placed for  paroxysmal A-fib and recurrent DVT history of factor V Leiden mutation and protein S deficiency .  PTA dosing: 8.'5mg'$  daily except for '8mg'$  on Tue/Thur  INR stable and therapeutic on '72mg'$ /week compared to PTA weekly dose of 58.'5mg'$ . CBC stable. No bleed issues reported.  Goal of Therapy:  INR Goal 2-3 Monitor platelets by anticoagulation protocol: Yes   Plan:  Warfarin 10 mg PO x 1 again today Monitor INR daily, CBC, s/sx bleeding   Arturo Morton, PharmD, BCPS Please check AMION for all [place] contact numbers Clinical Pharmacist 11/30/2021 8:16 AM

## 2021-11-30 NOTE — Progress Notes (Signed)
Occupational Therapy Treatment Patient Details Name: PATTRICIA WEIHER MRN: [id number] DOB: 1965-12-31 Today's Date: 11/30/2021   History of present illness Pt is a 56 y.o. female admitted from SNF rehab on 11/12/21 with back pain and x4 month and intermittent L leg pain, weakness, and numbness. Pt has been at [facility] for rehab since June secondary to chronic back pain s/p L tib fib fx (BLE WBAT). PMH includes OA, back pain, hx of DVT, edema, factor V leiden mutation, GERD, anxiety, hypokalemia, insomnia, intrinsic asthma, lipoma, migraine, obesity, tachycardia.   OT comments  Pt with slower progress towards OT goals, downgraded and updated accordingly. Coordinated for pain premedication though despite multiple modifications, pt unable to successfully stand today. Pt is proficient with UE HEP using level 4 theraband, able to complete UB ADLs with limited assist though will continue to require extensive assist for LB ADLs d/t transfer difficulties. With current room setup and equipment limitations, difficult to efficiently progress pt at this time. Will continue to follow acutely though will reduce to 1x/wk.    Recommendations for follow up therapy are one component of a multi-disciplinary discharge planning process, led by the attending physician.  Recommendations may be updated based on patient status, additional functional criteria and insurance authorization.    Follow Up Recommendations  Skilled nursing-short term rehab (<3 hours/day)     Assistance Recommended at Discharge Frequent or constant Supervision/Assistance  Patient can return home with the following  Two people to help with walking and/or transfers;Two people to help with bathing/dressing/bathroom   Equipment Recommendations  Hospital bed;Wheelchair cushion (measurements OT);Wheelchair (measurements OT);Other (comment) (hoyer lift)    Recommendations for Other Services      Precautions / Restrictions  Precautions Precautions: Fall Restrictions Weight Bearing Restrictions: No       Mobility Bed Mobility Overal bed mobility: Needs Assistance Bed Mobility: Supine to Sit, Sit to Supine, Rolling Rolling: Min assist   Supine to sit: Supervision, HOB elevated Sit to supine: Mod assist   General bed mobility comments: with increased time and use of bedrails, pt able to sit EOB. Mod A to get BLE back up into bed with difficulty d/t sizewise mattress. able to roll with Min guard to Min A to full turn hips for repositioning bed pads    Transfers Overall transfer level: Needs assistance Equipment used: Rolling walker (2 wheels) Transfers: Sit to/from Stand             General transfer comment: Attempted various methods for standing at bedside with deflated bed though despite Max A x 2, pt unable to clear bottom or come into standing after 4+ attempts     Balance Overall balance assessment: Needs assistance Sitting-balance support: Feet supported, No upper extremity supported Sitting balance-[name]: Fair                                     ADL either performed or assessed with clinical judgement   ADL Overall ADL's : Needs assistance/impaired                     Lower Body Dressing: Total assistance;Bed level                 General ADL Comments: Focus on standing trials though pt unable to clear bottom from bed today despite +2 assist and various attempts. Pt reports proficiency with UE HEP using level 4 therband  Extremity/Trunk Assessment Upper Extremity Assessment Upper Extremity Assessment: Overall WFL for tasks assessed   Lower Extremity Assessment Lower Extremity Assessment: Defer to PT evaluation        Vision   Vision Assessment?: No apparent visual deficits   Perception     Praxis      Cognition Arousal/Alertness: Awake/alert Behavior During Therapy: WFL for tasks assessed/performed Overall Cognitive Status: No  family/caregiver present to determine baseline cognitive functioning                                 General Comments: likely at cognitive baseline        Exercises      Shoulder Instructions       General Comments Coordinated with RN for pain premedication prior to therapy attempts per pt request and checked back within reasonable time frame though pt still reports pain meds likely not kicked in and this hindering ability to stand today.    Pertinent Vitals/ Pain       Pain Assessment Pain Assessment: Faces Faces Pain Scale: Hurts a little bit Pain Location: L knee Pain Descriptors / Indicators: Grimacing, Guarding Pain Intervention(s): Monitored during session, Premedicated before session  Home Living                                          Prior Functioning/Environment              Frequency  Min 1X/week        Progress Toward Goals  OT Goals(current goals can now be found in the care plan section)  Progress towards OT goals: Not progressing toward goals - comment  Acute Rehab OT Goals Patient Stated Goal: be able to stand for 20 seconds OT Goal Formulation: With patient Time For Goal Achievement: 12/14/21 Potential to Achieve Goals: Good  Plan Discharge plan remains appropriate;Frequency needs to be updated    Co-evaluation    PT/OT/SLP Co-Evaluation/Treatment: Yes Reason for Co-Treatment: For patient/therapist safety;To address functional/ADL transfers   OT goals addressed during session: ADL's and self-care      AM-PAC OT "6 Clicks" Daily Activity     Outcome Measure   Help from another person eating meals?: None Help from another person taking care of personal grooming?: A Little Help from another person toileting, which includes using toliet, bedpan, or urinal?: Total Help from another person bathing (including washing, rinsing, drying)?: A Lot Help from another person to put on and taking off regular upper  body clothing?: A Little Help from another person to put on and taking off regular lower body clothing?: Total 6 Click Score: 14    End of Session Equipment Utilized During Treatment: Gait belt;Rolling walker (2 wheels)  OT Visit Diagnosis: Unsteadiness on feet (R26.81);Other abnormalities of gait and mobility (R26.89);Muscle weakness (generalized) (M62.81);Pain Pain - Right/Left: Left Pain - part of body: Knee   Activity Tolerance Patient tolerated treatment well   Patient Left in bed;with call bell/phone within reach   Nurse Communication Mobility status (scale on bed)        Time: 5053-9767 OT Time Calculation (min): 34 min  Charges: OT General Charges $OT Visit: 1 Visit OT Treatments $Therapeutic Activity: 8-22 mins  Malachy Chamber, OTR/L Acute Rehab Services Office: [phone]   Layla Maw 11/30/2021, 2:22 PM

## 2021-12-01 DIAGNOSIS — M545 Low back pain, unspecified: Secondary | ICD-10-CM | POA: Diagnosis not present

## 2021-12-01 LAB — PROTIME-INR
INR: 2.3 — ABNORMAL HIGH (ref 0.8–1.2)
Prothrombin Time: 25.4 seconds — ABNORMAL HIGH (ref 11.4–15.2)

## 2021-12-01 MED ORDER — WARFARIN SODIUM 10 MG PO TABS
10.0000 mg | ORAL_TABLET | Freq: Once | ORAL | Status: AC
  Administered 2021-12-01: 10 mg via ORAL
  Filled 2021-12-01: qty 1

## 2021-12-01 NOTE — Progress Notes (Signed)
CSW spoke with Barnett Applebaum of [facility] who states that she will request [facility] to review patient for possible admission.  CSW spoke with Jackelyn Poling at [facility] who is agreeable to reconsider patient for possible admission.  CSW sent updated clinicals for review.  Madilyn Fireman, MSW, LCSW Transitions of Care  Clinical Social Worker II [phone]

## 2021-12-01 NOTE — ED Notes (Signed)
Sister at bedside.

## 2021-12-01 NOTE — Progress Notes (Signed)
[facility] for Warfarin Indication:  paroxysmal A-fib and recurrent DVT history of factor V Leiden mutation and protein S deficiency  Allergies  Allergen Reactions   Cephalexin Other (See Comments)    Unknown reaction per [facility]    Chocolate Other (See Comments)    Unknown reaction per [facility]   Codeine Nausea And Vomiting   Diphenhydramine Hcl Itching   Ranitidine Rash    Patient Measurements: Height: '5\' 6"'$  (167.6 cm) Weight: (!) 185.5 kg (409 lb) IBW/kg (Calculated) : 59.3 Vital Signs: Temp: 98 F (36.7 C) (11/14 1015) Temp Source: Oral (11/14 1015) BP: 124/77 (11/14 1015) Pulse Rate: 67 (11/14 1015)  Labs: Recent Labs    11/29/21 0745 11/30/21 0525 12/01/21 0928  HGB  --  11.2*  --   HCT  --  35.5*  --   PLT  --  268  --   LABPROT 25.5* 23.8* 25.4*  INR 2.3* 2.2* 2.3*     Estimated Creatinine Clearance: 141 mL/min (by C-G formula based on SCr of 0.49 mg/dL).  Assessment: 56 yo F with a history of factor V leiden on warfarin, morbid obesity, bedbound status, recent left tib/fib. Patient presented with back pain, bilateral leg pain. Warfarin per pharmacy consult placed for  paroxysmal A-fib and recurrent DVT history of factor V Leiden mutation and protein S deficiency .  PTA dosing: 8.'5mg'$  daily except for '8mg'$  on Tue/Thur  INR stable and therapeutic on '72mg'$ /week compared to PTA weekly dose of 58.'5mg'$ . CBC stable. No bleed issues reported.  Goal of Therapy:  INR Goal 2-3 Monitor platelets by anticoagulation protocol: Yes   Plan:  Warfarin 10 mg PO x 1 again today Monitor INR daily, CBC, s/sx bleeding   Leory Plowman, PharmD Student Please check AMION for all [place] contact numbers 12/01/2021 10:17 AM

## 2021-12-01 NOTE — ED Notes (Signed)
Pt's chux wet with urine; chux changed, pt cleaned

## 2021-12-01 NOTE — ED Notes (Signed)
Pt incontinent of urine, stool; pt cleaned, linens replaced; purwick soiled, new purwick applied

## 2021-12-01 NOTE — ED Provider Notes (Signed)
Emergency Medicine Observation Re-evaluation Note  Beth Marshall is a 56 y.o. female, seen on rounds today.  Pt initially presented to the ED for complaints of Back Pain Currently, the patient is sleeping.  Physical Exam  BP (!) 110/50   Pulse (!) 59   Temp 97.6 F (36.4 C) (Oral)   Resp 16   Ht '5\' 6"'$  (1.676 m)   Wt (!) 185.5 kg   SpO2 95%   BMI 66.01 kg/m  Physical Exam General: nad Cardiac: regluar Lungs: clear Psych: sleeping, not agitated  ED Course / MDM  EKG:   I have reviewed the labs performed to date as well as medications administered while in observation.  Recent changes in the last 24 hours include did have PT and OT come back but difficulty participating in activities due to pain.  PT unable to get her out of bed despite maximum effort.  Plan  Current plan is for SNF placement.    Blanchie Dessert, MD 12/01/21 [phone]

## 2021-12-02 DIAGNOSIS — M545 Low back pain, unspecified: Secondary | ICD-10-CM | POA: Diagnosis not present

## 2021-12-02 LAB — PROTIME-INR
INR: 2.3 — ABNORMAL HIGH (ref 0.8–1.2)
Prothrombin Time: 25.2 seconds — ABNORMAL HIGH (ref 11.4–15.2)

## 2021-12-02 MED ORDER — WARFARIN SODIUM 10 MG PO TABS
10.0000 mg | ORAL_TABLET | Freq: Once | ORAL | Status: AC
  Administered 2021-12-02: 10 mg via ORAL
  Filled 2021-12-02: qty 1

## 2021-12-02 NOTE — ED Provider Notes (Signed)
Emergency Medicine Observation Re-evaluation Note  Beth Marshall is a 56 y.o. female, seen on rounds today.  Pt initially presented to the ED for complaints of Back Pain Currently, the patient is resting comfortably.  Physical Exam  BP (!) 106/54   Pulse 69   Temp 97.9 F (36.6 C)   Resp 16   Ht '5\' 6"'$  (1.676 m)   Wt (!) 185.5 kg   SpO2 100%   BMI 66.01 kg/m  Physical Exam General: NAD   ED Course / MDM  EKG:   I have reviewed the labs performed to date as well as medications administered while in observation.  Recent changes in the last 24 hours include no acute events reported.  Plan  Current plan is for SNF placement.    Valarie Merino, MD 12/02/21 [phone]

## 2021-12-02 NOTE — Progress Notes (Signed)
[facility] for Warfarin Indication:  paroxysmal A-fib and recurrent DVT history of factor V Leiden mutation and protein S deficiency  Allergies  Allergen Reactions   Cephalexin Other (See Comments)    Unknown reaction per [facility]    Chocolate Other (See Comments)    Unknown reaction per [facility]   Codeine Nausea And Vomiting   Diphenhydramine Hcl Itching   Ranitidine Rash    Patient Measurements: Height: '5\' 6"'$  (167.6 cm) Weight: (!) 185.5 kg (409 lb) IBW/kg (Calculated) : 59.3 Vital Signs:    Labs: Recent Labs    11/30/21 0525 12/01/21 0928 12/02/21 0439  HGB 11.2*  --   --   HCT 35.5*  --   --   PLT 268  --   --   LABPROT 23.8* 25.4* 25.2*  INR 2.2* 2.3* 2.3*     Estimated Creatinine Clearance: 141 mL/min (by C-G formula based on SCr of 0.49 mg/dL).  Assessment: 56 yo F with a history of factor V leiden on warfarin, morbid obesity, bedbound status, recent left tib/fib. Patient presented with back pain, bilateral leg pain. Warfarin per pharmacy consult placed for  paroxysmal A-fib and recurrent DVT history of factor V Leiden mutation and protein S deficiency .  PTA dosing: 8.'5mg'$  daily except for '8mg'$  on Tue/Thur  INR stable and therapeutic on '72mg'$ /week compared to PTA weekly dose of 58.'5mg'$ . CBC stable. No bleed issues reported.  Goal of Therapy:  INR Goal 2-3 Monitor platelets by anticoagulation protocol: Yes   Plan:  Warfarin 10 mg PO x 1 again today Monitor INR daily, CBC, s/sx bleeding   Arturo Morton, PharmD, BCPS Please check AMION for all [place] contact numbers Clinical Pharmacist 12/02/2021 7:34 AM

## 2021-12-02 NOTE — ED Notes (Signed)
Full linen change provided and patient repositioned in the bed

## 2021-12-02 NOTE — Progress Notes (Addendum)
CSW attempted to reach [place] at [facility] without success - a text was sent requesting an update.  CSW spoke with Perrin Smack at [facility] who states the facility cannot accept the patient.  Madilyn Fireman, MSW, LCSW Transitions of Care  Clinical Social Worker II [phone]

## 2021-12-03 DIAGNOSIS — M545 Low back pain, unspecified: Secondary | ICD-10-CM | POA: Diagnosis not present

## 2021-12-03 LAB — CBC
HCT: 36.3 % (ref 36.0–46.0)
Hemoglobin: 11.3 g/dL — ABNORMAL LOW (ref 12.0–15.0)
MCH: 26.7 pg (ref 26.0–34.0)
MCHC: 31.1 g/dL (ref 30.0–36.0)
MCV: 85.8 fL (ref 80.0–100.0)
Platelets: 265 10*3/uL (ref 150–400)
RBC: 4.23 MIL/uL (ref 3.87–5.11)
RDW: 16.6 % — ABNORMAL HIGH (ref 11.5–15.5)
WBC: 9.5 10*3/uL (ref 4.0–10.5)
nRBC: 0 % (ref 0.0–0.2)

## 2021-12-03 LAB — PROTIME-INR
INR: 2.2 — ABNORMAL HIGH (ref 0.8–1.2)
Prothrombin Time: 24.6 seconds — ABNORMAL HIGH (ref 11.4–15.2)

## 2021-12-03 MED ORDER — WARFARIN SODIUM 10 MG PO TABS
10.0000 mg | ORAL_TABLET | Freq: Once | ORAL | Status: AC
  Administered 2021-12-03: 10 mg via ORAL
  Filled 2021-12-03: qty 1

## 2021-12-03 NOTE — Progress Notes (Signed)
Physical Therapy Treatment Patient Details Name: Beth Marshall MRN: [id number] DOB: [birth year] Today's Date: 12/03/2021   History of Present Illness Pt is a 56 y.o. female admitted from SNF rehab on 11/12/21 with back pain and x4 month and intermittent L leg pain, weakness, and numbness. Pt has been at [facility] for rehab since June secondary to chronic back pain s/p L tib fib fx (BLE WBAT). PMH includes OA, back pain, hx of DVT, edema, factor V leiden mutation, GERD, anxiety, hypokalemia, insomnia, intrinsic asthma, lipoma, migraine, obesity, tachycardia.    PT Comments    Pt admitted with above diagnosis. Pt limited somewhat today due to multiple interruptions during session and scheduling issues regarding timing of session.  Pt was able to sit EOB  and perform exercises.  Pt appreciative and assisted back to bed comfortably at end of session. Pt continues to make progress slowly. Pt currently with functional limitations due to balance and endurance deficits. Pt will benefit from skilled PT to increase their independence and safety with mobility to allow discharge to the venue listed below.      Recommendations for follow up therapy are one component of a multi-disciplinary discharge planning process, led by the attending physician.  Recommendations may be updated based on patient status, additional functional criteria and insurance authorization.  Follow Up Recommendations  Skilled nursing-short term rehab (<3 hours/day) Can patient physically be transported by private vehicle: No   Assistance Recommended at Discharge Frequent or constant Supervision/Assistance  Patient can return home with the following Two people to help with walking and/or transfers;A lot of help with bathing/dressing/bathroom;Assistance with cooking/housework;Assist for transportation;Help with stairs or ramp for entrance   Equipment Recommendations  Other (comment) (defer to snf)    Recommendations for Other  Services       Precautions / Restrictions Precautions Precautions: Fall Precaution Comments: bladder/bowel incontinence Restrictions Weight Bearing Restrictions: No Other Position/Activity Restrictions: confirmed WBAT BLE per Dr. Nechama Guard 11/14/21     Mobility  Bed Mobility Overal bed mobility: Needs Assistance Bed Mobility: Supine to Sit, Sit to Supine, Rolling Rolling: Min assist   Supine to sit: Supervision, HOB elevated, Min guard (incr time) Sit to supine: Mod assist   General bed mobility comments: with increased time and use of bedrails, pt able to sit EOB. Mod A to get BLE back up into bed with difficulty d/t sizewise mattress. able to roll with Min guard to Min A to full turn hips for repositioning bed pads    Transfers                   General transfer comment: unable today due to multiple interruptions while +2 in room    Ambulation/Gait               General Gait Details: unable   Stairs             Wheelchair Mobility    Modified Rankin (Stroke Patients Only)       Balance Overall balance assessment: Needs assistance Sitting-balance support: Feet supported, No upper extremity supported Sitting balance-[name]: Fair Sitting balance - Comments: Able to sit EOB without UE support, did some LE exercise                                    Cognition Arousal/Alertness: Awake/alert Behavior During Therapy: WFL for tasks assessed/performed Overall Cognitive Status: No family/caregiver present to determine  baseline cognitive functioning                                 General Comments: likely at cognitive baseline        Exercises General Exercises - Lower Extremity Ankle Circles/Pumps: AROM, 10 reps, Supine, Both Quad Sets: AROM, Both, 10 reps, Supine Long Arc Quad: AROM, Both, 20 reps, Seated (with min manual resistance) Heel Slides: AROM, 10 reps, Both, Supine Hip Flexion/Marching: AROM, 10  reps, Both, Seated Other Exercises Other Exercises: Low bridging x5 feet blocked; Pushing up in bed with use of extremities and feet blocked x 5 pushes.    General Comments General comments (skin integrity, edema, etc.): Attempt to coordinate pain meds with tech time unsuccessful. Pt still agreed to sit EOB and to do some exercises.      Pertinent Vitals/Pain Pain Assessment Pain Assessment: Faces Faces Pain Scale: Hurts even more Pain Location: L knee Pain Descriptors / Indicators: Grimacing, Guarding Pain Intervention(s): Limited activity within patient's tolerance, Monitored during session, Repositioned, RN gave pain meds during session (at beginning of session)    Home Living                          Prior Function            PT Goals (current goals can now be found in the care plan section) Acute Rehab PT Goals Patient Stated Goal: to go to a new SNF for rehab then home Progress towards PT goals: Progressing toward goals    Frequency    Min 3X/week      PT Plan Current plan remains appropriate    Co-evaluation              AM-PAC PT "6 Clicks" Mobility   Outcome Measure  Help needed turning from your back to your side while in a flat bed without using bedrails?: A Little Help needed moving from lying on your back to sitting on the side of a flat bed without using bedrails?: A Little Help needed moving to and from a bed to a chair (including a wheelchair)?: Total Help needed standing up from a chair using your arms (e.g., wheelchair or bedside chair)?: Total Help needed to walk in hospital room?: Total Help needed climbing 3-5 steps with a railing? : Total 6 Click Score: 10    End of Session   Activity Tolerance: Patient tolerated treatment well;Patient limited by pain Patient left: in bed;with call bell/phone within reach Nurse Communication: Mobility status PT Visit Diagnosis: Muscle weakness (generalized) (M62.81);Difficulty in walking,  not elsewhere classified (R26.2)     Time: 0923-3007 PT Time Calculation (min) (ACUTE ONLY): 33 min  Charges:  $Therapeutic Exercise: 8-22 mins $Therapeutic Activity: 8-22 mins                     Beth Marshall,PT Acute Rehab Services [phone]    Beth Marshall 12/03/2021, 10:13 AM

## 2021-12-03 NOTE — ED Notes (Signed)
Please give pain medication an hr and half prior to PT which will be tomorrow btw 830-9

## 2021-12-03 NOTE — Progress Notes (Signed)
[facility] for Warfarin Indication:  paroxysmal A-fib and recurrent DVT history of factor V Leiden mutation and protein S deficiency  Allergies  Allergen Reactions   Cephalexin Other (See Comments)    Unknown reaction per [facility]    Chocolate Other (See Comments)    Unknown reaction per [facility]   Codeine Nausea And Vomiting   Diphenhydramine Hcl Itching   Ranitidine Rash    Patient Measurements: Height: '5\' 6"'$  (167.6 cm) Weight: (!) 185.5 kg (409 lb) IBW/kg (Calculated) : 59.3 Vital Signs:    Labs: Recent Labs    12/01/21 0928 12/02/21 0439 12/03/21 0916  HGB  --   --  11.3*  HCT  --   --  36.3  PLT  --   --  265  LABPROT 25.4* 25.2* 24.6*  INR 2.3* 2.3* 2.2*     CrCl cannot be calculated (Patient's most recent lab result is older than the maximum 21 days allowed.).  Assessment: 56 yo F with a history of factor V leiden on warfarin, morbid obesity, bedbound status, recent left tib/fib. Patient presented with back pain, bilateral leg pain. Warfarin per pharmacy consult placed for  paroxysmal A-fib and recurrent DVT history of factor V Leiden mutation and protein S deficiency .  PTA dosing: 8.'5mg'$  daily except for '8mg'$  on Tue/Thur  INR stable and therapeutic on '72mg'$ /week compared to PTA weekly dose of 58.'5mg'$ . CBC stable. No bleed issues reported.  Goal of Therapy:  INR Goal 2-3 Monitor platelets by anticoagulation protocol: Yes   Plan:  Warfarin 10 mg PO x 1 again today Monitor INR daily, CBC, s/sx bleeding   Arturo Morton, PharmD, BCPS Please check AMION for all [place] contact numbers Clinical Pharmacist 12/03/2021 10:09 AM

## 2021-12-03 NOTE — ED Notes (Signed)
PT and OT at the bedside.

## 2021-12-03 NOTE — ED Provider Notes (Signed)
Emergency Medicine Observation Re-evaluation Note  Beth Marshall is a 56 y.o. female, seen on rounds today.  Pt initially presented to the ED for complaints of Back Pain Currently, the patient is resting comfortably no acute distress.  Physical Exam  BP 126/60 (BP Location: Right Arm)   Pulse 70   Temp 98.5 F (36.9 C) (Oral)   Resp 15   Ht '5\' 6"'$  (1.676 m)   Wt (!) 185.5 kg   SpO2 98%   BMI 66.01 kg/m  Physical Exam General: Appears to be resting comfortably in bed, no acute distress. Cardiac: Regular rate, normal heart rate, non-emergent blood pressure for this morning's vitals. Lungs: No increased work of breathing.  Equal chest rise appreciated Psych: Calm, asleep in bed.   ED Course / MDM  EKG:   I have reviewed the labs performed to date as well as medications administered while in observation.   Plan  Current plan is for social work consultation for placement.Tretha Sciara, MD 12/03/21 [phone]

## 2021-12-04 DIAGNOSIS — M545 Low back pain, unspecified: Secondary | ICD-10-CM | POA: Diagnosis not present

## 2021-12-04 LAB — PROTIME-INR
INR: 2.2 — ABNORMAL HIGH (ref 0.8–1.2)
Prothrombin Time: 24.4 seconds — ABNORMAL HIGH (ref 11.4–15.2)

## 2021-12-04 MED ORDER — WARFARIN SODIUM 10 MG PO TABS
10.0000 mg | ORAL_TABLET | Freq: Once | ORAL | Status: AC
  Administered 2021-12-04: 10 mg via ORAL
  Filled 2021-12-04: qty 1

## 2021-12-04 NOTE — Progress Notes (Signed)
Physical Therapy Treatment Patient Details Name: Beth Marshall MRN: [id number] DOB: 23-Mar-1965 Today's Date: 12/04/2021   History of Present Illness Pt is a 56 y.o. female admitted from SNF rehab on 11/12/21 with back pain and x4 month and intermittent L leg pain, weakness, and numbness. Pt has been at [facility] for rehab since June secondary to chronic back pain s/p L tib fib fx (BLE WBAT). PMH includes OA, back pain, hx of DVT, edema, factor V leiden mutation, GERD, anxiety, hypokalemia, insomnia, intrinsic asthma, lipoma, migraine, obesity, tachycardia.    PT Comments    Pt admitted with above diagnosis. Pt was able to sit EOB and attempts to stand unsuccessful for full stand but pt was able to raise buttocks off bed 2 inches x 2 to RW with max assist of 2. Pt continues to progress slowly. Will continue to follow acutely.  Pt currently with functional limitations due to balance and endurance deficits. Pt will benefit from skilled PT to increase their independence and safety with mobility to allow discharge to the venue listed below.      Recommendations for follow up therapy are one component of a multi-disciplinary discharge planning process, led by the attending physician.  Recommendations may be updated based on patient status, additional functional criteria and insurance authorization.  Follow Up Recommendations  Skilled nursing-short term rehab (<3 hours/day) Can patient physically be transported by private vehicle: No   Assistance Recommended at Discharge Frequent or constant Supervision/Assistance  Patient can return home with the following Two people to help with walking and/or transfers;A lot of help with bathing/dressing/bathroom;Assistance with cooking/housework;Assist for transportation;Help with stairs or ramp for entrance   Equipment Recommendations  Other (comment) (defer to snf)    Recommendations for Other Services OT consult     Precautions / Restrictions  Precautions Precautions: Fall Precaution Comments: bladder/bowel incontinence Restrictions Weight Bearing Restrictions: No Other Position/Activity Restrictions: confirmed WBAT BLE per Dr. Nechama Guard 11/14/21     Mobility  Bed Mobility Overal bed mobility: Needs Assistance Bed Mobility: Supine to Sit, Sit to Supine, Rolling Rolling: Supervision   Supine to sit: Supervision, HOB elevated, Min guard (incr time) Sit to supine: Mod assist   General bed mobility comments: with increased time and use of bedrails, pt able to sit EOB. Mod A to get BLE back up into bed with difficulty d/t sizewise mattress. able to roll with Min guard to supervision to full turn hips for repositioning bed pads    Transfers Overall transfer level: Needs assistance Equipment used: Rolling walker (2 wheels) Transfers: Sit to/from Stand Sit to Stand: Max assist, +2 safety/equipment, +2 physical assistance          Lateral/Scoot Transfers: Min guard, Supervision General transfer comment: Pt was able to clear buttocks off of bed approximately 2 inches x 2 with use of RW in front of her and pt pullsing up on RW.  +2 for safety.  Pt then laterally scooting to [facility] with out assist to then lie down.    Ambulation/Gait               General Gait Details: unable   Stairs             Wheelchair Mobility    Modified Rankin (Stroke Patients Only)       Balance Overall balance assessment: Needs assistance Sitting-balance support: Feet supported, No upper extremity supported Sitting balance-[name]: Fair Sitting balance - Comments: Able to sit EOB without UE support, did some LE exercise  Standing balance support: Bilateral upper extremity supported Standing balance-[name]: Poor Standing balance comment: Able to raise buttocks 2 inches off bed with minimal weight beraring on bil LE with +2 max assist and RW                            Cognition Arousal/Alertness:  Awake/alert Behavior During Therapy: WFL for tasks assessed/performed Overall Cognitive Status: No family/caregiver present to determine baseline cognitive functioning                                 General Comments: likely at cognitive baseline        Exercises General Exercises - Lower Extremity Ankle Circles/Pumps: AROM, 10 reps, Supine, Both Quad Sets: AROM, Both, 10 reps, Supine Long Arc Quad: AROM, Both, 20 reps, Seated (with min manual resistance) Heel Slides: AROM, 10 reps, Both, Supine Hip Flexion/Marching: AROM, 10 reps, Both, Seated Other Exercises Other Exercises: Low bridging x5 feet blocked; Pushing up in bed with use of extremities and feet blocked x 5 pushes.    General Comments General comments (skin integrity, edema, etc.): Pain meds prior to treatment.      Pertinent Vitals/Pain Pain Assessment Pain Assessment: Faces Faces Pain Scale: Hurts even more Pain Location: L knee Pain Descriptors / Indicators: Grimacing, Guarding Pain Intervention(s): Limited activity within patient's tolerance, Monitored during session, Repositioned, Premedicated before session    Home Living                          Prior Function            PT Goals (current goals can now be found in the care plan section) Acute Rehab PT Goals Patient Stated Goal: to go to a new SNF for rehab then home Progress towards PT goals: Progressing toward goals    Frequency    Min 3X/week      PT Plan Current plan remains appropriate    Co-evaluation              AM-PAC PT "6 Clicks" Mobility   Outcome Measure  Help needed turning from your back to your side while in a flat bed without using bedrails?: A Little Help needed moving from lying on your back to sitting on the side of a flat bed without using bedrails?: A Little Help needed moving to and from a bed to a chair (including a wheelchair)?: Total Help needed standing up from a chair using your  arms (e.g., wheelchair or bedside chair)?: Total Help needed to walk in hospital room?: Total Help needed climbing 3-5 steps with a railing? : Total 6 Click Score: 10    End of Session Equipment Utilized During Treatment: Gait belt Activity Tolerance: Patient tolerated treatment well;Patient limited by pain Patient left: in bed;with call bell/phone within reach Nurse Communication: Mobility status PT Visit Diagnosis: Muscle weakness (generalized) (M62.81);Difficulty in walking, not elsewhere classified (R26.2)     Time: 7425-9563 PT Time Calculation (min) (ACUTE ONLY): 25 min  Charges:  $Therapeutic Exercise: 8-22 mins $Therapeutic Activity: 8-22 mins                     [facility] M,PT Acute Rehab Services [phone]    Alvira Philips 12/04/2021, 2:01 PM

## 2021-12-04 NOTE — Progress Notes (Signed)
There are no available updates regarding patient placement at this time.  Madilyn Fireman, MSW, LCSW Transitions of Care  Clinical Social Worker II [phone]

## 2021-12-04 NOTE — ED Provider Notes (Signed)
Emergency Medicine Observation Re-evaluation Note  Beth Marshall is a 56 y.o. female, seen on rounds today.  Pt initially presented to the ED for complaints of Back Pain Currently, the patient is resting comfortably no acute distress.  Physical Exam  BP (!) 106/56 (BP Location: Right Arm)   Pulse 73   Temp 97.9 F (36.6 C)   Resp 16   Ht '5\' 6"'$  (1.676 m)   Wt (!) 185.5 kg   SpO2 100%   BMI 66.01 kg/m  Physical Exam General: Appears to be resting comfortably in bed, no acute distress. Cardiac: Regular rate, normal heart rate, non-emergent blood pressure for this morning's vitals. Lungs: No increased work of breathing.  Equal chest rise appreciated Psych: Calm, asleep in bed.   ED Course / MDM  EKG:   I have reviewed the labs performed to date as well as medications administered while in observation.   Plan  Current plan is for social work consultation for placement.Tretha Sciara, MD 12/04/21 [phone]

## 2021-12-04 NOTE — Progress Notes (Addendum)
[place] for Warfarin Indication:  paroxysmal A-fib and recurrent DVT history of factor V Leiden mutation and protein S deficiency  Allergies  Allergen Reactions   Cephalexin Other (See Comments)    Unknown reaction per [facility]    Chocolate Other (See Comments)    Unknown reaction per [facility]   Codeine Nausea And Vomiting   Diphenhydramine Hcl Itching   Ranitidine Rash    Patient Measurements: Height: '5\' 6"'$  (167.6 cm) Weight: (!) 185.5 kg (409 lb) IBW/kg (Calculated) : 59.3 Vital Signs:    Labs: Recent Labs    12/02/21 0439 12/03/21 0916 12/04/21 0911  HGB  --  11.3*  --   HCT  --  36.3  --   PLT  --  265  --   LABPROT 25.2* 24.6* 24.4*  INR 2.3* 2.2* 2.2*     CrCl cannot be calculated (Patient's most recent lab result is older than the maximum 21 days allowed.).  Assessment: 56 yo F with a history of factor V leiden on warfarin, morbid obesity, bedbound status, recent left tib/fib. Patient presented with back pain, bilateral leg pain. Warfarin per pharmacy consult placed for  paroxysmal A-fib and recurrent DVT history of factor V Leiden mutation and protein S deficiency .  PTA dosing: 8.'5mg'$  daily except for '8mg'$  on Tue/Thur  INR stable and therapeutic on '70mg'$ /week compared to PTA weekly dose of 58.'5mg'$ . CBC stable. No bleed issues reported.  Goal of Therapy:  INR Goal 2-3 Monitor platelets by anticoagulation protocol: Yes   Plan:  Warfarin 10 mg PO x 1 again today Monitor INR daily, CBC, s/sx bleeding   Arturo Morton, PharmD, BCPS Please check AMION for all [place] contact numbers Clinical Pharmacist 12/04/2021 10:36 AM

## 2021-12-05 DIAGNOSIS — M545 Low back pain, unspecified: Secondary | ICD-10-CM | POA: Diagnosis not present

## 2021-12-05 LAB — PROTIME-INR
INR: 2.5 — ABNORMAL HIGH (ref 0.8–1.2)
INR: 2.3 — ABNORMAL HIGH (ref 0.8–1.2)
Prothrombin Time: 26.6 seconds — ABNORMAL HIGH (ref 11.4–15.2)
Prothrombin Time: 25.5 seconds — ABNORMAL HIGH (ref 11.4–15.2)

## 2021-12-05 MED ORDER — WARFARIN SODIUM 10 MG PO TABS
10.0000 mg | ORAL_TABLET | Freq: Every day | ORAL | Status: DC
  Administered 2021-12-05 – 2021-12-10 (×6): 10 mg via ORAL
  Filled 2021-12-05 (×9): qty 1

## 2021-12-05 NOTE — ED Notes (Signed)
Pt pads changed.

## 2021-12-05 NOTE — ED Notes (Signed)
Patient linen changed and cleaned up

## 2021-12-05 NOTE — ED Notes (Signed)
Patient cleaned up and changed

## 2021-12-05 NOTE — Progress Notes (Signed)
Beth Marshall for Warfarin Indication:  paroxysmal A-fib and recurrent DVT history of factor V Leiden mutation and protein S deficiency  Allergies  Allergen Reactions   Cephalexin Other (See Comments)    Unknown reaction per [facility]    Chocolate Other (See Comments)    Unknown reaction per [facility]   Codeine Nausea And Vomiting   Diphenhydramine Hcl Itching   Ranitidine Rash    Patient Measurements: Height: '5\' 6"'$  (167.6 cm) Weight: (!) 185.5 kg (409 lb) IBW/kg (Calculated) : 59.3 Vital Signs: Temp: 98.4 F (36.9 C) (11/18 1007) Temp Source: Oral (11/18 1007) BP: 122/76 (11/18 1007) Pulse Rate: 76 (11/18 1007)  Labs: Recent Labs    12/03/21 0916 12/04/21 0143 12/04/21 0911  HGB 11.3*  --   --   HCT 36.3  --   --   PLT 265  --   --   LABPROT 24.6* 26.6* 24.4*  INR 2.2* 2.5* 2.2*     CrCl cannot be calculated (Patient's most recent lab result is older than the maximum 21 days allowed.).  Assessment: 56 yo F with a history of factor V leiden on warfarin, morbid obesity, bedbound status, recent left tib/fib. Patient presented with back pain, bilateral leg pain. Warfarin per pharmacy consult placed for  paroxysmal A-fib and recurrent DVT history of factor V Leiden mutation and protein S deficiency .  PTA dosing: 8.'5mg'$  daily except for '8mg'$  on Tue/Thur  INR stable and therapeutic on '70mg'$ /week compared to PTA weekly dose of 58.'5mg'$ . CBC stable (last checked 11/16). No bleed issues reported.  Goal of Therapy:  INR Goal 2-3 Monitor platelets by anticoagulation protocol: Yes   Plan:  Will start warfarin '10mg'$  daily dose Pharmacy to adjust dose as neccessary based on INR Plan for MWF INRs starting Monday 11/20 if remains stable over weekend Monitor INR daily, CBC, s/sx bleeding  Lorelei Pont, PharmD, BCPS 12/05/2021 11:55 AM ED Clinical Pharmacist -  [phone]

## 2021-12-06 DIAGNOSIS — M545 Low back pain, unspecified: Secondary | ICD-10-CM | POA: Diagnosis not present

## 2021-12-06 LAB — CBC
HCT: 33.4 % — ABNORMAL LOW (ref 36.0–46.0)
Hemoglobin: 10.7 g/dL — ABNORMAL LOW (ref 12.0–15.0)
MCH: 27.5 pg (ref 26.0–34.0)
MCHC: 32 g/dL (ref 30.0–36.0)
MCV: 85.9 fL (ref 80.0–100.0)
Platelets: 227 10*3/uL (ref 150–400)
RBC: 3.89 MIL/uL (ref 3.87–5.11)
RDW: 16.6 % — ABNORMAL HIGH (ref 11.5–15.5)
WBC: 9.7 10*3/uL (ref 4.0–10.5)
nRBC: 0 % (ref 0.0–0.2)

## 2021-12-06 LAB — PROTIME-INR
INR: 2.5 — ABNORMAL HIGH (ref 0.8–1.2)
Prothrombin Time: 26.8 seconds — ABNORMAL HIGH (ref 11.4–15.2)

## 2021-12-06 NOTE — ED Notes (Signed)
Pt pad changed, peri-care completed.  New purewick placed at this time.

## 2021-12-06 NOTE — ED Notes (Signed)
Pt incontinent of bowel and urine.  Peri-care completed, new pads placed.  New purewick placed.

## 2021-12-06 NOTE — ED Notes (Signed)
Pt pad changed.  Peri-care completed.

## 2021-12-06 NOTE — Progress Notes (Signed)
[place] for Warfarin Indication:  paroxysmal A-fib and recurrent DVT history of factor V Leiden mutation and protein S deficiency  Allergies  Allergen Reactions   Cephalexin Other (See Comments)    Unknown reaction per [facility]    Chocolate Other (See Comments)    Unknown reaction per [facility]   Codeine Nausea And Vomiting   Diphenhydramine Hcl Itching   Ranitidine Rash    Patient Measurements: Height: '5\' 6"'$  (167.6 cm) Weight: (!) 185.5 kg (409 lb) IBW/kg (Calculated) : 59.3 Vital Signs:    Labs: Recent Labs    12/04/21 0911 12/05/21 1248 12/06/21 0330  HGB  --   --  10.7*  HCT  --   --  33.4*  PLT  --   --  227  LABPROT 24.4* 25.5* 26.8*  INR 2.2* 2.3* 2.5*     CrCl cannot be calculated (Patient's most recent lab result is older than the maximum 21 days allowed.).  Assessment: 56 yo F with a history of factor V leiden on warfarin, morbid obesity, bedbound status, recent left tib/fib. Patient presented with back pain, bilateral leg pain. Warfarin per pharmacy consult placed for  paroxysmal A-fib and recurrent DVT history of factor V Leiden mutation and protein S deficiency .  PTA dosing: 8.'5mg'$  daily except for '8mg'$  on Tue/Thur  INR stable and therapeutic on '70mg'$ /week compared to PTA weekly dose of 58.'5mg'$ . CBC remains stable (last checked 11/19). No bleed issues reported.  Goal of Therapy:  INR Goal 2-3 Monitor platelets by anticoagulation protocol: Yes   Plan:  Continue warfarin '10mg'$  daily dose Pharmacy to adjust dose as neccessary based on INR INR on MWFs unless unstable Monitor INR daily, CBC, s/sx bleeding  Lorelei Pont, PharmD, BCPS 12/06/2021 11:14 AM ED Clinical Pharmacist -  [phone]

## 2021-12-06 NOTE — ED Notes (Signed)
Pt pad changed.  Pt repositioned in bed.  Pt breakfast tray provided and set up per request.

## 2021-12-06 NOTE — ED Notes (Signed)
Linen changed. Pt getting ready for bed. No complaints at this time.

## 2021-12-07 DIAGNOSIS — M545 Low back pain, unspecified: Secondary | ICD-10-CM | POA: Diagnosis not present

## 2021-12-07 LAB — PROTIME-INR
INR: 2.4 — ABNORMAL HIGH (ref 0.8–1.2)
Prothrombin Time: 25.8 seconds — ABNORMAL HIGH (ref 11.4–15.2)

## 2021-12-07 NOTE — ED Notes (Signed)
Patient lights were turned off so that she could sleep. Patient is resting in bed. Call bell is within reach. No needs at this time.

## 2021-12-07 NOTE — Progress Notes (Signed)
[place] for Warfarin Indication:  paroxysmal A-fib and recurrent DVT history of factor V Leiden mutation and protein S deficiency  Allergies  Allergen Reactions   Cephalexin Other (See Comments)    Unknown reaction per [facility]    Chocolate Other (See Comments)    Unknown reaction per [facility]   Codeine Nausea And Vomiting   Diphenhydramine Hcl Itching   Ranitidine Rash    Patient Measurements: Height: '5\' 6"'$  (167.6 cm) Weight: (!) 185.5 kg (409 lb) IBW/kg (Calculated) : 59.3 Vital Signs:    Labs: Recent Labs    12/05/21 1248 12/06/21 0330 12/07/21 0438  HGB  --  10.7*  --   HCT  --  33.4*  --   PLT  --  227  --   LABPROT 25.5* 26.8* 25.8*  INR 2.3* 2.5* 2.4*     CrCl cannot be calculated (Patient's most recent lab result is older than the maximum 21 days allowed.).  Assessment: 56 yo F with a history of factor V leiden on warfarin, morbid obesity, bedbound status, recent left tib/fib. Patient presented with back pain, bilateral leg pain. Warfarin per pharmacy consult placed for  paroxysmal A-fib and recurrent DVT history of factor V Leiden mutation and protein S deficiency .  PTA dosing: 8.'5mg'$  daily except for '8mg'$  on Tue/Thur  INR stable and therapeutic on '70mg'$ /week compared to PTA weekly dose of 58.'5mg'$ . CBC remains stable (last checked 11/19). No bleed issues reported.  Goal of Therapy:  INR Goal 2-3 Monitor platelets by anticoagulation protocol: Yes   Plan:  Continue warfarin '10mg'$  daily INR MWF, CBC q72h, monitor s/s bleeding  Bertis Ruddy, PharmD Clinical Pharmacist ED Pharmacist Phone # [phone] 12/07/2021 7:39 AM

## 2021-12-07 NOTE — ED Provider Notes (Signed)
56 year old female who is anticoagulated on warfarin who presented with atraumatic chronic lower back pain.  I evaluated patient today and she was resting comfortably in no acute distress Physical Exam  BP 133/70 (BP Location: Right Arm)   Pulse 70   Temp 97.9 F (36.6 C) (Oral)   Resp 16   Ht '5\' 6"'$  (1.676 m)   Wt (!) 185.5 kg   SpO2 99%   BMI 66.01 kg/m   Physical Exam Constitutional:      Comments: Resting comfortably no acute distress  Cardiovascular:     Rate and Rhythm: Normal rate.  Pulmonary:     Effort: Pulmonary effort is normal.  Psychiatric:        Behavior: Behavior normal.     Procedures  Procedures  ED Course / MDM    Medical Decision Making Risk OTC drugs. Prescription drug management.  Reviewed patient's INR today which is therapeutic 2.3. Current plan is TOC working on placement for patient.       Elgie Congo, MD 12/07/21 [phone]

## 2021-12-07 NOTE — Progress Notes (Signed)
Physical Therapy Treatment Patient Details Name: Beth Marshall MRN: [id number] DOB: 05-17-1965 Today's Date: 12/07/2021   History of Present Illness Pt is a 56 y.o. female admitted from SNF rehab on 11/12/21 with back pain and x4 month and intermittent L leg pain, weakness, and numbness. Pt has been at [facility] for rehab since June secondary to chronic back pain s/p L tib fib fx (BLE WBAT). PMH includes OA, back pain, hx of DVT, edema, factor V leiden mutation, GERD, anxiety, hypokalemia, insomnia, intrinsic asthma, lipoma, migraine, obesity, tachycardia.    PT Comments    Pt admitted with above diagnosis. Pt continues to make slow progress toward goals but is continueing to make small gains.  Will decr frequency to 2 x week as pt is going to SNF in future and that is appropriate frequency for this pt.   Pt currently with functional limitations due to the deficits listed below (see PT Problem List). Pt will benefit from skilled PT to increase their independence and safety with mobility to allow discharge to the venue listed below.      Recommendations for follow up therapy are one component of a multi-disciplinary discharge planning process, led by the attending physician.  Recommendations may be updated based on patient status, additional functional criteria and insurance authorization.  Follow Up Recommendations  Skilled nursing-short term rehab (<3 hours/day) Can patient physically be transported by private vehicle: No   Assistance Recommended at Discharge Frequent or constant Supervision/Assistance  Patient can return home with the following Two people to help with walking and/or transfers;A lot of help with bathing/dressing/bathroom;Assistance with cooking/housework;Assist for transportation;Help with stairs or ramp for entrance   Equipment Recommendations  Other (comment) (defer to snf)    Recommendations for Other Services       Precautions / Restrictions  Precautions Precautions: Fall Precaution Comments: bladder/bowel incontinence Restrictions Weight Bearing Restrictions: No Other Position/Activity Restrictions: confirmed WBAT BLE per Dr. Nechama Guard 11/14/21     Mobility  Bed Mobility Overal bed mobility: Needs Assistance Bed Mobility: Supine to Sit, Sit to Supine, Rolling Rolling: Supervision   Supine to sit: Supervision, HOB elevated (incr time) Sit to supine: Mod assist   General bed mobility comments: with increased time and use of bedrails, pt able to sit EOB. Mod A to get BLE back up into bed with difficulty d/t sizewise mattress. able to roll with Min guard to supervision to full turn hips for repositioning bed pads    Transfers Overall transfer level: Needs assistance Equipment used: Rolling walker (2 wheels) Transfers: Sit to/from Stand Sit to Stand: Max assist, +2 safety/equipment, +2 physical assistance          Lateral/Scoot Transfers: Min guard, Supervision General transfer comment: Pt was able to clear buttocks off of bed approximately 2 inches x 3 with use of RW in front of her and pt pulling up on RW.  +2 for safety.  Pt then laterally scooting to [facility] with out assist to then lie down.    Ambulation/Gait               General Gait Details: unable   Stairs             Wheelchair Mobility    Modified Rankin (Stroke Patients Only)       Balance Overall balance assessment: Needs assistance Sitting-balance support: Feet supported, No upper extremity supported Sitting balance-[name]: Fair Sitting balance - Comments: Able to sit EOB without UE support, did some LE exercise  Standing balance support: Bilateral upper extremity supported Standing balance-[name]: Poor Standing balance comment: Able to raise buttocks 2 inches off bed with minimal weight beraring on bil LE with +2 max assist and RW                            Cognition Arousal/Alertness: Awake/alert Behavior  During Therapy: WFL for tasks assessed/performed Overall Cognitive Status: No family/caregiver present to determine baseline cognitive functioning                                 General Comments: likely at cognitive baseline        Exercises General Exercises - Upper Extremity Shoulder Flexion:  (30) Shoulder ABduction:  (30) Shoulder Horizontal ABduction:  (30) Elbow Flexion:  (30) Elbow Extension:  (30) General Exercises - Lower Extremity Long Arc Quad: AROM, Both, 20 reps, Seated (with min manual resistance) Heel Slides: AROM, 10 reps, Both, Supine Hip Flexion/Marching: AROM, 10 reps, Both, Seated Other Exercises Other Exercises: Low bridging x5 feet blocked; Pushing up in bed with use of extremities and feet blocked x 5 pushes.    General Comments        Pertinent Vitals/Pain Pain Assessment Pain Assessment: Faces Faces Pain Scale: Hurts even more Pain Location: L knee Pain Descriptors / Indicators: Grimacing, Guarding Pain Intervention(s): Limited activity within patient's tolerance, Monitored during session, Repositioned, Patient requesting pain meds-RN notified    Home Living                          Prior Function            PT Goals (current goals can now be found in the care plan section) Acute Rehab PT Goals Patient Stated Goal: to go to a new SNF for rehab then home Progress towards PT goals: Not progressing toward goals - comment (slow progress overall)    Frequency    Min 2X/week      PT Plan Frequency needs to be updated    Co-evaluation              AM-PAC PT "6 Clicks" Mobility   Outcome Measure  Help needed turning from your back to your side while in a flat bed without using bedrails?: A Little Help needed moving from lying on your back to sitting on the side of a flat bed without using bedrails?: A Little Help needed moving to and from a bed to a chair (including a wheelchair)?: Total Help needed  standing up from a chair using your arms (e.g., wheelchair or bedside chair)?: Total Help needed to walk in hospital room?: Total Help needed climbing 3-5 steps with a railing? : Total 6 Click Score: 10    End of Session Equipment Utilized During Treatment: Gait belt Activity Tolerance: Patient tolerated treatment well;Patient limited by pain Patient left: in bed;with call bell/phone within reach Nurse Communication: Mobility status PT Visit Diagnosis: Muscle weakness (generalized) (M62.81);Difficulty in walking, not elsewhere classified (R26.2)     Time: 4403-4742 PT Time Calculation (min) (ACUTE ONLY): 29 min  Charges:  $Therapeutic Exercise: 8-22 mins $Therapeutic Activity: 8-22 mins                     [facility] M,PT Acute Rehab Services [place] 12/07/2021, 4:31 PM

## 2021-12-08 DIAGNOSIS — M545 Low back pain, unspecified: Secondary | ICD-10-CM | POA: Diagnosis not present

## 2021-12-08 NOTE — ED Notes (Signed)
Patient was incontinent to urine. Linens were changed. Patient was cleaned and is now resting in bed.

## 2021-12-08 NOTE — ED Notes (Signed)
Pt incontinent of BM. Pericare performed and linen changed. Cream applied to buttocks.

## 2021-12-08 NOTE — ED Notes (Signed)
Patient was incontinent to urine. Purewick was changed due to it being soiled. New pads were placed under the patient.

## 2021-12-08 NOTE — Progress Notes (Signed)
Physical Therapy Treatment Patient Details Name: Beth Marshall MRN: [id number] DOB: March 01, 1965 Today's Date: 12/08/2021   History of Present Illness Pt is a 56 y.o. female admitted from SNF rehab on 11/12/21 with back pain and x4 month and intermittent L leg pain, weakness, and numbness. Pt has been at [facility] for rehab since June secondary to chronic back pain s/p L tib fib fx (BLE WBAT). PMH includes OA, back pain, hx of DVT, edema, factor V leiden mutation, GERD, anxiety, hypokalemia, insomnia, intrinsic asthma, lipoma, migraine, obesity, tachycardia.    PT Comments    Pt admitted with above diagnosis. Pt continues with difficulty with sit to stands due to weakness bil LES.  Got approval to try the [place] tilt bed to try and incr pts mobility and it should be delivered later today. Will continue therapy and progress pt as able.  Pt currently with functional limitations due to balance and endurance deficits. Pt will benefit from skilled PT to increase their independence and safety with mobility to allow discharge to the venue listed below.      Recommendations for follow up therapy are one component of a multi-disciplinary discharge planning process, led by the attending physician.  Recommendations may be updated based on patient status, additional functional criteria and insurance authorization.  Follow Up Recommendations  Skilled nursing-short term rehab (<3 hours/day) Can patient physically be transported by private vehicle: No   Assistance Recommended at Discharge Frequent or constant Supervision/Assistance  Patient can return home with the following Two people to help with walking and/or transfers;A lot of help with bathing/dressing/bathroom;Assistance with cooking/housework;Assist for transportation;Help with stairs or ramp for entrance   Equipment Recommendations  Other (comment) (defer to snf)    Recommendations for Other Services OT consult     Precautions /  Restrictions Precautions Precautions: Fall Precaution Comments: bladder/bowel incontinence Restrictions Weight Bearing Restrictions: No Other Position/Activity Restrictions: confirmed WBAT BLE per Dr. Nechama Guard 11/14/21     Mobility  Bed Mobility Overal bed mobility: Needs Assistance Bed Mobility: Supine to Sit, Sit to Supine, Rolling Rolling: Supervision   Supine to sit: Supervision, HOB elevated (incr time) Sit to supine: Mod assist   General bed mobility comments: with increased time and use of bedrails, pt able to sit EOB. Mod A to get BLE back up into bed with difficulty d/t airmattress. able to roll with Min guard to supervision to full turn hips for repositioning bed pads    Transfers Overall transfer level: Needs assistance Equipment used: Rolling walker (2 wheels) Transfers: Sit to/from Stand Sit to Stand: Max assist, +2 safety/equipment, +2 physical assistance          Lateral/Scoot Transfers: Min guard, Supervision General transfer comment: Pt was able to clear buttocks off of bed approximately 1 inch x 2 with use of RW in front of her and pt pulling up on RW.  +2 for safety.  Pt then laterally scooting to [facility] with out assist to then lie down.    Ambulation/Gait               General Gait Details: unable   Stairs             Wheelchair Mobility    Modified Rankin (Stroke Patients Only)       Balance Overall balance assessment: Needs assistance Sitting-balance support: Feet supported, No upper extremity supported Sitting balance-[name]: Fair Sitting balance - Comments: Able to sit EOB without UE support, did some LE exercise   Standing  balance support: Bilateral upper extremity supported Standing balance-[name]: Poor Standing balance comment: Able to raise buttocks 1 inch off bed with minimal weight beraring on bil LE with +2 max assist and RW                            Cognition Arousal/Alertness:  Awake/alert Behavior During Therapy: WFL for tasks assessed/performed Overall Cognitive Status: No family/caregiver present to determine baseline cognitive functioning                                 General Comments: likely at cognitive baseline        Exercises General Exercises - Upper Extremity Shoulder Flexion:  (30) Shoulder ABduction:  (30) Shoulder Horizontal ABduction:  (30) Elbow Flexion:  (30) Elbow Extension:  (30) General Exercises - Lower Extremity Ankle Circles/Pumps: AROM, 10 reps, Supine, Both Quad Sets: AROM, Both, 10 reps, Supine Long Arc Quad: AROM, Both, 20 reps, Seated (with min manual resistance) Heel Slides: AROM, 10 reps, Both, Supine Hip Flexion/Marching: AROM, 10 reps, Both, Seated Other Exercises Other Exercises: Low bridging x5 feet blocked; Pushing up in bed with use of extremities and feet blocked x 5 pushes.    General Comments        Pertinent Vitals/Pain Pain Assessment Pain Assessment: Faces Faces Pain Scale: Hurts even more Pain Location: L knee Pain Descriptors / Indicators: Grimacing, Guarding Pain Intervention(s): Limited activity within patient's tolerance, Monitored during session, Repositioned    Home Living                          Prior Function            PT Goals (current goals can now be found in the care plan section) Acute Rehab PT Goals Patient Stated Goal: to go to a new SNF for rehab then home Progress towards PT goals: Not progressing toward goals - comment (limited as she cant stand from air bed)    Frequency    Min 2X/week      PT Plan      Co-evaluation              AM-PAC PT "6 Clicks" Mobility   Outcome Measure  Help needed turning from your back to your side while in a flat bed without using bedrails?: A Little Help needed moving from lying on your back to sitting on the side of a flat bed without using bedrails?: A Little Help needed moving to and from a bed to a  chair (including a wheelchair)?: Total Help needed standing up from a chair using your arms (e.g., wheelchair or bedside chair)?: Total Help needed to walk in hospital room?: Total Help needed climbing 3-5 steps with a railing? : Total 6 Click Score: 10    End of Session Equipment Utilized During Treatment: Gait belt Activity Tolerance: Patient tolerated treatment well;Patient limited by pain Patient left: in bed;with call bell/phone within reach Nurse Communication: Mobility status PT Visit Diagnosis: Muscle weakness (generalized) (M62.81);Difficulty in walking, not elsewhere classified (R26.2)     Time: 5784-6962 PT Time Calculation (min) (ACUTE ONLY): 28 min  Charges:  $Therapeutic Exercise: 8-22 mins $Therapeutic Activity: 8-22 mins                     Beth Marshall,PT Acute Rehab Services [phone]  Beth Marshall 12/08/2021, 11:07 AM

## 2021-12-08 NOTE — ED Provider Notes (Signed)
Emergency Medicine Observation Re-evaluation Note  Beth Marshall is a 56 y.o. female, seen on rounds today.  Pt initially presented to the ED for complaints of Back Pain Currently, the patient is awake and alert.  Pt has been here for nearly a month for back pain and inability to walk.  Pt is morbidly obese and has DDD and foraminal stenosis.  Pt said she still has weakness in both legs.  PT has been working with her today.  They were able to get approval for a Kreg catalyst tilt bed to help get pt oob.  We are waiting for placement.  Physical Exam  BP (!) 112/92 (BP Location: Right Arm)   Pulse 69   Temp 98.2 F (36.8 C) (Oral)   Resp 17   Ht '5\' 6"'$  (1.676 m)   Wt (!) 185.5 kg   SpO2 100%   BMI 66.01 kg/m  Physical Exam General: awake and alert Cardiac: rr Lungs: clear  ED Course / MDM  EKG:   I have reviewed the labs performed to date as well as medications administered while in observation.  Recent changes in the last 24 hours include none.  Plan  Current plan is for awaiting placement.    Beth Pence, MD 12/08/21 1250

## 2021-12-09 ENCOUNTER — Emergency Department (HOSPITAL_BASED_OUTPATIENT_CLINIC_OR_DEPARTMENT_OTHER): Payer: Medicare Other

## 2021-12-09 DIAGNOSIS — R52 Pain, unspecified: Secondary | ICD-10-CM | POA: Diagnosis not present

## 2021-12-09 DIAGNOSIS — M545 Low back pain, unspecified: Secondary | ICD-10-CM | POA: Diagnosis not present

## 2021-12-09 LAB — CBC
HCT: 37.6 % (ref 36.0–46.0)
Hemoglobin: 12 g/dL (ref 12.0–15.0)
MCH: 27.4 pg (ref 26.0–34.0)
MCHC: 31.9 g/dL (ref 30.0–36.0)
MCV: 85.8 fL (ref 80.0–100.0)
Platelets: 275 10*3/uL (ref 150–400)
RBC: 4.38 MIL/uL (ref 3.87–5.11)
RDW: 16.6 % — ABNORMAL HIGH (ref 11.5–15.5)
WBC: 9.2 10*3/uL (ref 4.0–10.5)
nRBC: 0 % (ref 0.0–0.2)

## 2021-12-09 LAB — PROTIME-INR
INR: 2.3 — ABNORMAL HIGH (ref 0.8–1.2)
Prothrombin Time: 25.2 seconds — ABNORMAL HIGH (ref 11.4–15.2)

## 2021-12-09 MED ORDER — OXYCODONE HCL 5 MG PO TABS
5.0000 mg | ORAL_TABLET | Freq: Once | ORAL | Status: AC
  Administered 2021-12-09: 5 mg via ORAL
  Filled 2021-12-09: qty 1

## 2021-12-09 NOTE — ED Notes (Signed)
Ultrasound at bedside

## 2021-12-09 NOTE — Progress Notes (Signed)
CSW spoke with [facility] supervisor who states patient was recommended to be added to [place] for consistent therapies.  Madilyn Fireman, MSW, LCSW Transitions of Care  Clinical Social Worker II [phone]

## 2021-12-09 NOTE — ED Notes (Signed)
OT at bedside. 

## 2021-12-09 NOTE — Progress Notes (Signed)
Lower extremity venous has been completed.   Preliminary results in CV Proc.   Beth Marshall [name] 12/09/2021 10:05 AM

## 2021-12-09 NOTE — Progress Notes (Signed)
[facility] for Warfarin Indication:  paroxysmal A-fib and recurrent DVT history of factor V Leiden mutation and protein S deficiency  Allergies  Allergen Reactions   Cephalexin Other (See Comments)    Unknown reaction per [facility]    Chocolate Other (See Comments)    Unknown reaction per [facility]   Codeine Nausea And Vomiting   Diphenhydramine Hcl Itching   Ranitidine Rash    Patient Measurements: Height: '5\' 6"'$  (167.6 cm) Weight: (!) 185.5 kg (409 lb) IBW/kg (Calculated) : 59.3 Vital Signs: Temp: 98 F (36.7 C) (11/22 0649) Temp Source: Oral (11/22 0649) BP: 142/59 (11/22 0649) Pulse Rate: 70 (11/22 0649)  Labs: Recent Labs    12/07/21 0438 12/09/21 0526  HGB  --  12.0  HCT  --  37.6  PLT  --  275  LABPROT 25.8* 25.2*  INR 2.4* 2.3*     CrCl cannot be calculated (Patient's most recent lab result is older than the maximum 21 days allowed.).  Assessment: 56 yo F with a history of factor V leiden on warfarin, morbid obesity, bedbound status, recent left tib/fib. Patient presented with back pain, bilateral leg pain. Warfarin per pharmacy consult placed for  paroxysmal A-fib and recurrent DVT history of factor V Leiden mutation and protein S deficiency .  PTA dosing: 8.'5mg'$  daily except for '8mg'$  on Tue/Thur  INR continues to be stable and therapeutic on '70mg'$ /week compared to PTA weekly dose of 58.'5mg'$ . CBC remains stable (last checked 11/19).   Goal of Therapy:  INR Goal 2-3 Monitor platelets by anticoagulation protocol: Yes   Plan:  Continue warfarin '10mg'$  daily INR MWF, CBC q72h, monitor s/s bleeding  Bertis Ruddy, PharmD Clinical Pharmacist ED Pharmacist Phone # [phone] 12/09/2021 7:59 AM

## 2021-12-09 NOTE — Progress Notes (Addendum)
Occupational Therapy Treatment Patient Details Name: Beth Marshall MRN: [id number] DOB: [birth year] Today's Date: 12/09/2021   History of present illness Pt is a 56 y.o. female admitted from SNF rehab on 11/12/21 with back pain and x4 month and intermittent L leg pain, weakness, and numbness. Pt has been at [facility] for rehab since June secondary to chronic back pain s/p L tib fib fx (BLE WBAT). PMH includes OA, back pain, hx of DVT, edema, factor V leiden mutation, GERD, anxiety, hypokalemia, insomnia, intrinsic asthma, lipoma, migraine, obesity, tachycardia.   OT comments  Planned to focus on first tilting trial with Kreg bed. Coordinated with RN for pain premedication and set AM treatment time per pt's prior requests. However on entry, pt with multiple complaints (pain meds "too strong" though '5mg'$  as prior meds, need to be changed, etc). Despite reports of significant LLE pain and RLE numbness, pt able to roll side to side easily and without assistance for peri care. Began placing tilt bed straps though pt reporting she could not tolerate strap touching LLE. Ultimately unable to tilt today and no progress made towards goals. Will continue efforts as appropriate.    Recommendations for follow up therapy are one component of a multi-disciplinary discharge planning process, led by the attending physician.  Recommendations may be updated based on patient status, additional functional criteria and insurance authorization.    Follow Up Recommendations  Skilled nursing-short term rehab (<3 hours/day)     Assistance Recommended at Discharge Frequent or constant Supervision/Assistance  Patient can return home with the following  Two people to help with walking and/or transfers;Two people to help with bathing/dressing/bathroom   Equipment Recommendations  Hospital bed;Wheelchair cushion (measurements OT);Wheelchair (measurements OT);Other (comment) (hoyer lift)    Recommendations for Other  Services      Precautions / Restrictions Precautions Precautions: Fall Restrictions Weight Bearing Restrictions: No Other Position/Activity Restrictions: confirmed WBAT BLE per Dr. Nechama Guard 11/14/21       Mobility Bed Mobility Overal bed mobility: Needs Assistance Bed Mobility: Rolling Rolling: Supervision         General bed mobility comments: rolling side to side for peri care per pt request at start of OT session. able to easily lift BLE up in air to roll and hang over bedrail    Transfers                         Balance                                           ADL either performed or assessed with clinical judgement   ADL Overall ADL's : Needs assistance/impaired                             Toileting- Clothing Manipulation and Hygiene: Total assistance;Bed level         General ADL Comments: Extensive assist for LB ADLs bed level    Extremity/Trunk Assessment Upper Extremity Assessment Upper Extremity Assessment: Overall WFL for tasks assessed   Lower Extremity Assessment Lower Extremity Assessment: Defer to PT evaluation        Vision   Vision Assessment?: No apparent visual deficits   Perception     Praxis      Cognition Arousal/Alertness: Awake/alert Behavior During Therapy: WFL for tasks assessed/performed, Restless  Overall Cognitive Status: No family/caregiver present to determine baseline cognitive functioning                                 General Comments: likely at cognitive baseline, hyperverbose        Exercises      Shoulder Instructions       General Comments Coordinated with RN for pain premedication, tech time in AM per pt prior requests. However, on entry, pt with multiple complaints: pain meds too strong, "i dont even know where i am right now" (though able to direct care and request assist clearly and easily), L LE hurts too much, R LE numb (reported she couldnt feel  OT touching LE after pt watch OT touch RLE). Ulitmately unable to tilt based on pt behaviors and presentation.    Pertinent Vitals/ Pain       Pain Assessment Pain Assessment: Faces Faces Pain Scale: Hurts a little bit Pain Location: L knee Pain Descriptors / Indicators: Sore Pain Intervention(s): Monitored during session, Premedicated before session  Home Living                                          Prior Functioning/Environment              Frequency  Min 1X/week        Progress Toward Goals  OT Goals(current goals can now be found in the care plan section)  Progress towards OT goals: Not progressing toward goals - comment (self limiting today)  Acute Rehab OT Goals Patient Stated Goal: none stated today OT Goal Formulation: With patient Time For Goal Achievement: 12/14/21 Potential to Achieve Goals: Good ADL Goals Pt Will Transfer to Toilet: with mod assist;with +2 assist;stand pivot transfer;squat pivot transfer;bedside commode Additional ADL Goal #1: Pt to complete bed mobility with Modified [place] as precursor to ADLs Additional ADL Goal #2: Pt to stand from EOB with Mod A x 2 to assist with LB ADLs  Plan Discharge plan remains appropriate;Frequency needs to be updated    Co-evaluation                 AM-PAC OT "6 Clicks" Daily Activity     Outcome Measure   Help from another person eating meals?: None Help from another person taking care of personal grooming?: A Little Help from another person toileting, which includes using toliet, bedpan, or urinal?: Total Help from another person bathing (including washing, rinsing, drying)?: A Lot Help from another person to put on and taking off regular upper body clothing?: A Little Help from another person to put on and taking off regular lower body clothing?: Total 6 Click Score: 14    End of Session    OT Visit Diagnosis: Unsteadiness on feet (R26.81);Other abnormalities of  gait and mobility (R26.89);Muscle weakness (generalized) (M62.81);Pain Pain - Right/Left: Left Pain - part of body: Knee   Activity Tolerance Other (comment) (self limiting)   Patient Left in bed;with call bell/phone within reach;with family/visitor present   Nurse Communication Mobility status        Time: 3825-0539 OT Time Calculation (min): 19 min  Charges: OT General Charges $OT Visit: 1 Visit OT Treatments $Therapeutic Activity: 8-22 mins  Malachy Chamber, OTR/L Acute Rehab Services Office: [phone]   Layla Maw 12/09/2021, 11:17 AM

## 2021-12-09 NOTE — ED Notes (Signed)
Patient requesting to speak with EDP regarding legs hurting x 24 hours onset. Pt tearful. RN spoke with EDP who will attempt to make it over to see patient; Message relayed to [facility]

## 2021-12-09 NOTE — ED Provider Notes (Addendum)
Emergency Medicine Observation Re-evaluation Note  Beth Marshall is a 56 y.o. female, seen on rounds today.  Pt initially presented to the ED for complaints of Back Pain Currently, the patient is awake and alert.  She is complaining of worsening right leg pain.  She does have a hx of factor V Leiden and is on coumadin with INR goal b/t 2 and 3.  This morning's INR is 2.3.  She did get a new bed last night, so that may be the cause, but she has has several blood clots in the past.  Physical Exam  BP (!) 142/59   Pulse 70   Temp 98 F (36.7 C) (Oral)   Resp 18   Ht '5\' 6"'$  (1.676 m)   Wt (!) 185.5 kg   SpO2 98%   BMI 66.01 kg/m  Physical Exam General: awake and alert Cardiac: rr Lungs: clear Psych: calm R: leg:  numbness to right leg, but she has good pulses and good movement.  ED Course / MDM  EKG:   I have reviewed the labs performed to date as well as medications administered while in observation.  Recent changes in the last 24 hours include right leg pain.  Plan  Current plan is for [place].  Pharmacy following INRs.    Isla Pence, MD 12/09/21 [phone]  [place] complete and is negative for DVT.   Isla Pence, MD 12/09/21 1052

## 2021-12-10 DIAGNOSIS — M545 Low back pain, unspecified: Secondary | ICD-10-CM | POA: Diagnosis not present

## 2021-12-10 NOTE — ED Provider Notes (Signed)
Emergency Medicine Observation Re-evaluation Note  Beth Marshall is a 56 y.o. female, seen on rounds today.  Pt initially presented to the ED for complaints of Back Pain Currently, the patient is asleep.  Physical Exam  BP 124/77 (BP Location: Right Arm)   Pulse 64   Temp (!) 97.3 F (36.3 C) (Oral)   Resp 20   Ht '5\' 6"'$  (1.676 m)   Wt (!) 185.5 kg   SpO2 100%   BMI 66.01 kg/m  Physical Exam General: No distress Cardiac: regular rate Lungs: No respiratory distress Psych: calm  ED Course / MDM  EKG:   I have reviewed the labs performed to date as well as medications administered while in observation.  Recent changes in the last 24 hours include had DVT US for ongoing leg pain, which was negative. INR at goal yesterday, re-check tomorrow.  Plan  Current plan is for placement.    Cristie Hem, MD 12/10/21 [phone]

## 2021-12-11 DIAGNOSIS — M545 Low back pain, unspecified: Secondary | ICD-10-CM | POA: Diagnosis not present

## 2021-12-11 LAB — PROTIME-INR
INR: 2.9 — ABNORMAL HIGH (ref 0.8–1.2)
Prothrombin Time: 29.9 seconds — ABNORMAL HIGH (ref 11.4–15.2)

## 2021-12-11 MED ORDER — NYSTATIN 100000 UNIT/GM EX POWD
CUTANEOUS | Status: DC | PRN
  Filled 2021-12-11 (×4): qty 15

## 2021-12-11 MED ORDER — WARFARIN SODIUM 10 MG PO TABS
10.0000 mg | ORAL_TABLET | Freq: Every day | ORAL | Status: DC
  Administered 2021-12-12 – 2021-12-15 (×4): 10 mg via ORAL
  Filled 2021-12-11 (×4): qty 1

## 2021-12-11 MED ORDER — WARFARIN SODIUM 7.5 MG PO TABS
7.5000 mg | ORAL_TABLET | Freq: Once | ORAL | Status: AC
  Administered 2021-12-11: 7.5 mg via ORAL
  Filled 2021-12-11: qty 1

## 2021-12-11 NOTE — ED Provider Notes (Signed)
Emergency Medicine Observation Re-evaluation Note  Beth Marshall is a 56 y.o. female, seen on rounds today.  Pt initially presented to the ED for complaints of Back Pain Currently, the patient is sleeping.  Physical Exam  BP 124/77 (BP Location: Right Arm)   Pulse 64   Temp (!) 97.3 F (36.3 C) (Oral)   Resp 20   Ht '5\' 6"'$  (1.676 m)   Wt (!) 185.5 kg   SpO2 100%   BMI 66.01 kg/m  Physical Exam General: No acute distress Cardiac: Normal rate Lungs: No increased work of breathing Psych: Calm  ED Course / MDM  EKG:   I have reviewed the labs performed to date as well as medications administered while in observation.  Recent changes in the last 24 hours include none.  Plan  Current plan is for placement to a skilled nursing facility.    Malvin Johns, MD 12/11/21 [phone]

## 2021-12-11 NOTE — Progress Notes (Addendum)
Physical Therapy Treatment Patient Details Name: Beth Marshall MRN: [id number] DOB: October 13, 1965 Today's Date: 12/11/2021   History of Present Illness Pt is a 56 y.o. female admitted from SNF rehab on 11/12/21 with back pain and x4 month and intermittent L leg pain, weakness, and numbness. Pt has been at [facility] for rehab since June secondary to chronic back pain s/p L tib fib fx (BLE WBAT). PMH includes OA, back pain, hx of DVT, edema, factor V leiden mutation, GERD, anxiety, hypokalemia, insomnia, intrinsic asthma, lipoma, migraine, obesity, tachycardia.    PT Comments    Pt admitted with above diagnosis. Pt met 1/4 goals due to slow progress. Pt has made progress however limited by body habitus as well as bil LE pain and numbness. Goals revised. Pt tolerated tilt for 10 min total at 10- 45 degrees max.  Pt self limiting at times however does respond to being motivated by PT. Will continue and progress pt as able. Pt currently with functional limitations due to balance and endurance deficits. Pt will benefit from skilled PT to increase their independence and safety with mobility to allow discharge to the venue listed below.      Recommendations for follow up therapy are one component of a multi-disciplinary discharge planning process, led by the attending physician.  Recommendations may be updated based on patient status, additional functional criteria and insurance authorization.  Follow Up Recommendations  Skilled nursing-short term rehab (<3 hours/day) Can patient physically be transported by private vehicle: No   Assistance Recommended at Discharge Frequent or constant Supervision/Assistance  Patient can return home with the following Two people to help with walking and/or transfers;A lot of help with bathing/dressing/bathroom;Assistance with cooking/housework;Assist for transportation;Help with stairs or ramp for entrance   Equipment Recommendations  Other (comment) (defer to  snf)    Recommendations for Other Services       Precautions / Restrictions Precautions Precautions: Fall Precaution Comments: bladder/bowel incontinence Restrictions Weight Bearing Restrictions: No Other Position/Activity Restrictions: confirmed WBAT BLE per Dr. Nechama Guard 11/14/21     Mobility  Bed Mobility Overal bed mobility: Needs Assistance Bed Mobility: Rolling Rolling: Supervision         General bed mobility comments: pulled self up in bed with mod assist of 2 Start Time: 1525 Angle: 1535 degrees Total Minutes in Angle: 10 minutes Patient Response: Cooperative, Anxious (due to pain left knee)  Transfers Overall transfer level: Needs assistance Equipment used:  (Tilt bed) Transfers: Sit to/from Stand Sit to Stand: +2 safety/equipment, +2 physical assistance, Total assist           General transfer comment: used tilt bed to stand to 45 degrees with pt only able to tolerate 45 degrees for about 2 minutes.  She was able to be reclined to 30 degrees for another 4 minutes for a total of about 8 min in standing with weight bearing up to 165  lbs of her body weight    Ambulation/Gait               General Gait Details: unable   Stairs             Wheelchair Mobility    Modified Rankin (Stroke Patients Only)       Balance           Standing balance support: Bilateral upper extremity supported, During functional activity Standing balance-[name]: Poor Standing balance comment: Tilt bed total of 10 min with max angle 45 degrees  Cognition Arousal/Alertness: Awake/alert Behavior During Therapy: WFL for tasks assessed/performed, Restless Overall Cognitive Status: No family/caregiver present to determine baseline cognitive functioning                                 General Comments: likely at cognitive baseline, hyperverbose        Exercises General Exercises - Upper  Extremity Shoulder Flexion: AROM, Both, 20 reps, Seated Elbow Flexion:  (30) Theraband Level (Elbow Flexion): Level 4 (Blue) Elbow Extension:  (30) Theraband Level (Elbow Extension): Level 4 (Blue) General Exercises - Lower Extremity Ankle Circles/Pumps: AROM, 10 reps, Supine, Both Quad Sets: AROM, Both, 10 reps, Supine Heel Slides: AROM, 10 reps, Both, Supine    General Comments        Pertinent Vitals/Pain Pain Assessment Pain Assessment: Faces Faces Pain Scale: Hurts whole lot Pain Location: L knee Pain Descriptors / Indicators: Sore Pain Intervention(s): Limited activity within patient's tolerance, Monitored during session, Repositioned, Patient requesting pain meds-RN notified    Home Living                          Prior Function            PT Goals (current goals can now be found in the care plan section) Acute Rehab PT Goals Patient Stated Goal: to go to a new SNF for rehab then home PT Goal Formulation: With patient Time For Goal Achievement: 12/25/21 Potential to Achieve Goals: Fair Progress towards PT goals: Progressing toward goals    Frequency    Min 2X/week      PT Plan Current plan remains appropriate    Co-evaluation              AM-PAC PT "6 Clicks" Mobility   Outcome Measure  Help needed turning from your back to your side while in a flat bed without using bedrails?: A Little Help needed moving from lying on your back to sitting on the side of a flat bed without using bedrails?: A Little Help needed moving to and from a bed to a chair (including a wheelchair)?: Total Help needed standing up from a chair using your arms (e.g., wheelchair or bedside chair)?: Total Help needed to walk in hospital room?: Total Help needed climbing 3-5 steps with a railing? : Total 6 Click Score: 10    End of Session   Activity Tolerance: Patient tolerated treatment well;Patient limited by pain;Patient limited by fatigue Patient left: in  bed;with call bell/phone within reach;with family/visitor present Nurse Communication: Mobility status PT Visit Diagnosis: Muscle weakness (generalized) (M62.81);Difficulty in walking, not elsewhere classified (R26.2)     Time: 1500-1550 PT Time Calculation (min) (ACUTE ONLY): 50 min  Charges:  $Therapeutic Exercise: 8-22 mins $Therapeutic Activity: 23-37 mins                     [facility] M,PT Acute Rehab Services [phone]    Alvira Philips 12/11/2021, 4:24 PM

## 2021-12-11 NOTE — Progress Notes (Signed)
[place] for Warfarin Indication:  paroxysmal A-fib and recurrent DVT history of factor V Leiden mutation and protein S deficiency  Allergies  Allergen Reactions   Cephalexin Other (See Comments)    Unknown reaction per [facility]    Chocolate Other (See Comments)    Unknown reaction per [facility]   Codeine Nausea And Vomiting   Diphenhydramine Hcl Itching   Ranitidine Rash    Patient Measurements: Height: '5\' 6"'$  (167.6 cm) Weight: (!) 185.5 kg (409 lb) IBW/kg (Calculated) : 59.3 Vital Signs: Temp: 97.7 F (36.5 C) (11/24 1116) Temp Source: Oral (11/24 1116) BP: 118/60 (11/24 1116) Pulse Rate: 77 (11/24 1116)  Labs: Recent Labs    12/09/21 0526 12/11/21 1201  HGB 12.0  --   HCT 37.6  --   PLT 275  --   LABPROT 25.2* 29.9*  INR 2.3* 2.9*     CrCl cannot be calculated (Patient's most recent lab result is older than the maximum 21 days allowed.).  Assessment: 56 yo F with a history of factor V leiden on warfarin, morbid obesity, bedbound status, recent left tib/fib. Patient presented with back pain, bilateral leg pain. Warfarin per pharmacy consult placed for  paroxysmal A-fib and recurrent DVT history of factor V Leiden mutation and protein S deficiency .  PTA dosing: 8.'5mg'$  daily except for '8mg'$  on Tue/Thur  INR now up to some to 2.9 on '70mg'$ /week compared to PTA weekly dose of 58.'5mg'$ . CBC remains stable (last 11/22)  Goal of Therapy:  INR Goal 2-3 Monitor platelets by anticoagulation protocol: Yes   Plan:  Will give 7.'5mg'$  tonight then back on '10mg'$  daily INR in a.m. then M/W/F  Sherlon Handing, PharmD, BCPS Please see amion for complete clinical pharmacist phone list 12/11/2021 12:41 PM

## 2021-12-11 NOTE — ED Notes (Signed)
EDP by to see

## 2021-12-12 DIAGNOSIS — M545 Low back pain, unspecified: Secondary | ICD-10-CM | POA: Diagnosis not present

## 2021-12-12 LAB — CBC
HCT: 37.3 % (ref 36.0–46.0)
Hemoglobin: 11.4 g/dL — ABNORMAL LOW (ref 12.0–15.0)
MCH: 27.1 pg (ref 26.0–34.0)
MCHC: 30.6 g/dL (ref 30.0–36.0)
MCV: 88.6 fL (ref 80.0–100.0)
Platelets: 254 10*3/uL (ref 150–400)
RBC: 4.21 MIL/uL (ref 3.87–5.11)
RDW: 16.8 % — ABNORMAL HIGH (ref 11.5–15.5)
WBC: 6.7 10*3/uL (ref 4.0–10.5)
nRBC: 0 % (ref 0.0–0.2)

## 2021-12-12 LAB — PROTIME-INR
INR: 2.8 — ABNORMAL HIGH (ref 0.8–1.2)
Prothrombin Time: 29.4 seconds — ABNORMAL HIGH (ref 11.4–15.2)

## 2021-12-12 NOTE — Progress Notes (Signed)
There are no updates regarding placement at this time.  Madilyn Fireman, MSW, LCSW Transitions of Care  Clinical Social Worker II [phone]

## 2021-12-12 NOTE — ED Provider Notes (Signed)
Emergency Medicine Observation Re-evaluation Note  Beth Marshall is a 56 y.o. female, seen on rounds today.  Pt initially presented to the ED for complaints of Back Pain Currently, the patient is resting comfortably, reading a book.  Physical Exam  BP 118/60 (BP Location: Right Arm)   Pulse 77   Temp 97.7 F (36.5 C) (Oral)   Resp 17   Ht '5\' 6"'$  (1.676 m)   Wt (!) 185.5 kg   SpO2 91%   BMI 66.01 kg/m  Physical Exam General: No distress Cardiac: Regular rate Lungs: No respiratory distress Psych: Alert, calm  ED Course / MDM  EKG:   I have reviewed the labs performed to date as well as medications administered while in observation.  Recent changes in the last 24 hours include : No new changes.  No new updates per social work note. Patient has no complaints from her side  Plan  Current plan is for patient to continue to be a TOC hold. Vital signs are stable and within normal limits.  Patient has no complaints.    Varney Biles, MD 12/12/21 1159

## 2021-12-12 NOTE — Progress Notes (Signed)
[place] for Warfarin Indication:  paroxysmal A-fib and recurrent DVT history of factor V Leiden mutation and protein S deficiency  Allergies  Allergen Reactions   Cephalexin Other (See Comments)    Unknown reaction per [facility]    Chocolate Other (See Comments)    Unknown reaction per [facility]   Codeine Nausea And Vomiting   Diphenhydramine Hcl Itching   Ranitidine Rash    Patient Measurements: Height: '5\' 6"'$  (167.6 cm) Weight: (!) 185.5 kg (409 lb) IBW/kg (Calculated) : 59.3 Vital Signs:    Labs: Recent Labs    12/11/21 1201 12/12/21 1211  HGB  --  11.4*  HCT  --  37.3  PLT  --  254  LABPROT 29.9* 29.4*  INR 2.9* 2.8*     CrCl cannot be calculated (Patient's most recent lab result is older than the maximum 21 days allowed.).  Assessment: 56 yo F with a history of factor V leiden on warfarin, morbid obesity, bedbound status, recent left tib/fib. Patient presented with back pain, bilateral leg pain. Warfarin per pharmacy consult placed for  paroxysmal A-fib and recurrent DVT history of factor V Leiden mutation and protein S deficiency .  PTA dosing: 8.'5mg'$  daily except for '8mg'$  on Tue/Thur  INR 2.8 on '70mg'$ /week compared to PTA weekly dose of 58.'5mg'$ . CBC remains stable (last 11/22)  Goal of Therapy:  INR Goal 2-3 Monitor platelets by anticoagulation protocol: Yes   Plan:  Continue warfarin '10mg'$  daily INR M/W/F  Salome Arnt, PharmD, BCPS, BCEMP Clinical Pharmacist Please see AMION for all pharmacy numbers 12/12/2021 12:40 PM

## 2021-12-13 DIAGNOSIS — M545 Low back pain, unspecified: Secondary | ICD-10-CM | POA: Diagnosis not present

## 2021-12-13 NOTE — ED Provider Notes (Signed)
Emergency Medicine Observation Re-evaluation Note  Beth Marshall is a 56 y.o. female, seen on rounds today.  Pt initially presented to the ED for complaints of Back Pain Currently, the patient is asleep.  Physical Exam  BP (!) 133/59 (BP Location: Left Arm)   Pulse 77   Temp 98 F (36.7 C) (Oral)   Resp 18   Ht '5\' 6"'$  (1.676 m)   Wt (!) 185.5 kg   SpO2 100%   BMI 66.01 kg/m  Physical Exam General: No distress Cardiac: Regular rate Lungs: equal chest rise Psych: unable to assess  ED Course / MDM  EKG:   I have reviewed the labs performed to date as well as medications administered while in observation.  Recent changes in the last 24 hours include none. INR yesterday at goal.  Plan  Current plan is for [facility] placement in SNF.    Cristie Hem, MD 12/13/21 1214

## 2021-12-13 NOTE — ED Notes (Signed)
Pt cleaned up of urinary incontinence. Tolerated well.

## 2021-12-13 NOTE — ED Notes (Signed)
Dr. Hall at bedside.

## 2021-12-13 NOTE — ED Notes (Signed)
Pt is resting quietly in bed with eyes closed. Respirations even and unlabored. Call light within reach. No S/S of distress noted. Holding AM meds until patient wakes up.

## 2021-12-14 DIAGNOSIS — M545 Low back pain, unspecified: Secondary | ICD-10-CM | POA: Diagnosis not present

## 2021-12-14 LAB — PROTIME-INR
INR: 2.5 — ABNORMAL HIGH (ref 0.8–1.2)
Prothrombin Time: 26.9 seconds — ABNORMAL HIGH (ref 11.4–15.2)

## 2021-12-14 NOTE — Progress Notes (Signed)
[place] for Warfarin Indication:  paroxysmal A-fib and recurrent DVT history of factor V Leiden mutation and protein S deficiency  Allergies  Allergen Reactions   Cephalexin Other (See Comments)    Unknown reaction per [facility]    Chocolate Other (See Comments)    Unknown reaction per [facility]   Codeine Nausea And Vomiting   Diphenhydramine Hcl Itching   Ranitidine Rash    Patient Measurements: Height: '5\' 6"'$  (167.6 cm) Weight: (!) 185.5 kg (409 lb) IBW/kg (Calculated) : 59.3 Vital Signs: Temp: 98.2 F (36.8 C) (11/26 2012) Temp Source: Oral (11/26 2012) BP: 104/67 (11/26 2012) Pulse Rate: 95 (11/26 2012)  Labs: Recent Labs    12/11/21 1201 12/12/21 1211 12/14/21 0510  HGB  --  11.4*  --   HCT  --  37.3  --   PLT  --  254  --   LABPROT 29.9* 29.4* 26.9*  INR 2.9* 2.8* 2.5*     CrCl cannot be calculated (Patient's most recent lab result is older than the maximum 21 days allowed.).  Assessment: 56 yo F with a history of factor V leiden on warfarin, morbid obesity, bedbound status, recent left tib/fib. Patient presented with back pain, bilateral leg pain. Warfarin per pharmacy consult placed for  paroxysmal A-fib and recurrent DVT history of factor V Leiden mutation and protein S deficiency .  PTA dosing: 8.'5mg'$  daily except for '8mg'$  on Tue/Thur  INR 2.5 on '70mg'$ /week compared to PTA weekly dose of 58.'5mg'$ . CBC remains stable (last 11/25)  Goal of Therapy:  INR Goal 2-3 Monitor platelets by anticoagulation protocol: Yes   Plan:  Continue warfarin '10mg'$  PO daily INR MWF  Bertis Ruddy, PharmD Clinical Pharmacist ED Pharmacist Phone # [phone] 12/14/2021 7:31 AM

## 2021-12-14 NOTE — ED Provider Notes (Signed)
Emergency Medicine Observation Re-evaluation Note  CARMA DWIGGINS is a 56 y.o. female, seen on rounds today.  Pt initially presented to the ED for complaints of Back Pain Currently, the patient is resting.  Physical Exam  BP 104/67   Pulse 95   Temp 98.2 F (36.8 C) (Oral)   Resp 20   Ht '5\' 6"'$  (1.676 m)   Wt (!) 185.5 kg   SpO2 96%   BMI 66.01 kg/m  Physical Exam General: NAD Cardiac: well perfused Lungs: unlabored Psych: Deferred  ED Course / MDM  EKG:   I have reviewed the labs performed to date as well as medications administered while in observation.  Recent changes in the last 24 hours include None. INR today therapeutic.  Plan  Current plan is for [facility] for SNF placement.    Regan Lemming, MD 12/14/21 1017

## 2021-12-14 NOTE — Progress Notes (Signed)
Physical Therapy Treatment Patient Details Name: Beth Marshall MRN: [id number] DOB: 09/19/1965 Today's Date: 12/14/2021   History of Present Illness Pt is a 56 y.o. female admitted from SNF rehab on 11/12/21 with back pain and x4 month and intermittent L leg pain, weakness, and numbness. Pt has been at [facility] for rehab since June secondary to chronic back pain s/p L tib fib fx (BLE WBAT). PMH includes OA, back pain, hx of DVT, edema, factor V leiden mutation, GERD, anxiety, hypokalemia, insomnia, intrinsic asthma, lipoma, migraine, obesity, tachycardia.    PT Comments    Pt admitted with above diagnosis. Pt was able to stand to tilt bed to 30 progressing to 48 degree angle.  Pt tolerates 30 degree angle better to be able to do toe raises and to activate gluts and quads.  Once to 48 degrees, c/o significant left knee pain.  Also pt with external rotation of left LE in standing.  Pt continues to work with PT and tries to tolerate tilt.  Will continue to follow acutely. Pt currently with functional limitations due to balance and endurance deficits. Pt will benefit from skilled PT to increase their independence and safety with mobility to allow discharge to the venue listed below.      Recommendations for follow up therapy are one component of a multi-disciplinary discharge planning process, led by the attending physician.  Recommendations may be updated based on patient status, additional functional criteria and insurance authorization.  Follow Up Recommendations  Skilled nursing-short term rehab (<3 hours/day) Can patient physically be transported by private vehicle: No   Assistance Recommended at Discharge Frequent or constant Supervision/Assistance  Patient can return home with the following Two people to help with walking and/or transfers;A lot of help with bathing/dressing/bathroom;Assistance with cooking/housework;Assist for transportation;Help with stairs or ramp for entrance    Equipment Recommendations  Other (comment) (defer to snf)    Recommendations for Other Services OT consult     Precautions / Restrictions Precautions Precautions: Fall Precaution Comments: bladder/bowel incontinence Restrictions Weight Bearing Restrictions: No Other Position/Activity Restrictions: confirmed WBAT BLE per Dr. Nechama Guard 11/14/21     Mobility  Bed Mobility               General bed mobility comments: pulled self up in bed with mod assist of 2 Start Time: 1140 Angle: 48 degrees Total Minutes in Angle: 10 minutes Patient Response: Cooperative, Restless, Anxious  Transfers Overall transfer level: Needs assistance Equipment used:  (Tilt bed) Transfers: Sit to/from Stand Sit to Stand: +2 safety/equipment, +2 physical assistance, Total assist           General transfer comment: used tilt bed to stand with pt to 30 degrees for 2 minutes progressing to 45 degrees for 3 minutes. 48 degrees max with pt only able to tolerate 48 degrees for about 1 minute. She was able to be reclined to 30 degrees for another 4 minutes for a total of about 10 min in standing with weight bearing up to 200  lbs of her body weight.  Pt was trying to activate gluts and quads during tilt as well as pushing against end of bed onto her tip toes when pt was in 35-45 degrees tilt.    Ambulation/Gait               General Gait Details: unable   Stairs             Wheelchair Mobility    Modified Rankin (Stroke Patients Only)  Balance           Standing balance support: Bilateral upper extremity supported, During functional activity Standing balance-[name]: Poor Standing balance comment: Tilt bed total of 10 min with max angle 48 degrees                            Cognition Arousal/Alertness: Awake/alert Behavior During Therapy: WFL for tasks assessed/performed, Restless Overall Cognitive Status: No family/caregiver present to determine  baseline cognitive functioning                                 General Comments: likely at cognitive baseline, hyperverbose        Exercises General Exercises - Lower Extremity Ankle Circles/Pumps: AROM, 10 reps, Supine, Both Quad Sets: AROM, Both, 10 reps, Supine Heel Slides: AROM, 10 reps, Both, Supine Other Exercises Other Exercises: Low bridging x5 feet blocked; Pushing up in bed with use of extremities and feet blocked x 5 pushes.    General Comments        Pertinent Vitals/Pain Pain Assessment Pain Assessment: Faces Faces Pain Scale: Hurts little more Pain Location: L knee Pain Descriptors / Indicators: Sore Pain Intervention(s): Limited activity within patient's tolerance, Monitored during session, Premedicated before session, Repositioned    Home Living                          Prior Function            PT Goals (current goals can now be found in the care plan section) Acute Rehab PT Goals Patient Stated Goal: to go to a new SNF for rehab then home Progress towards PT goals: Progressing toward goals    Frequency    Min 2X/week      PT Plan Current plan remains appropriate    Co-evaluation              AM-PAC PT "6 Clicks" Mobility   Outcome Measure  Help needed turning from your back to your side while in a flat bed without using bedrails?: A Little Help needed moving from lying on your back to sitting on the side of a flat bed without using bedrails?: A Little Help needed moving to and from a bed to a chair (including a wheelchair)?: Total Help needed standing up from a chair using your arms (e.g., wheelchair or bedside chair)?: Total Help needed to walk in hospital room?: Total Help needed climbing 3-5 steps with a railing? : Total 6 Click Score: 10    End of Session   Activity Tolerance: Patient tolerated treatment well;Patient limited by pain;Patient limited by fatigue Patient left: in bed;with call bell/phone  within reach Nurse Communication: Mobility status PT Visit Diagnosis: Muscle weakness (generalized) (M62.81);Difficulty in walking, not elsewhere classified (R26.2)     Time: 8250-0370 PT Time Calculation (min) (ACUTE ONLY): 30 min  Charges:  $Therapeutic Exercise: 8-22 mins $Therapeutic Activity: 8-22 mins                     [facility] M,PT Acute Rehab Services [phone]    Beth Marshall 12/14/2021, 2:49 PM

## 2021-12-15 DIAGNOSIS — M545 Low back pain, unspecified: Secondary | ICD-10-CM | POA: Diagnosis not present

## 2021-12-15 LAB — CBC
HCT: 36.9 % (ref 36.0–46.0)
Hemoglobin: 11.1 g/dL — ABNORMAL LOW (ref 12.0–15.0)
MCH: 27 pg (ref 26.0–34.0)
MCHC: 30.1 g/dL (ref 30.0–36.0)
MCV: 89.8 fL (ref 80.0–100.0)
Platelets: 241 10*3/uL (ref 150–400)
RBC: 4.11 MIL/uL (ref 3.87–5.11)
RDW: 16.8 % — ABNORMAL HIGH (ref 11.5–15.5)
WBC: 9.7 10*3/uL (ref 4.0–10.5)
nRBC: 0 % (ref 0.0–0.2)

## 2021-12-15 NOTE — Progress Notes (Signed)
CSW spoke with Lavella Lemons at [facility] who states the facility cannot accept the patient.  CSW spoke with Juliann Pulse at [facility] who states the facility cannot accept the patient.  Madilyn Fireman, MSW, LCSW Transitions of Care  Clinical Social Worker II [phone]

## 2021-12-15 NOTE — ED Provider Notes (Signed)
Emergency Medicine Observation Re-evaluation Note  Beth Marshall is a 56 y.o. female, seen on rounds today.  Pt initially presented to the ED for complaints of Back Pain Currently, the patient is sleeping.  Physical Exam  BP (!) 149/67 (BP Location: Right Arm)   Pulse 77   Temp 98 F (36.7 C) (Oral)   Resp 18   Ht '5\' 6"'$  (1.676 m)   Wt (!) 185.5 kg   SpO2 97%   BMI 66.01 kg/m  Physical Exam General: Sleeping Cardiac: Extremities perfused Lungs: Unlabored breathing Psych: Deferred  ED Course / MDM  EKG:   I have reviewed the labs performed to date as well as medications administered while in observation.  Recent changes in the last 24 hours include none.  Plan  Current plan is for placement.    Godfrey Pick, MD 12/15/21 [phone]

## 2021-12-15 NOTE — ED Notes (Signed)
At patient request Perwick will be taken off for tonight to allow skin to breath due to found blisters on virgina and patient c/o uncomfort; Barrier cream applied-Monique,RN

## 2021-12-16 DIAGNOSIS — M545 Low back pain, unspecified: Secondary | ICD-10-CM | POA: Diagnosis not present

## 2021-12-16 LAB — PROTIME-INR
INR: 2 — ABNORMAL HIGH (ref 0.8–1.2)
Prothrombin Time: 22.9 seconds — ABNORMAL HIGH (ref 11.4–15.2)

## 2021-12-16 MED ORDER — WARFARIN SODIUM 10 MG PO TABS
10.0000 mg | ORAL_TABLET | Freq: Every day | ORAL | Status: DC
  Administered 2021-12-17 – 2022-02-04 (×48): 10 mg via ORAL
  Filled 2021-12-16 (×53): qty 1

## 2021-12-16 MED ORDER — WARFARIN SODIUM 6 MG PO TABS
12.0000 mg | ORAL_TABLET | Freq: Once | ORAL | Status: AC
  Administered 2021-12-16: 12 mg via ORAL
  Filled 2021-12-16: qty 2

## 2021-12-16 NOTE — Progress Notes (Signed)
[place] for Warfarin Indication:  paroxysmal A-fib and recurrent DVT history of factor V Leiden mutation and protein S deficiency  Allergies  Allergen Reactions   Cephalexin Other (See Comments)    Unknown reaction per [facility]    Chocolate Other (See Comments)    Unknown reaction per [facility]   Codeine Nausea And Vomiting   Diphenhydramine Hcl Itching   Ranitidine Rash    Patient Measurements: Height: '5\' 6"'$  (167.6 cm) Weight: (!) 185.5 kg (409 lb) IBW/kg (Calculated) : 59.3 Vital Signs: Temp: 97.8 F (36.6 C) (11/28 2129) Temp Source: Oral (11/28 2129) BP: 119/60 (11/28 2129) Pulse Rate: 64 (11/28 2129)  Labs: Recent Labs    12/14/21 0510 12/15/21 0436 12/16/21 0804  HGB  --  11.1*  --   HCT  --  36.9  --   PLT  --  241  --   LABPROT 26.9*  --  22.9*  INR 2.5*  --  2.0*     CrCl cannot be calculated (Patient's most recent lab result is older than the maximum 21 days allowed.).  Assessment: 56 yo F with a history of factor V leiden on warfarin, morbid obesity, bedbound status, recent left tib/fib. Patient presented with back pain, bilateral leg pain. Warfarin per pharmacy consult placed for  paroxysmal A-fib and recurrent DVT history of factor V Leiden mutation and protein S deficiency .  PTA dosing: 8.'5mg'$  daily except for '8mg'$  on Tue/Thur  INR at goal but low at 2.0 on '70mg'$ /week compared to PTA weekly dose of 58.'5mg'$ . CBC remains stable (last 11/28).  Goal of Therapy:  INR Goal 2-3 Monitor platelets by anticoagulation protocol: Yes   Plan:  Give warfarin 12 mg x1 at 1600 Monitor INR qMWF  Louanne Belton, PharmD, [facility] PGY1 Pharmacy Resident 12/16/2021 9:19 AM

## 2021-12-16 NOTE — Progress Notes (Signed)
Occupational Therapy Treatment Patient Details Name: Beth Marshall MRN: [id number] DOB: December 24, 1965 Today's Date: 12/16/2021   History of present illness Pt is a 56 y.o. female admitted from SNF rehab on 11/12/21 with back pain and x4 month and intermittent L leg pain, weakness, and numbness. Pt has been at [facility] for rehab since June secondary to chronic back pain s/p L tib fib fx (BLE WBAT). PMH includes OA, back pain, hx of DVT, edema, factor V leiden mutation, GERD, anxiety, hypokalemia, insomnia, intrinsic asthma, lipoma, migraine, obesity, tachycardia.   OT comments  Pt with minimal progress towards OT goals (updated accordingly) due to body habitus and BLE pain. Despite pain premedication, pt with reports of R LE pain > L LE today and only able to tolerate brief tilting to 40*. Encouraged weight shifting, mini squat attempts, and core exercises while tilting at 40* then at 20-25*. Will continue to follow acutely though progress potential approaching a plateau.    Recommendations for follow up therapy are one component of a multi-disciplinary discharge planning process, led by the attending physician.  Recommendations may be updated based on patient status, additional functional criteria and insurance authorization.    Follow Up Recommendations  Skilled nursing-short term rehab (<3 hours/day)     Assistance Recommended at Discharge Frequent or constant Supervision/Assistance  Patient can return home with the following  Two people to help with walking and/or transfers;Two people to help with bathing/dressing/bathroom   Equipment Recommendations  Hospital bed;Wheelchair cushion (measurements OT);Wheelchair (measurements OT);Other (comment) (hoyer lift)    Recommendations for Other Services      Precautions / Restrictions Precautions Precautions: Fall Precaution Comments: bladder/bowel incontinence Restrictions Weight Bearing Restrictions: No Other Position/Activity  Restrictions: confirmed WBAT BLE per Dr. Nechama Guard 11/14/21       Mobility Bed Mobility               General bed mobility comments: with initial light assist to scoot up to improve ability to reach headboard, pt able to pull self up and reposition    Transfers Overall transfer level: Needs assistance Equipment used:  (Tilt bed) Transfers: Sit to/from Stand             General transfer comment: faciliated tilting up to 40* today for < 3 min for weight shifting activities due to reported pain (R LE > LLE today). primarily stayed around 20-25* to minimize pt reported pain - encouraged knee flexion with pt also completing modified situps     Balance                                           ADL either performed or assessed with clinical judgement   ADL Overall ADL's : Needs assistance/impaired                             Toileting- Clothing Manipulation and Hygiene: Total assistance;Bed level         General ADL Comments: Extensive assist for LB ADLs bed level    Extremity/Trunk Assessment Upper Extremity Assessment Upper Extremity Assessment: Overall WFL for tasks assessed   Lower Extremity Assessment Lower Extremity Assessment: Defer to PT evaluation        Vision   Vision Assessment?: No apparent visual deficits   Perception     Praxis      Cognition Arousal/Alertness:  Awake/alert Behavior During Therapy: WFL for tasks assessed/performed, Restless Overall Cognitive Status: No family/caregiver present to determine baseline cognitive functioning                                 General Comments: likely at cognitive baseline, hyperverbose        Exercises Other Exercises Other Exercises: weight shifting, knee flexion/extension and sit ups with tilting    Shoulder Instructions       General Comments      Pertinent Vitals/ Pain       Pain Assessment Pain Assessment: Faces Faces Pain Scale: Hurts  little more Pain Location: L knee Pain Descriptors / Indicators: Sore Pain Intervention(s): Premedicated before session, Monitored during session, Limited activity within patient's tolerance  Home Living                                          Prior Functioning/Environment              Frequency  Min 1X/week        Progress Toward Goals  OT Goals(current goals can now be found in the care plan section)  Progress towards OT goals: OT to reassess next treatment  Acute Rehab OT Goals Patient Stated Goal: for pain to improve OT Goal Formulation: With patient Time For Goal Achievement: 12/28/21 Potential to Achieve Goals: [place] Discharge plan remains appropriate;Frequency needs to be updated    Co-evaluation                 AM-PAC OT "6 Clicks" Daily Activity     Outcome Measure   Help from another person eating meals?: None Help from another person taking care of personal grooming?: A Little Help from another person toileting, which includes using toliet, bedpan, or urinal?: Total Help from another person bathing (including washing, rinsing, drying)?: A Lot Help from another person to put on and taking off regular upper body clothing?: A Little Help from another person to put on and taking off regular lower body clothing?: Total 6 Click Score: 14    End of Session    OT Visit Diagnosis: Unsteadiness on feet (R26.81);Other abnormalities of gait and mobility (R26.89);Muscle weakness (generalized) (M62.81);Pain   Activity Tolerance Patient limited by pain   Patient Left in bed;with call bell/phone within reach   Nurse Communication          Time: 2426-8341 OT Time Calculation (min): 30 min  Charges: OT General Charges $OT Visit: 1 Visit OT Treatments $Therapeutic Activity: 23-37 mins  Malachy Chamber, OTR/L Acute Rehab Services Office: [phone]   Layla Maw 12/16/2021, 12:28 PM

## 2021-12-17 DIAGNOSIS — M545 Low back pain, unspecified: Secondary | ICD-10-CM | POA: Diagnosis not present

## 2021-12-17 NOTE — Progress Notes (Signed)
Physical Therapy Treatment Patient Details Name: Beth Marshall MRN: [id number] DOB: Dec 26, 1965 Today's Date: 12/17/2021   History of Present Illness Pt is a 56 y.o. female admitted from SNF rehab on 11/12/21 with back pain and x4 month and intermittent L leg pain, weakness, and numbness. Pt has been at [facility] for rehab since June secondary to chronic back pain s/p L tib fib fx (BLE WBAT). PMH includes OA, back pain, hx of DVT, edema, factor V leiden mutation, GERD, anxiety, hypokalemia, insomnia, intrinsic asthma, lipoma, migraine, morbid obesity, tachycardia.    PT Comments    Pt reports pain in LLE limiting her ability to participate. RN gave meds immediately prior to session. Pt able to scoot hips in bed initially then reported numbness in LLE and unable to shift hips. Pt performed limited tilt with pt stating numbness and pain of LLE. After return to supine pt able to shift hips again and reported returned of sensation to LLE. Pt educated for continued bil LE HEP and to move self in bed throughout the day. Will continue to follow.     Recommendations for follow up therapy are one component of a multi-disciplinary discharge planning process, led by the attending physician.  Recommendations may be updated based on patient status, additional functional criteria and insurance authorization.  Follow Up Recommendations  Skilled nursing-short term rehab (<3 hours/day) Can patient physically be transported by private vehicle: No   Assistance Recommended at Discharge Frequent or constant Supervision/Assistance  Patient can return home with the following Two people to help with walking and/or transfers;A lot of help with bathing/dressing/bathroom;Assistance with cooking/housework;Assist for transportation;Help with stairs or ramp for entrance   Equipment Recommendations  Other (comment) (defer to next venue)    Recommendations for Other Services       Precautions / Restrictions  Precautions Precautions: Fall Precaution Comments: bladder/bowel incontinence     Mobility  Bed Mobility Overal bed mobility: Needs Assistance Bed Mobility: Rolling Rolling: Supervision         General bed mobility comments: pt able to roll bil without physical assist with assist for linen change. Pt able to reach headboard and pull self up in trendelenburg and scoot hips side to side in bed without assist. Utilized bed positioning to transition to chair position and tilt    Transfers Overall transfer level: Needs assistance   Transfers: Sit to/from Stand Sit to Stand: Total assist, +2 safety/equipment           General transfer comment: tilt to 20 degrees for 8 min as pt reported left knee pain and would not tolerate further tilt. Tilted to 30 degrees briefly with pt immediately requesting return to supine. At 20 degree tilt pt able to bend and extend Rt knee and perform limited heel raise    Ambulation/Gait               General Gait Details: unable   Stairs             Wheelchair Mobility    Modified Rankin (Stroke Patients Only)       Balance                                            Cognition Arousal/Alertness: Awake/alert Behavior During Therapy: WFL for tasks assessed/performed Overall Cognitive Status: No family/caregiver present to determine baseline cognitive functioning  General Comments: self limiting at times        Exercises General Exercises - Lower Extremity Heel Slides: AROM, Right, 15 reps, Other (comment) (tilt to 20 degrees) Heel Raises: AROM, Right, 15 reps, Other (comment) (partial tilt to 20 degrees)    General Comments        Pertinent Vitals/Pain Pain Assessment Pain Score: 7  Pain Location: L knee Pain Descriptors / Indicators: Aching, Discomfort, Moaning Pain Intervention(s): Limited activity within patient's tolerance, RN gave pain meds during  session    Home Living                          Prior Function            PT Goals (current goals can now be found in the care plan section) Progress towards PT goals: Progressing toward goals (limited by pain)    Frequency    Min 2X/week      PT Plan Current plan remains appropriate    Co-evaluation              AM-PAC PT "6 Clicks" Mobility   Outcome Measure  Help needed turning from your back to your side while in a flat bed without using bedrails?: A Little Help needed moving from lying on your back to sitting on the side of a flat bed without using bedrails?: A Lot Help needed moving to and from a bed to a chair (including a wheelchair)?: Total Help needed standing up from a chair using your arms (e.g., wheelchair or bedside chair)?: Total Help needed to walk in hospital room?: Total Help needed climbing 3-5 steps with a railing? : Total 6 Click Score: 9    End of Session   Activity Tolerance: Patient limited by pain Patient left: in bed;with call bell/phone within reach Nurse Communication: Mobility status PT Visit Diagnosis: Muscle weakness (generalized) (M62.81);Difficulty in walking, not elsewhere classified (R26.2)     Time: 6803-2122 PT Time Calculation (min) (ACUTE ONLY): 30 min  Charges:  $Therapeutic Exercise: 8-22 mins $Therapeutic Activity: 8-22 mins                     Bayard Males, PT Acute Rehabilitation Services Office: [phone]    Sandy Salaam Liron Eissler 12/17/2021, 12:01 PM

## 2021-12-17 NOTE — ED Provider Notes (Signed)
Emergency Medicine Observation Re-evaluation Note  EMONII Beth Marshall is a 56 y.o. female, seen on rounds today.  Pt initially presented to the ED for complaints of Back Pain Currently, the patient is awake, reading her book. No complaints.  Physical Exam  BP 120/60 (BP Location: Right Arm)   Pulse 72   Temp 98.3 F (36.8 C) (Oral)   Resp 18   Ht '5\' 6"'$  (1.676 m)   Wt (!) 185.5 kg   SpO2 95%   BMI 66.01 kg/m  Physical Exam General: no distress Cardiac: regular rate Lungs: no resp distress Psych: alert  ED Course / MDM  EKG:   I have reviewed the labs performed to date as well as medications administered while in observation.  Recent changes in the last 24 hours include - 11-28 note states that pt has been declined at more facilities.  Plan  Current plan is for patient to remain social work hold.    Varney Biles, MD 12/17/21 0830

## 2021-12-17 NOTE — ED Notes (Signed)
Partial linen change provided. Patient provided with snack and drink. Patient repositioned in the bed. Updated on the plan of care

## 2021-12-18 DIAGNOSIS — M545 Low back pain, unspecified: Secondary | ICD-10-CM | POA: Diagnosis not present

## 2021-12-18 LAB — CBC
HCT: 31.5 % — ABNORMAL LOW (ref 36.0–46.0)
Hemoglobin: 10.1 g/dL — ABNORMAL LOW (ref 12.0–15.0)
MCH: 27.7 pg (ref 26.0–34.0)
MCHC: 32.1 g/dL (ref 30.0–36.0)
MCV: 86.3 fL (ref 80.0–100.0)
Platelets: 199 10*3/uL (ref 150–400)
RBC: 3.65 MIL/uL — ABNORMAL LOW (ref 3.87–5.11)
RDW: 16.8 % — ABNORMAL HIGH (ref 11.5–15.5)
WBC: 9 10*3/uL (ref 4.0–10.5)
nRBC: 0 % (ref 0.0–0.2)

## 2021-12-18 LAB — PROTIME-INR
INR: 2.2 — ABNORMAL HIGH (ref 0.8–1.2)
Prothrombin Time: 24.1 seconds — ABNORMAL HIGH (ref 11.4–15.2)

## 2021-12-18 NOTE — Progress Notes (Signed)
[place] for Warfarin Indication:  paroxysmal A-fib and recurrent DVT history of factor V Leiden mutation and protein S deficiency  Allergies  Allergen Reactions   Cephalexin Other (See Comments)    Unknown reaction per [facility]    Chocolate Other (See Comments)    Unknown reaction per [facility]   Codeine Nausea And Vomiting   Diphenhydramine Hcl Itching   Ranitidine Rash    Patient Measurements: Height: '5\' 6"'$  (167.6 cm) Weight: (!) 185.5 kg (409 lb) IBW/kg (Calculated) : 59.3 Vital Signs: Temp: 98 F (36.7 C) (11/30 2212) Temp Source: Oral (11/30 2212) BP: 130/58 (11/30 2212) Pulse Rate: 77 (11/30 2212)  Labs: Recent Labs    12/16/21 0804 12/18/21 0441  HGB  --  10.1*  HCT  --  31.5*  PLT  --  199  LABPROT 22.9* 24.1*  INR 2.0* 2.2*     CrCl cannot be calculated (Patient's most recent lab result is older than the maximum 21 days allowed.).  Assessment: 56 yo F with a history of factor V leiden on warfarin, morbid obesity, bedbound status, recent left tib/fib. Patient presented with back pain, bilateral leg pain. Warfarin per pharmacy consult placed for  paroxysmal A-fib and recurrent DVT history of factor V Leiden mutation and protein S deficiency .  PTA dosing: 8.'5mg'$  daily except for '8mg'$  on Tue/Thur  INR 2.2 on '70mg'$ /week compared to PTA weekly dose of 58.'5mg'$ . CBC remains stable today   Goal of Therapy:  INR Goal 2-3 Monitor platelets by anticoagulation protocol: Yes   Plan:  Continue warfarin '10mg'$  daily INR on MWF, s/s bleeding  Bertis Ruddy, PharmD, [place] Pharmacist ED Pharmacist Phone # [phone] 12/18/2021 8:03 AM

## 2021-12-18 NOTE — Progress Notes (Signed)
Physical Therapy Treatment Patient Details Name: Beth Marshall MRN: [id number] DOB: 09/23/1965 Today's Date: 12/18/2021   History of Present Illness Pt is a 56 y.o. female admitted from SNF rehab on 11/12/21 with back pain and x4 month and intermittent L leg pain, weakness, and numbness. Pt has been at [facility] for rehab since June secondary to chronic back pain s/p L tib fib fx (BLE WBAT). PMH includes OA, back pain, hx of DVT, edema, factor V leiden mutation, GERD, anxiety, hypokalemia, insomnia, intrinsic asthma, lipoma, migraine, morbid obesity, tachycardia.    PT Comments    Called to pt room due to discomfort in bed. Educated Therapist, sports and pt for expandable pad positioning and tilt functions. Pt stated feeling well and wanting to tilt with pt able to achieve 1 min at 45 degrees and 4 additional minutes 35-40. Pt would greatly benefit from nursing assist for daily tilting and continued bed level mobility and HEP. Plan appropriate.     Recommendations for follow up therapy are one component of a multi-disciplinary discharge planning process, led by the attending physician.  Recommendations may be updated based on patient status, additional functional criteria and insurance authorization.  Follow Up Recommendations  Skilled nursing-short term rehab (<3 hours/day) Can patient physically be transported by private vehicle: No   Assistance Recommended at Discharge Frequent or constant Supervision/Assistance  Patient can return home with the following Two people to help with walking and/or transfers;A lot of help with bathing/dressing/bathroom;Assistance with cooking/housework;Assist for transportation;Help with stairs or ramp for entrance   Equipment Recommendations  Other (comment) (defer to SNF)    Recommendations for Other Services       Precautions / Restrictions Precautions Precautions: Fall Precaution Comments: bladder/bowel incontinence     Mobility  Bed Mobility                General bed mobility comments: pt able to scoot to midline in bed without assist and slide toward HOB in trendelenburg without assist.    Transfers Overall transfer level: Needs assistance   Transfers: Sit to/from Stand Sit to Stand: Total assist           General transfer comment: tilt to 40 degrees for 3 min, 45 degrees 1 min and 35 degress 1 min with pt limited by pain. Pt able to perform 20 partial knee flexion/extensions while at 40 degrees- more of an isometric contraction than physical movement of the joint    Ambulation/Gait               General Gait Details: unable   Stairs             Wheelchair Mobility    Modified Rankin (Stroke Patients Only)       Balance                                            Cognition Arousal/Alertness: Awake/alert Behavior During Therapy: WFL for tasks assessed/performed Overall Cognitive Status: Within Functional Limits for tasks assessed                                 General Comments: self limiting at times        Exercises General Exercises - Lower Extremity Quad Sets: AROM, 20 reps, Standing, Both    General Comments  Pertinent Vitals/Pain Pain Assessment Pain Score: 3  Pain Location: L knee    Home Living                          Prior Function            PT Goals (current goals can now be found in the care plan section) Progress towards PT goals: Progressing toward goals    Frequency    Min 2X/week      PT Plan Current plan remains appropriate    Co-evaluation              AM-PAC PT "6 Clicks" Mobility   Outcome Measure  Help needed turning from your back to your side while in a flat bed without using bedrails?: A Little Help needed moving from lying on your back to sitting on the side of a flat bed without using bedrails?: A Lot Help needed moving to and from a bed to a chair (including a wheelchair)?:  Total Help needed standing up from a chair using your arms (e.g., wheelchair or bedside chair)?: Total Help needed to walk in hospital room?: Total Help needed climbing 3-5 steps with a railing? : Total 6 Click Score: 9    End of Session   Activity Tolerance: Patient limited by pain Patient left: in bed;with call bell/phone within reach;with nursing/sitter in room Nurse Communication: Mobility status PT Visit Diagnosis: Muscle weakness (generalized) (M62.81);Difficulty in walking, not elsewhere classified (R26.2)     Time: 0076-2263 PT Time Calculation (min) (ACUTE ONLY): 16 min  Charges:  $Therapeutic Activity: 8-22 mins                     Bayard Males, PT Acute Rehabilitation Services Office: [phone]    Sandy Salaam Percival Glasheen 12/18/2021, 1:44 PM

## 2021-12-18 NOTE — ED Notes (Signed)
Reached out to PT for assistance with bed.

## 2021-12-18 NOTE — Social Work (Signed)
CSW spoke to [place] from [place], Jackelyn Poling stated she was accepted but would have to see if they had the things to accomodates the patient due to the weight of the patient as they are not bariatric.  Jackelyn Poling stated that she will try to see what her nurse says, AM CSW will have to reach out in regards to update.The patient has been denied by 21 places thus far.

## 2021-12-18 NOTE — ED Provider Notes (Signed)
Emergency Medicine Observation Re-evaluation Note  Beth Marshall is a 56 y.o. female, seen on rounds today.  Pt initially presented to the ED for complaints of Back Pain Currently, the patient is resting.  Physical Exam  BP (!) 130/58 (BP Location: Right Arm)   Pulse 77   Temp 98 F (36.7 C) (Oral)   Resp 18   Ht 1.676 m ('5\' 6"'$ )   Wt (!) 185.5 kg   SpO2 95%   BMI 66.01 kg/m  Physical Exam  ED Course / MDM  EKG:   I have reviewed the labs performed to date as well as medications administered while in observation.  Recent changes in the last 24 hours include none   Plan  Current plan is for placement.    Lacretia Leigh, MD 12/18/21 [phone]

## 2021-12-18 NOTE — ED Notes (Signed)
Changed chx under this pt.

## 2021-12-19 DIAGNOSIS — M545 Low back pain, unspecified: Secondary | ICD-10-CM | POA: Diagnosis not present

## 2021-12-19 NOTE — ED Notes (Addendum)
Pt urinated on herself. [place] changed. Pericare given. Barrier cream applied to pt bottom.

## 2021-12-19 NOTE — ED Notes (Signed)
Received verbal report from Catherine W. RN at this time 

## 2021-12-19 NOTE — Progress Notes (Signed)
TOC CSW spoke with Debbie @ [facility] about speaking with DON.  Jackelyn Poling stated she would have to speak with admin on Monday (12/21/2021), and Jackelyn Poling will possibly do a bedside visit.  Priscille Shadduck Tarpley-Carter, MSW, LCSW-A Pronouns:  She/Her/Hers [facility] Care Clinical Social Worker Direct Number:  [phone] [email or web address]

## 2021-12-19 NOTE — Progress Notes (Signed)
TOC CSW spoke with Jackelyn Poling at [place] [phone].  Debbie stated she was waiting on DON to assess pt before making a decision.  DON usually works weekends, CSW should hear something today.  Markisha Meding Tarpley-Carter, MSW, LCSW-A Pronouns:  She/Her/Hers [facility] Clinical Social Worker Direct Number:  [phone] [email or web address]

## 2021-12-19 NOTE — Progress Notes (Addendum)
Pt would like TOC to inquire to [facility] on Monday (12/21/2021) about availability/acceptance.  Beth Marshall, MSW, LCSW-A Pronouns:  She/Her/Hers [facility] of Care Clinical Social Worker Direct Number:  [phone] [email or web address]

## 2021-12-19 NOTE — Progress Notes (Addendum)
TOC CSW provided pt, pts mother-Betty York and pts sister an update on placement.  Currently CSW is awaiting a return call from [place]:  [facility].  Manda Holstad Tarpley-Carter, MSW, LCSW-A Pronouns:  She/Her/Hers [facility] of Care Clinical Social Worker Direct Number:  [phone] [email or web address]

## 2021-12-19 NOTE — ED Notes (Signed)
Pt awake with daughter at bedside. Pt bed linens changed. No other need from pt or daughter.

## 2021-12-19 NOTE — ED Provider Notes (Signed)
Emergency Medicine Observation Re-evaluation Note  Beth Marshall is a 56 y.o. female, seen on rounds today.  Pt initially presented to the ED for complaints of Back Pain Currently, the patient is resting comfortably.  Physical Exam  BP 127/67   Pulse 78   Temp 98.2 F (36.8 C) (Oral)   Resp 16   Ht '5\' 6"'$  (1.676 m)   Wt (!) 185.5 kg   SpO2 96%   BMI 66.01 kg/m  Physical Exam Vitals and nursing note reviewed.  Constitutional:      General: She is not in acute distress.    Appearance: She is well-developed.  HENT:     Head: Normocephalic and atraumatic.  Eyes:     Conjunctiva/sclera: Conjunctivae normal.  Cardiovascular:     Rate and Rhythm: Normal rate and regular rhythm.     Heart sounds: No murmur heard. Pulmonary:     Effort: Pulmonary effort is normal. No respiratory distress.  Musculoskeletal:        General: No swelling.     Cervical back: Neck supple.  Skin:    General: Skin is warm and dry.     Capillary Refill: Capillary refill takes less than 2 seconds.  Neurological:     Mental Status: She is alert.  Psychiatric:        Mood and Affect: Mood normal.      ED Course / MDM  EKG:   I have reviewed the labs performed to date as well as medications administered while in observation.  Recent changes in the last 24 hours include continued social work evaluation.  Plan  Current plan is for eventual placement.    Teressa Lower, MD 12/19/21 [phone]

## 2021-12-19 NOTE — ED Notes (Signed)
Pt had BM. Pt bed pad changed. Pericare provided.

## 2021-12-19 NOTE — Progress Notes (Signed)
TOC CSW was asked to send pt to [facility] due to pts mom is a patient there.  Unfortunately, [facility] denied pt.  CSW will notify pt of this.  Nargis Abrams Tarpley-Carter, MSW, LCSW-A Pronouns:  She/Her/Hers [facility] Care Clinical Social Worker Direct Number:  [phone] [email or web address]

## 2021-12-20 DIAGNOSIS — M545 Low back pain, unspecified: Secondary | ICD-10-CM | POA: Diagnosis not present

## 2021-12-20 LAB — RESP PANEL BY RT-PCR (FLU A&B, COVID) ARPGX2
Influenza A by PCR: NEGATIVE
Influenza B by PCR: NEGATIVE
SARS Coronavirus 2 by RT PCR: NEGATIVE

## 2021-12-20 NOTE — ED Provider Notes (Addendum)
  Physical Exam  BP 111/66 (BP Location: Right Arm)   Pulse 83   Temp 98.2 F (36.8 C) (Oral)   Resp 16   Ht '5\' 6"'$  (1.676 m)   Wt (!) 185.5 kg   SpO2 98%   BMI 66.01 kg/m   Physical Exam  Procedures  Procedures  ED Course / MDM    Medical Decision Making Amount and/or Complexity of Data Reviewed Labs: ordered.  Risk Prescription drug management.  Pending placement  2 to nursing home.  Reportedly [facility] in [place] will be checked again tomorrow.  Reportedly has been denied at 21 different places already.  Physical therapy still recommends nursing home placement.  Patient was told nurse that her sister who has been visiting her was diagnosed with COVID and that patient had a little bit of a cough.  Will not get COVID flu and RSV testing.       Davonna Belling, MD 12/20/21 7654    Davonna Belling, MD 12/20/21 1028

## 2021-12-20 NOTE — ED Notes (Signed)
Pericare provided. Gown changed.

## 2021-12-21 DIAGNOSIS — M545 Low back pain, unspecified: Secondary | ICD-10-CM | POA: Diagnosis not present

## 2021-12-21 LAB — PROTIME-INR
INR: 2.3 — ABNORMAL HIGH (ref 0.8–1.2)
Prothrombin Time: 25.2 seconds — ABNORMAL HIGH (ref 11.4–15.2)

## 2021-12-21 LAB — CBC
HCT: 33.6 % — ABNORMAL LOW (ref 36.0–46.0)
Hemoglobin: 10.3 g/dL — ABNORMAL LOW (ref 12.0–15.0)
MCH: 27.2 pg (ref 26.0–34.0)
MCHC: 30.7 g/dL (ref 30.0–36.0)
MCV: 88.9 fL (ref 80.0–100.0)
Platelets: 226 10*3/uL (ref 150–400)
RBC: 3.78 MIL/uL — ABNORMAL LOW (ref 3.87–5.11)
RDW: 16.9 % — ABNORMAL HIGH (ref 11.5–15.5)
WBC: 8.3 10*3/uL (ref 4.0–10.5)
nRBC: 0 % (ref 0.0–0.2)

## 2021-12-21 NOTE — Progress Notes (Signed)
Debbie from [facility] stated she will be accepting pt and for CSW to start auth for [facility].  Chalene Treu Tarpley-Carter, MSW, LCSW-A Pronouns:  She/Her/Hers [facility] Health[facility] of Care Clinical Social Worker Direct Number:  [phone] Mileah [email or web address]

## 2021-12-21 NOTE — Progress Notes (Signed)
TOC CSW attempted to contact Kitty at [facility] with no answer.  CSW left HIPPA compliant message with my contact information.   Laureen Frederic Tarpley-Carter, MSW, LCSW-A Pronouns:  She/Her/Hers [facility] Health[facility] of Care Clinical Social Worker Direct Number:  [phone] [email or web address]

## 2021-12-21 NOTE — Progress Notes (Signed)
[place] for Warfarin Indication:  paroxysmal A-fib and recurrent DVT history of factor V Leiden mutation and protein S deficiency  Allergies  Allergen Reactions   Cephalexin Other (See Comments)    Unknown reaction per MAR    Chocolate Other (See Comments)    Unknown reaction per [facility]   Codeine Nausea And Vomiting   Diphenhydramine Hcl Itching   Ranitidine Rash    Patient Measurements: Height: '5\' 6"'$  (167.6 cm) Weight: (!) 185.5 kg (409 lb) IBW/kg (Calculated) : 59.3 Vital Signs:    Labs: Recent Labs    12/21/21 0515  HGB 10.3*  HCT 33.6*  PLT 226  LABPROT 25.2*  INR 2.3*     CrCl cannot be calculated (Patient's most recent lab result is older than the maximum 21 days allowed.).  Assessment: 56 yo F with a history of factor V leiden on warfarin, morbid obesity, bedbound status, recent left tib/fib. Patient presented with back pain, bilateral leg pain. Warfarin per pharmacy consult placed for  paroxysmal A-fib and recurrent DVT history of factor V Leiden mutation and protein S deficiency .  PTA dosing: 8.'5mg'$  daily except for '8mg'$  on Tue/Thur  INR 2.3 on '70mg'$ /week compared to PTA weekly dose of 58.'5mg'$ . CBC remains stable today   Goal of Therapy:  INR Goal 2-3 Monitor platelets by anticoagulation protocol: Yes   Plan:  Continue warfarin '10mg'$  daily INR on MWF, s/s bleeding  Cristela Felt, PharmD, BCPS Clinical Pharmacist 12/21/2021 8:47 AM

## 2021-12-21 NOTE — NC FL2 (Signed)
[place] LEVEL OF CARE FORM     IDENTIFICATION  Patient Name: Beth Marshall Birthdate: 1965/05/19 Sex: female Admission Date (Current Location): 11/13/2021  [place] and [place] Number:  Oval Linsey [id number] [place] and Address:  [facility] [place], [place] [address] [place], [place] [address]      Provider Number: [id number]  Attending Physician Name and Address:  Default, Provider, MD  Relative Name and Phone Number:       Current Level of Care: Hospital Recommended Level of Care: [place] Prior Approval Number:    Date Approved/Denied:   PASRR Number:    Discharge Plan: Home    Current Diagnoses: Patient Active Problem List   Diagnosis Date Noted   Rectus sheath hematoma, initial encounter 06/23/2021   Acute postoperative anemia due to expected blood loss 06/22/2021   Vitamin D deficiency 06/22/2021   Right foot sprain, initial encounter 06/21/2021   Ankle fracture 06/19/2021   Tibia/fibula fracture, left, closed, initial encounter 06/19/2021   Fracture of lower extremity 06/19/2021   Atrial fibrillation ([place]) 05/06/2021   Moderate episode of recurrent major depressive disorder ([place]) 08/02/2018   History of DVT (deep vein thrombosis) 03/15/2018   Primary osteoarthritis of both knees 08/30/2017   Arthritis of right knee 12/20/2016   Chronic deep vein thrombosis (DVT) of lower extremity ([place]) 10/27/2015   Encounter for screening mammogram for breast cancer 10/27/2015   Factor V Leiden mutation ([place]) 10/27/2015   High risk medication use 07/25/2015   Migraine without aura and without status migrainosus, not intractable 06/25/2015   Hypokalemia 05/16/2015   Primary osteoarthritis of left knee 04/22/2015   Elevated blood pressure reading 03/20/2015   Long term (current) use of anticoagulants 03/20/2015   Edema of extremities 02/18/2015   Dizziness 02/18/2015   Back pain 02/18/2015   Abnormal coagulation profile  02/18/2015   Generalized anxiety disorder 02/18/2015   Gastroesophageal reflux disease 02/18/2015   Lesion of ovary 02/18/2015   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   Insomnia 02/18/2015   Impaired fasting glucose 02/18/2015   OSA (obstructive sleep apnea) 02/18/2015   Obesity, morbid, BMI 50 or higher ([place]) 02/18/2015   Lipoma 02/18/2015   Shortness of breath 02/18/2015   Protein S deficiency ([place]) 02/18/2015   Osteoarthritis 02/18/2015   Calculus of gallbladder with cholecystitis and obstruction 06/23/2014    Orientation RESPIRATION BLADDER Height & Weight     Self, Time, Situation, Place  Normal Incontinent Weight: (!) 409 lb (185.5 kg) Height:  '5\' 6"'$  (167.6 cm)  BEHAVIORAL SYMPTOMS/MOOD NEUROLOGICAL BOWEL NUTRITION STATUS      Incontinent Diet (Heart Healthy)  AMBULATORY STATUS COMMUNICATION OF NEEDS Skin   Total Care Verbally                         Personal Care Assistance Level of Assistance  Bathing, Feeding, Dressing, Total care Bathing Assistance: Maximum assistance Feeding assistance: Maximum assistance Dressing Assistance: Maximum assistance Total Care Assistance: Maximum assistance   Functional Limitations Info  Sight, Hearing, Speech Sight Info: Adequate (Glasses) Hearing Info: Adequate Speech Info: Adequate    SPECIAL CARE FACTORS FREQUENCY  PT (By licensed PT), OT (By licensed OT)     PT Frequency: 5x weekly OT Frequency: 5x weekly            Contractures Contractures Info: Not present    Additional Factors Info  Code Status, Allergies Code Status Info: Full Code Allergies Info: Cephalexin   Codeine  Diphenhydramine Hcl   Ranitidine           Current Medications (12/21/2021):  This is the current hospital active medication list Current Facility-Administered Medications  Medication Dose Route Frequency Provider Last Rate Last Admin   acetaminophen (TYLENOL) tablet 650 mg  650 mg Oral Q6H PRN Elgie Congo, MD   650 mg at 12/03/21 0517    cyclobenzaprine (FLEXERIL) tablet 5 mg  5 mg Oral TID PRN Elgie Congo, MD   5 mg at 12/20/21 2117   diclofenac Sodium (VOLTAREN) 1 % topical gel 2 g  2 g Topical Q8H PRN Elgie Congo, MD       furosemide (LASIX) tablet 40 mg  40 mg Oral Daily Georgina Snell C, MD   40 mg at 12/21/21 4782   hydrocortisone cream 1 %   Topical BID Elgie Congo, MD   Given at 12/21/21 0954   loratadine (CLARITIN) tablet 10 mg  10 mg Oral Daily Elgie Congo, MD   10 mg at 12/21/21 9562   melatonin tablet 3 mg  3 mg Oral Daily PRN Elgie Congo, MD   3 mg at 12/20/21 2124   menthol-cetylpyridinium (CEPACOL) lozenge 3 mg  1 lozenge Oral PRN Kemper Durie, DO       mometasone-formoterol (DULERA) 200-5 MCG/ACT inhaler 2 puff  2 puff Inhalation BID Elgie Congo, MD   2 puff at 12/21/21 0954   montelukast (SINGULAIR) tablet 10 mg  10 mg Oral QHS Bell, Lorin C, RPH   10 mg at 12/20/21 2117   nystatin (MYCOSTATIN/NYSTOP) topical powder   Topical PRN Carmin Muskrat, MD   Given at 12/11/21 2115   oxyCODONE-acetaminophen (PERCOCET/ROXICET) 5-325 MG per tablet 1-2 tablet  1-2 tablet Oral Q4H PRN Elgie Congo, MD   2 tablet at 12/20/21 1257   pantoprazole (PROTONIX) EC tablet 40 mg  40 mg Oral Daily Georgina Snell C, MD   40 mg at 12/21/21 0953   polyethylene glycol (MIRALAX / GLYCOLAX) packet 17 g  17 g Oral Daily PRN Elgie Congo, MD       potassium chloride (KLOR-CON M) CR tablet 10 mEq  10 mEq Oral BID Georgina Snell C, MD   10 mEq at 12/21/21 0953   venlafaxine XR (EFFEXOR-XR) 24 hr capsule 75 mg  75 mg Oral Q breakfast Elgie Congo, MD   75 mg at 12/21/21 1308   Vitamin D (Ergocalciferol) (DRISDOL) 1.25 MG (50000 UNIT) capsule 50,000 Units  50,000 Units Oral Q7 days Elgie Congo, MD   50,000 Units at 12/19/21 1056   warfarin (COUMADIN) tablet 10 mg  10 mg Oral q1600 Louanne Belton, RPH   10 mg at 12/21/21 1736   Warfarin - Pharmacist  Dosing Inpatient   Does not apply q1600 Tegeler, Gwenyth Allegra, MD   Given at 12/20/21 1805   Current Outpatient Medications  Medication Sig Dispense Refill   acetaminophen (TYLENOL) 650 MG CR tablet Take 650 mg by mouth every 6 (six) hours as needed for pain.     albuterol (PROVENTIL HFA;VENTOLIN HFA) 108 (90 BASE) MCG/ACT inhaler Inhale 1 puff into the lungs every 6 (six) hours as needed for wheezing or shortness of breath.     BELSOMRA 10 MG TABS Take 10 mg by mouth daily as needed (for insomnia).     budesonide-formoterol (SYMBICORT) 160-4.5 MCG/ACT inhaler Inhale 2 puffs into the lungs.     calcipotriene (DOVONOX) 0.005 % cream Apply 1  Application topically 2 (two) times daily. Apply to affected areas twice a day for psoriasis     cetirizine (ZYRTEC) 10 MG tablet Take 10 mg by mouth.     cyclobenzaprine (FLEXERIL) 5 MG tablet Take 5 mg by mouth 3 (three) times daily as needed for muscle spasms.     diclofenac Sodium (VOLTAREN) 1 % GEL Apply 2 g topically every 8 (eight) hours as needed (for pain).     furosemide (LASIX) 40 MG tablet Take 40 mg by mouth.     montelukast (SINGULAIR) 10 MG tablet Take 10 mg by mouth daily at 12 noon.     oxyCODONE-acetaminophen (PERCOCET/ROXICET) 5-325 MG tablet Take 1-2 tablets by mouth every 4 (four) hours as needed for severe pain (1 tablet moderate pain, 2 tablets severe pain). 60 tablet 0   pantoprazole (PROTONIX) 40 MG tablet Take 1 tablet (40 mg total) by mouth daily. 30 tablet 0   polyethylene glycol (MIRALAX / GLYCOLAX) 17 g packet Take 17 g by mouth daily as needed for mild constipation. 14 each 0   Potassium Chloride 10 MEQ PACK Take 10 mEq by mouth 2 (two) times daily.     SUMAtriptan (IMITREX) 100 MG tablet Take 1 tablet by mouth every 2 (two) hours as needed for migraine.     venlafaxine XR (EFFEXOR-XR) 75 MG 24 hr capsule Take 75 mg by mouth daily with breakfast.     Vitamin D, Ergocalciferol, (DRISDOL) 1.25 MG (50000 UNIT) CAPS capsule Take 1  capsule (50,000 Units total) by mouth every 7 (seven) days. (Patient taking differently: Take 50,000 Units by mouth every 7 (seven) days. Every Saturday) 5 capsule 0   warfarin (COUMADIN) 1 MG tablet Take 1 mg by mouth See admin instructions. Take with 7.'5mg'$  tablet for a total dose of 8.'5mg'$  once every Sun, Mon, Wed, Fri, Sat     warfarin (COUMADIN) 4 MG tablet Take 8 mg by mouth 2 (two) times a week. Take 2 tablets by mouth once every Tuesday and Thursday     warfarin (COUMADIN) 7.5 MG tablet Take 7.5 mg by mouth See admin instructions. Take with '1mg'$  tablet for a total dose of 8.'5mg'$  once every Sun, Mon, Wed, Fri, Sat       Discharge Medications: Please see discharge summary for a list of discharge medications.  Relevant Imaging Results:  Relevant Lab Results:   Additional Information SSN: [id number]  Valton Schwartz C Tarpley-Carter, LCSWA

## 2021-12-21 NOTE — Progress Notes (Signed)
Pts insurance [name] has been started through [place].  Bernadene Bell:  [id number]  Beth Marshall, MSW, LCSW-A Pronouns:  She/Her/Hers [facility] HealthTransitions of Care Clinical Social Worker Direct Number:  [phone] [email or web address]

## 2021-12-21 NOTE — ED Provider Notes (Signed)
Emergency Medicine Observation Re-evaluation Note  Beth Marshall is a 56 y.o. female, seen on rounds today.  Pt initially presented to the ED for complaints of Back Pain Currently, the patient is patient is sitting in bed.  She has no acute complaints today.  Physical Exam  BP 119/63 (BP Location: Right Wrist)   Pulse 77   Temp 98.5 F (36.9 C) (Oral)   Resp 18   Ht 1.676 m ('5\' 6"'$ )   Wt (!) 185.5 kg   SpO2 99%   BMI 66.01 kg/m  Physical Exam   ED Course / MDM  EKG:   I have reviewed the labs performed to date as well as medications administered while in observation.  Recent changes in the last 24 hours include none.  Plan  Current plan is for placement.    Lacretia Leigh, MD 12/21/21 1038

## 2021-12-21 NOTE — ED Notes (Signed)
Patient asked for her pads to be changed due to urine incontinence. Patient was cleaned and is resting in bed.

## 2021-12-21 NOTE — ED Notes (Signed)
Patient is asleep in bed

## 2021-12-21 NOTE — Progress Notes (Signed)
TOC CSW attempted to contact Debbie at [facility] with no answer.  CSW left HIPPA compliant message with my contact information.   Bolden Hagerman Tarpley-Carter, MSW, LCSW-A Pronouns:  She/Her/Hers [facility] Health[facility] of Care Clinical Social Worker Direct Number:  [phone] Sachiko [email or web address]

## 2021-12-22 DIAGNOSIS — M545 Low back pain, unspecified: Secondary | ICD-10-CM | POA: Diagnosis not present

## 2021-12-22 NOTE — Progress Notes (Addendum)
Patient's insurance authorization is still pending at this time.  CSW spoke with Jackelyn Poling at [facility] to provide her with an update on authorization status.  Madilyn Fireman, MSW, LCSW Transitions of Care  Clinical Social Worker II [phone]

## 2021-12-22 NOTE — ED Provider Notes (Signed)
Emergency Medicine Observation Re-evaluation Note  Beth Marshall is a 56 y.o. female, seen on rounds today.  Pt initially presented to the ED for complaints of Back Pain Currently, the patient is resting.  Physical Exam  BP 130/77   Pulse 81   Temp 97.8 F (36.6 C) (Oral)   Resp 18   Ht '5\' 6"'$  (1.676 m)   Wt (!) 185.5 kg   SpO2 100%   BMI 66.01 kg/m  Physical Exam General: nad Psych: calm  ED Course / MDM  EKG:   I have reviewed the labs performed to date as well as medications administered while in observation.  Recent changes in the last 24 hours include SW ongoing eval. Pt potentially to be sent to [facility].  Plan  Current plan is for placement.    Jeanell Sparrow, DO 12/22/21 [phone]

## 2021-12-22 NOTE — Progress Notes (Signed)
Physical Therapy Treatment Patient Details Name: Beth Marshall MRN: [id number] DOB: 1966/01/16 Today's Date: 12/22/2021   History of Present Illness Pt is a 56 y.o. female admitted from SNF rehab on 11/12/21 with back pain and x4 month and intermittent L leg pain, weakness, and numbness. Pt has been at [facility] for rehab since June secondary to chronic back pain s/p L tib fib fx (BLE WBAT). PMH includes OA, back pain, hx of DVT, edema, factor V leiden mutation, GERD, anxiety, hypokalemia, insomnia, intrinsic asthma, lipoma, migraine, morbid obesity, tachycardia.    PT Comments    Pt able to tolerate 11 min of total tilt time today with 50 degrees max tolerance of 90 sec. Pt benefits from distraction of bil UE HEp during tilting to not focus on main in joints with weight bearing. Pt educated for continued bil UE and LE HEP, chair position during the day and further tilting tolerance and increased degrees to maximize strength for standing from EOB.     Recommendations for follow up therapy are one component of a multi-disciplinary discharge planning process, led by the attending physician.  Recommendations may be updated based on patient status, additional functional criteria and insurance authorization.  Follow Up Recommendations  Skilled nursing-short term rehab (<3 hours/day) Can patient physically be transported by private vehicle: No   Assistance Recommended at Discharge Frequent or constant Supervision/Assistance  Patient can return home with the following Two people to help with walking and/or transfers;A lot of help with bathing/dressing/bathroom;Assistance with cooking/housework;Assist for transportation;Help with stairs or ramp for entrance   Equipment Recommendations  Other (comment) (defer to SNF)    Recommendations for Other Services       Precautions / Restrictions Precautions Precautions: Fall Precaution Comments: bladder/bowel incontinence     Mobility  Bed  Mobility   Bed Mobility: Rolling Rolling: Supervision         General bed mobility comments: pt able to scoot to midline and roll toward left without assist with use of rail. Pt also sliding toward HOB in trendelenburg with cues    Transfers Overall transfer level: Needs assistance   Transfers: Sit to/from Stand Sit to Stand: Total assist           General transfer comment: tilt to 50 degrees for 90 sec, 45 degrees 70mn, 35 degrees 158m, 30 degrees 8 min. isometric quad sets in standing at 35 degrees    Ambulation/Gait               General Gait Details: unable   Stairs             Wheelchair Mobility    Modified Rankin (Stroke Patients Only)       Balance                                            Cognition Arousal/Alertness: Awake/alert Behavior During Therapy: WFL for tasks assessed/performed Overall Cognitive Status: Within Functional Limits for tasks assessed                                 General Comments: self limiting at times        Exercises General Exercises - Upper Extremity Shoulder Flexion: AROM, Both, Standing, Other reps (comment) (30 reps) Theraband Level (Shoulder Flexion): Level 4 (Blue) Theraband Level (Shoulder Horizontal  Abduction): Level 4 (Blue) Shoulder Horizontal ADduction: AROM, Both, Standing, Other reps (comment) (30 reps) Elbow Extension: AROM, Standing, Both, Other reps (comment) (30 reps) Theraband Level (Elbow Extension): Level 4 (Blue) General Exercises - Lower Extremity Quad Sets: AROM, Both, Standing, 10 reps    General Comments        Pertinent Vitals/Pain Pain Assessment Pain Score: 3  Pain Location: L knee Pain Descriptors / Indicators: Aching, Discomfort Pain Intervention(s): Limited activity within patient's tolerance, Premedicated before session, Monitored during session, Repositioned    Home Living                          Prior Function             PT Goals (current goals can now be found in the care plan section) Acute Rehab PT Goals Potential to Achieve Goals: Fair Progress towards PT goals: Progressing toward goals    Frequency    Min 2X/week      PT Plan Current plan remains appropriate    Co-evaluation              AM-PAC PT "6 Clicks" Mobility   Outcome Measure  Help needed turning from your back to your side while in a flat bed without using bedrails?: A Little Help needed moving from lying on your back to sitting on the side of a flat bed without using bedrails?: A Lot Help needed moving to and from a bed to a chair (including a wheelchair)?: Total Help needed standing up from a chair using your arms (e.g., wheelchair or bedside chair)?: Total Help needed to walk in hospital room?: Total Help needed climbing 3-5 steps with a railing? : Total 6 Click Score: 9    End of Session   Activity Tolerance: Patient tolerated treatment well Patient left: in bed;with call bell/phone within reach Nurse Communication: Mobility status PT Visit Diagnosis: Muscle weakness (generalized) (M62.81);Difficulty in walking, not elsewhere classified (R26.2)     Time: 1443-1540 PT Time Calculation (min) (ACUTE ONLY): 31 min  Charges:  $Therapeutic Exercise: 8-22 mins $Therapeutic Activity: 8-22 mins                     Bayard Males, PT Acute Rehabilitation Services Office: [phone]    Beth Marshall 12/22/2021, 1:26 PM

## 2021-12-22 NOTE — ED Notes (Signed)
Changed patient placed another pad

## 2021-12-23 DIAGNOSIS — M545 Low back pain, unspecified: Secondary | ICD-10-CM | POA: Diagnosis not present

## 2021-12-23 LAB — PROTIME-INR
INR: 2.1 — ABNORMAL HIGH (ref 0.8–1.2)
Prothrombin Time: 23.6 seconds — ABNORMAL HIGH (ref 11.4–15.2)

## 2021-12-23 NOTE — Progress Notes (Signed)
TOC CSW spoke with Melody Haver, Chaplain about pt wanting to change her POA from her step-father.  Pts step-father is not able to make decisions any longer per pt.  Tye [place] stated she would speak with pt.  Anthonio Mizzell Tarpley-Carter, MSW, LCSW-A Pronouns:  She/Her/Hers [facility] HealthTransitions of Care Clinical Social Worker Direct Number:  [phone] [email or web address]

## 2021-12-23 NOTE — ED Provider Notes (Signed)
Emergency Medicine Observation Re-evaluation Note  Beth Marshall is a 56 y.o. female, seen on rounds today.  Pt initially presented to the ED for complaints of Back Pain Currently, the patient is nursing home placement.  Physical Exam  BP 132/77 (BP Location: Right Wrist)   Pulse 74   Temp 98.2 F (36.8 C) (Oral)   Resp 17   Ht '5\' 6"'$  (1.676 m)   Wt (!) 185.5 kg   SpO2 100%   BMI 66.01 kg/m  Physical Exam Patient alert in no acute distress  ED Course / MDM  EKG:   I have reviewed the labs performed to date as well as medications administered while in observation.  Recent changes in the last 24 hours include none.  Plan  Current plan is for nursing home placement .    Milton Ferguson, MD 12/23/21 1039

## 2021-12-23 NOTE — Progress Notes (Addendum)
2:30pm: CSW spoke with [place] at [facility] who states the patient's insurance authorizatoin request has been sent to the Medical Director for further review.  11:20am: Insurance authorization is still pending at this time.  9:40am: CSW uploaded additional PT notes to [facility] for review.  Madilyn Fireman, MSW, LCSW Transitions of Care  Clinical Social Worker II [phone]

## 2021-12-23 NOTE — ED Notes (Signed)
Pt requesting a Chaplin visit in the AM.

## 2021-12-23 NOTE — Progress Notes (Signed)
[place] for Warfarin Indication:  paroxysmal A-fib and recurrent DVT history of factor V Leiden mutation and protein S deficiency  Allergies  Allergen Reactions   Cephalexin Other (See Comments)    Unknown reaction per [facility]    Chocolate Other (See Comments)    Unknown reaction per [facility]   Codeine Nausea And Vomiting   Diphenhydramine Hcl Itching   Ranitidine Rash    Patient Measurements: Height: '5\' 6"'$  (167.6 cm) Weight: (!) 185.5 kg (409 lb) IBW/kg (Calculated) : 59.3 Vital Signs: Temp: 98.2 F (36.8 C) (12/06 0746) Temp Source: Oral (12/06 0746) BP: 132/77 (12/06 0746) Pulse Rate: 74 (12/06 0746)  Labs: Recent Labs    12/21/21 0515 12/23/21 1205  HGB 10.3*  --   HCT 33.6*  --   PLT 226  --   LABPROT 25.2* 23.6*  INR 2.3* 2.1*     CrCl cannot be calculated (Patient's most recent lab result is older than the maximum 21 days allowed.).  Assessment: 56 yo F with a history of factor V leiden on warfarin, morbid obesity, bedbound status, recent left tib/fib. Patient presented with back pain, bilateral leg pain. Warfarin per pharmacy consult placed for  paroxysmal A-fib and recurrent DVT history of factor V Leiden mutation and protein S deficiency .  PTA dosing: 8.'5mg'$  daily except for '8mg'$  on Tue/Thur  INR 2.1 on '70mg'$ /week compared to PTA weekly dose of 58.'5mg'$ . CBC remains stable today   Goal of Therapy:  INR Goal 2-3 Monitor platelets by anticoagulation protocol: Yes   Plan:  Continue warfarin '10mg'$  daily INR on MWF, s/s bleeding  Salome Arnt, PharmD, BCPS, BCEMP Clinical Pharmacist Please see AMION for all pharmacy numbers 12/23/2021 12:32 PM

## 2021-12-24 DIAGNOSIS — M545 Low back pain, unspecified: Secondary | ICD-10-CM | POA: Diagnosis not present

## 2021-12-24 LAB — CBC
HCT: 39.2 % (ref 36.0–46.0)
Hemoglobin: 12 g/dL (ref 12.0–15.0)
MCH: 27.1 pg (ref 26.0–34.0)
MCHC: 30.6 g/dL (ref 30.0–36.0)
MCV: 88.7 fL (ref 80.0–100.0)
Platelets: 252 10*3/uL (ref 150–400)
RBC: 4.42 MIL/uL (ref 3.87–5.11)
RDW: 16.7 % — ABNORMAL HIGH (ref 11.5–15.5)
WBC: 8.4 10*3/uL (ref 4.0–10.5)
nRBC: 0.2 % (ref 0.0–0.2)

## 2021-12-24 NOTE — ED Provider Notes (Signed)
Emergency Medicine Observation Re-evaluation Note  Beth Marshall is a 56 y.o. female, seen on rounds today.  Pt initially presented to the ED for complaints of chronic back pain, morbid obesity and physical deconditioning that impairs mobility and attendance to ADLs independently.  Pt waiting for SNF placement.   Physical Exam  BP 128/78   Pulse 80   Temp 98.5 F (36.9 C)   Resp 18   Ht 1.676 m ('5\' 6"'$ )   Wt (!) 185.5 kg   SpO2 99%   BMI 66.01 kg/m  Physical Exam General: resting comfortably. Cardiac: regular rate. Lungs: breathing comfortably   ED Course / MDM   I have reviewed the labs performed to date as well as medications administered while in observation.  Recent changes in the last 24 hours include ED obs, reassessment, TOC eval and placement.   Plan  Current plan is for SNF placement ?[facility] - unclear why pt has not yet moved to facility - will discuss w TOC this AM.     Lajean Saver, MD 12/24/21 [phone]

## 2021-12-24 NOTE — ED Notes (Signed)
Help get patient changed placed another pad patient is resting with call bell in reach

## 2021-12-24 NOTE — ED Notes (Signed)
Pt has some redness are arms and noticed it after orange tourniquet was used. Hydrocortisone cream applied to pts arms.

## 2021-12-24 NOTE — ED Notes (Signed)
Unable to obtain lab work due to unsuccessful stick. Will have phlebotomy to retry.

## 2021-12-24 NOTE — Progress Notes (Signed)
Occupational Therapy Treatment Patient Details Name: Beth Marshall MRN: [id number] DOB: [birth year] Today's Date: 12/24/2021   History of present illness Pt is a 56 y.o. female admitted from SNF rehab on 11/12/21 with back pain and x4 month and intermittent L leg pain, weakness, and numbness. Pt has been at [facility] for rehab since June secondary to chronic back pain s/p L tib fib fx (BLE WBAT). PMH includes OA, back pain, hx of DVT, edema, factor V leiden mutation, GERD, anxiety, hypokalemia, insomnia, intrinsic asthma, lipoma, migraine, morbid obesity, tachycardia.   OT comments  Pt making gradual progress towards OT goals with focus on progression of tilting and WB through BLE to maximize ability to stand and assist with LB ADLs. Pt able to tolerate tilting to 50* for 15 min while completing UE HEP w/ theraband, stretches and weight shifting activities. Pt premedicated 1 hour prior to session with only minor complaints of back pain that improved with exercises. Continue to rec SNF rehab based on current physical assist needed for basic ADLs and lack of caregiver support at DC.   Recommendations for follow up therapy are one component of a multi-disciplinary discharge planning process, led by the attending physician.  Recommendations may be updated based on patient status, additional functional criteria and insurance authorization.    Follow Up Recommendations  Skilled nursing-short term rehab (<3 hours/day)     Assistance Recommended at Discharge Frequent or constant Supervision/Assistance  Patient can return home with the following  Two people to help with walking and/or transfers;Two people to help with bathing/dressing/bathroom   Equipment Recommendations  Hospital bed;Wheelchair cushion (measurements OT);Wheelchair (measurements OT);Other (comment) (hoyer lift)    Recommendations for Other Services      Precautions / Restrictions Precautions Precautions: Fall Precaution  Comments: bladder/bowel incontinence Restrictions Weight Bearing Restrictions: No       Mobility Bed Mobility Overal bed mobility: Needs Assistance             General bed mobility comments: able to use bed controls and bedrails to scoot self up without assist    Transfers                   General transfer comment: tilt to 50 degrees for 15 min w/ completion of various UE HEP, stretches and weight shifitng attempts     Balance                                           ADL either performed or assessed with clinical judgement   ADL Overall ADL's : Needs assistance/impaired                     Lower Body Dressing: Total assistance;Bed level                 General ADL Comments: Emphasis on tilting tolerance, WB through BLE and weight shifting, UE HEP completion and various stretches while tilting    Extremity/Trunk Assessment Upper Extremity Assessment Upper Extremity Assessment: Overall WFL for tasks assessed   Lower Extremity Assessment Lower Extremity Assessment: Defer to PT evaluation        Vision   Vision Assessment?: No apparent visual deficits   Perception     Praxis      Cognition Arousal/Alertness: Awake/alert Behavior During Therapy: WFL for tasks assessed/performed Overall Cognitive Status: Within Functional Limits for tasks assessed  General Comments: self limiting at times, hyperverbose        Exercises Exercises: General Upper Extremity, Other exercises General Exercises - Upper Extremity Shoulder Flexion: Strengthening, Both, 20 reps, Theraband Theraband Level (Shoulder Flexion): Level 4 (Blue) Shoulder Horizontal ABduction: Strengthening, Both, Theraband, 20 reps Theraband Level (Shoulder Horizontal Abduction): Level 4 (Blue) Elbow Flexion: Strengthening, Both, 20 reps, Theraband Theraband Level (Elbow Flexion): Level 4 (Blue) Elbow Extension:  Both, Strengthening, 20 reps, Theraband Theraband Level (Elbow Extension): Level 4 (Blue) Other Exercises Other Exercises: weight shifting, lifting toe/heel    Shoulder Instructions       General Comments      Pertinent Vitals/ Pain       Pain Assessment Pain Assessment: Faces Faces Pain Scale: Hurts a little bit Pain Location: back Pain Descriptors / Indicators: Grimacing, Guarding Pain Intervention(s): Monitored during session, Premedicated before session  Home Living                                          Prior Functioning/Environment              Frequency  Min 1X/week        Progress Toward Goals  OT Goals(current goals can now be found in the care plan section)  Progress towards OT goals: Progressing toward goals  Acute Rehab OT Goals Patient Stated Goal: be able to transfer into a wheelchair, try standing with RW next time OT Goal Formulation: With patient Time For Goal Achievement: 12/28/21 Potential to Achieve Goals: [place] Discharge plan remains appropriate    Co-evaluation                 AM-PAC OT "6 Clicks" Daily Activity     Outcome Measure   Help from another person eating meals?: None Help from another person taking care of personal grooming?: A Little Help from another person toileting, which includes using toliet, bedpan, or urinal?: Total Help from another person bathing (including washing, rinsing, drying)?: A Lot Help from another person to put on and taking off regular upper body clothing?: A Little Help from another person to put on and taking off regular lower body clothing?: Total 6 Click Score: 14    End of Session    OT Visit Diagnosis: Unsteadiness on feet (R26.81);Other abnormalities of gait and mobility (R26.89);Muscle weakness (generalized) (M62.81);Pain   Activity Tolerance Patient tolerated treatment well   Patient Left in bed;with call bell/phone within reach   Nurse Communication           Time: 2202-5427 OT Time Calculation (min): 36 min  Charges: OT General Charges $OT Visit: 1 Visit OT Treatments $Therapeutic Activity: 8-22 mins $Therapeutic Exercise: 8-22 mins  Malachy Chamber, OTR/L Acute Rehab Services Office: [phone]   Layla Maw 12/24/2021, 12:20 PM

## 2021-12-24 NOTE — Progress Notes (Signed)
CSW informed MD of peer to peer request - Dr. Ashok Cordia to call and complete request.  Madilyn Fireman, MSW, LCSW Transitions of Care  Clinical Social Worker II [phone]

## 2021-12-25 DIAGNOSIS — M545 Low back pain, unspecified: Secondary | ICD-10-CM | POA: Diagnosis not present

## 2021-12-25 LAB — PROTIME-INR
INR: 2.7 — ABNORMAL HIGH (ref 0.8–1.2)
Prothrombin Time: 28.3 seconds — ABNORMAL HIGH (ref 11.4–15.2)

## 2021-12-25 NOTE — Progress Notes (Signed)
Physical Therapy Treatment Patient Details Name: Beth Marshall MRN: [id number] DOB: 1965/01/27 Today's Date: 12/25/2021   History of Present Illness Pt is a 56 y.o. female admitted from SNF rehab on 11/12/21 with back pain and x4 month and intermittent L leg pain, weakness, and numbness. Pt has been at [facility] for rehab since June secondary to chronic back pain s/p L tib fib fx (BLE WBAT). PMH includes OA, back pain, hx of DVT, edema, factor V leiden mutation, GERD, anxiety, hypokalemia, insomnia, intrinsic asthma, lipoma, migraine, morbid obesity, tachycardia.    PT Comments    Pt admitted with above diagnosis. Pt was able to tolerate 45 degrees of tilt for 15 min while doing exercises. Then pt did some exercises in sitting as well. Pt has made progress although slow.  Pt met 1/5 goals as pt is limited by pain in bil LEs as well as poor endurance. Pt has made gains in strength however.  Goals revised.   Pt currently with functional limitations due to balance and endurance deficits. Pt will benefit from skilled PT to increase their independence and safety with mobility to allow discharge to the venue listed below.      Recommendations for follow up therapy are one component of a multi-disciplinary discharge planning process, led by the attending physician.  Recommendations may be updated based on patient status, additional functional criteria and insurance authorization.  Follow Up Recommendations  Skilled nursing-short term rehab (<3 hours/day) Can patient physically be transported by private vehicle: No   Assistance Recommended at Discharge Frequent or constant Supervision/Assistance  Patient can return home with the following Two people to help with walking and/or transfers;A lot of help with bathing/dressing/bathroom;Assistance with cooking/housework;Assist for transportation;Help with stairs or ramp for entrance   Equipment Recommendations  Other (comment) (defer to SNF)     Recommendations for Other Services OT consult     Precautions / Restrictions Precautions Precautions: Fall Precaution Comments: bladder/bowel incontinence Restrictions Weight Bearing Restrictions: No Other Position/Activity Restrictions: confirmed WBAT BLE per Dr. Nechama Guard 11/14/21     Mobility  Bed Mobility Overal bed mobility: Needs Assistance Bed Mobility: Rolling, Sidelying to Sit Rolling: Independent Sidelying to sit: Independent Supine to sit: Independent (incr time)     General bed mobility comments: able to use bed controls and bedrails to scoot self up without assist.  Pt uses momentum to come to eOB without assist. Takes incr time. Angle: 45 degrees Total Minutes in Angle: 15 minutes Patient Response: Cooperative  Transfers Overall transfer level: Needs assistance Equipment used:  (Tilt bed) Transfers: Sit to/from Stand Sit to Stand: Total assist           General transfer comment: tilt to 45 degrees for 15 min w/ completion of various UE HEP, stretches and weight shifitng attempts    Ambulation/Gait               General Gait Details: unable   Stairs             Wheelchair Mobility    Modified Rankin (Stroke Patients Only)       Balance           Standing balance support: Bilateral upper extremity supported, During functional activity Standing balance-[name]: Poor Standing balance comment: Tilt bed total of 15 min with max angle 50 degrees                            Cognition Arousal/Alertness:  Awake/alert Behavior During Therapy: WFL for tasks assessed/performed Overall Cognitive Status: Within Functional Limits for tasks assessed                                 General Comments: hyperverbose        Exercises General Exercises - Upper Extremity Shoulder Flexion: Strengthening, Both, 20 reps, Theraband Theraband Level (Shoulder Flexion): Level 4 (Blue) Shoulder ABduction:   (30) Theraband Level (Shoulder Abduction): Level 4 (Blue) Shoulder Horizontal ABduction: Strengthening, Both, Theraband, 20 reps Theraband Level (Shoulder Horizontal Abduction): Level 4 (Blue) Shoulder Horizontal ADduction: AROM, Both, Standing, Other reps (comment) (30 reps) Elbow Flexion: Strengthening, Both, 20 reps, Theraband Theraband Level (Elbow Flexion): Level 4 (Blue) Elbow Extension: Both, Strengthening, 20 reps, Theraband Theraband Level (Elbow Extension): Level 4 (Blue) General Exercises - Lower Extremity Ankle Circles/Pumps: AROM, 10 reps, Supine, Both Quad Sets: AROM, Both, Standing, 10 reps Long Arc Quad: AROM, Both, 20 reps, Seated (with min manual resistance) Heel Slides:  (tilt to 20 degrees) Other Exercises Other Exercises: weight shifting, lifting toe/heel    General Comments        Pertinent Vitals/Pain Pain Assessment Pain Assessment: Faces Faces Pain Scale: Hurts a little bit Pain Location: back Pain Descriptors / Indicators: Grimacing, Guarding Pain Intervention(s): Limited activity within patient's tolerance, Monitored during session, Repositioned    Home Living                          Prior Function            PT Goals (current goals can now be found in the care plan section) Acute Rehab PT Goals Patient Stated Goal: to go to a new SNF for rehab then home PT Goal Formulation: With patient Time For Goal Achievement: 01/08/22 Potential to Achieve Goals: Fair Progress towards PT goals: Progressing toward goals    Frequency    Min 2X/week      PT Plan Current plan remains appropriate    Co-evaluation              AM-PAC PT "6 Clicks" Mobility   Outcome Measure  Help needed turning from your back to your side while in a flat bed without using bedrails?: A Little Help needed moving from lying on your back to sitting on the side of a flat bed without using bedrails?: A Lot Help needed moving to and from a bed to a chair  (including a wheelchair)?: Total Help needed standing up from a chair using your arms (e.g., wheelchair or bedside chair)?: Total Help needed to walk in hospital room?: Total Help needed climbing 3-5 steps with a railing? : Total 6 Click Score: 9    End of Session   Activity Tolerance: Patient tolerated treatment well Patient left: in bed;with call bell/phone within reach Nurse Communication: Mobility status PT Visit Diagnosis: Muscle weakness (generalized) (M62.81);Difficulty in walking, not elsewhere classified (R26.2)     Time: 1540-0867 PT Time Calculation (min) (ACUTE ONLY): 42 min  Charges:  $Therapeutic Exercise: 8-22 mins $Therapeutic Activity: 23-37 mins                     [facility] M,PT Acute Rehab Services [phone]    Alvira Philips 12/25/2021, 3:33 PM

## 2021-12-25 NOTE — Progress Notes (Signed)
Beth Marshall for Warfarin Indication:  paroxysmal A-fib and recurrent DVT history of factor V Leiden mutation and protein S deficiency  Allergies  Allergen Reactions   Cephalexin Other (See Comments)    Unknown reaction per [facility]    Chocolate Other (See Comments)    Unknown reaction per [facility]   Codeine Nausea And Vomiting   Diphenhydramine Hcl Itching   Ranitidine Rash    Patient Measurements: Height: '5\' 6"'$  (167.6 cm) Weight: (!) 185.5 kg (409 lb) IBW/kg (Calculated) : 59.3 Vital Signs: Temp: 97.9 F (36.6 C) (12/08 0530) Temp Source: Oral (12/08 0530) BP: 111/62 (12/08 0530) Pulse Rate: 65 (12/08 0530)  Labs: Recent Labs    12/23/21 1205 12/24/21 0806 12/25/21 0859  HGB  --  12.0  --   HCT  --  39.2  --   PLT  --  252  --   LABPROT 23.6*  --  28.3*  INR 2.1*  --  2.7*     CrCl cannot be calculated (Patient's most recent lab result is older than the maximum 21 days allowed.).  Assessment: 56 yo F with a history of factor V leiden on warfarin, morbid obesity, bedbound status, recent left tib/fib. Patient presented with back pain, bilateral leg pain. Warfarin per pharmacy consult placed for  paroxysmal A-fib and recurrent DVT history of factor V Leiden mutation and protein S deficiency .  PTA dosing: 8.'5mg'$  daily except for '8mg'$  on Tue/Thur  INR 2.7 on '70mg'$ /week compared to PTA weekly dose of 58.'5mg'$ . CBC remains stable today   Goal of Therapy:  INR Goal 2-3 Monitor platelets by anticoagulation protocol: Yes   Plan:  Continue warfarin '10mg'$  daily Will re-check INR on 12/9 as patient has had quick uptrend in INR.  S/Sx of bleeding.   Esmeralda Arthur, PharmD, BCCCP  Clinical Pharmacist Please see AMION for all pharmacy numbers 12/25/2021 9:46 AM

## 2021-12-25 NOTE — ED Notes (Signed)
Pt's linens and chux pads changed at this time. Pt cleaned with wash cloths. Now resting in bed. No needs or concerns voiced at this time.

## 2021-12-25 NOTE — ED Notes (Signed)
Pt's chuck changed, all other linens checked and dry

## 2021-12-26 DIAGNOSIS — M545 Low back pain, unspecified: Secondary | ICD-10-CM | POA: Diagnosis not present

## 2021-12-26 LAB — PROTIME-INR
INR: 2.3 — ABNORMAL HIGH (ref 0.8–1.2)
Prothrombin Time: 25.3 seconds — ABNORMAL HIGH (ref 11.4–15.2)

## 2021-12-26 NOTE — ED Notes (Signed)
Help get patient changed and placed another pad patient is resting with call bell in reach

## 2021-12-26 NOTE — ED Notes (Signed)
Changed patient suction placed another patient is eating up eating lunch with family at bedside and call bell in reach

## 2021-12-26 NOTE — ED Provider Notes (Signed)
Emergency Medicine Observation Re-evaluation Note  Beth Marshall is a 56 y.o. female, seen on rounds today.  Pt initially presented to the ED for complaints of Back Pain Currently, the patient is sleeping.  Physical Exam  BP 109/61   Pulse 72   Temp 98.5 F (36.9 C) (Oral)   Resp 16   Ht '5\' 6"'$  (1.676 m)   Wt (!) 185.5 kg   SpO2 97%   BMI 66.01 kg/m  Physical Exam General: Sleeping Cardiac: Extremities perfused Lungs: Breathing is unlabored Psych: Deferred  ED Course / MDM  EKG:   I have reviewed the labs performed to date as well as medications administered while in observation.  Recent changes in the last 24 hours include seen by physical therapy yesterday.  Patient has made very slow progress.  Mobility remains impaired.  Recommendation is still for skilled nursing/acute rehab facility.  Plan  Current plan is for facility placement.    Godfrey Pick, MD 12/26/21 1106

## 2021-12-26 NOTE — Progress Notes (Signed)
[place] for Warfarin Indication:  paroxysmal A-fib and recurrent DVT history of factor V Leiden mutation and protein S deficiency  Allergies  Allergen Reactions   Cephalexin Other (See Comments)    Unknown reaction per [facility]    Chocolate Other (See Comments)    Unknown reaction per [facility]   Codeine Nausea And Vomiting   Diphenhydramine Hcl Itching   Ranitidine Rash    Patient Measurements: Height: '5\' 6"'$  (167.6 cm) Weight: (!) 185.5 kg (409 lb) IBW/kg (Calculated) : 59.3 Vital Signs: Temp: 98.5 F (36.9 C) (12/09 0340) Temp Source: Oral (12/09 0340) BP: 109/61 (12/09 0340) Pulse Rate: 72 (12/09 0340)  Labs: Recent Labs    12/23/21 1205 12/24/21 0806 12/25/21 0859 12/26/21 0518  HGB  --  12.0  --   --   HCT  --  39.2  --   --   PLT  --  252  --   --   LABPROT 23.6*  --  28.3* 25.3*  INR 2.1*  --  2.7* 2.3*     CrCl cannot be calculated (Patient's most recent lab result is older than the maximum 21 days allowed.).  Assessment: 56 yo F with a history of factor V leiden on warfarin, morbid obesity, bedbound status, recent left tib/fib. Patient presented with back pain, bilateral leg pain. Warfarin per pharmacy consult placed for  paroxysmal A-fib and recurrent DVT history of factor V Leiden mutation and protein S deficiency .  PTA dosing: 8.'5mg'$  daily except for '8mg'$  on Tue/Thur  INR 2.3 on '70mg'$ /week compared to PTA weekly dose of 58.'5mg'$ .   Goal of Therapy:  INR Goal 2-3 Monitor platelets by anticoagulation protocol: Yes   Plan:  Continue warfarin '10mg'$  daily Check INR MWF  S/Sx of bleeding.   Erskine Speed, PharmD Clinical Pharmacist 12/26/2021 10:03 AM

## 2021-12-27 DIAGNOSIS — M545 Low back pain, unspecified: Secondary | ICD-10-CM | POA: Diagnosis not present

## 2021-12-27 NOTE — ED Notes (Signed)
Pt resting in bed and denies any unmet needs at this time. NAD noted.

## 2021-12-27 NOTE — Progress Notes (Signed)
TOC CSW assisted dr with Peer to Peer with Bernadene Bell.  [employer] denied [place].  TOC will staff with TOC management to inquire about LOG with pts medicaid.  Emiliana Blaize Tarpley-Carter, MSW, LCSW-A Pronouns:  She/Her/Hers [facility] HealthTransitions of Care Clinical Social Worker Direct Number:  [phone] [email or web address] [email or web address]

## 2021-12-27 NOTE — ED Provider Notes (Signed)
Emergency Medicine Observation Re-evaluation Note  SHALAUNDA WEATHERHOLTZ is a 56 y.o. female, seen on rounds today.  Pt initially presented to the ED for complaints of Back Pain Currently, the patient is resting in bed.  Physical Exam  BP (!) 112/52   Pulse 78   Temp 97.8 F (36.6 C) (Oral)   Resp 17   Ht '5\' 6"'$  (1.676 m)   Wt (!) 185.5 kg   SpO2 98%   BMI 66.01 kg/m  Physical Exam General: awake, alert, nondistressed Cardiac: normal rate and rhythm Lungs: normal SpO2 ORA Psych: no agitation  ED Course / MDM  EKG:   I have reviewed the labs performed to date as well as medications administered while in observation.  Recent changes in the last 24 hours include none.  Plan  Current plan is for SNF placement    Godfrey Pick, MD 12/27/21 1113

## 2021-12-27 NOTE — ED Notes (Signed)
This RN assumed care of patient. Pt sleeping at this time, NAD noted.

## 2021-12-27 NOTE — ED Notes (Signed)
Pt would like to continue to sleep. NAD noted at this time.

## 2021-12-27 NOTE — ED Notes (Signed)
Pt had bowel movement, was cleaned and changed. Pt denies any unmet needs at this time. NAD noted.

## 2021-12-28 DIAGNOSIS — M545 Low back pain, unspecified: Secondary | ICD-10-CM | POA: Diagnosis not present

## 2021-12-28 NOTE — ED Provider Notes (Signed)
Emergency Medicine Observation Re-evaluation Note  Beth Marshall is a 56 y.o. female, seen on rounds today.  Pt initially presented to the ED for complaints of Back Pain Currently, the patient is resting comfortably.  Physical Exam  BP (!) 112/52   Pulse 78   Temp 97.9 F (36.6 C)   Resp 17   Ht '5\' 6"'$  (1.676 m)   Wt (!) 185.5 kg   SpO2 98%   BMI 66.01 kg/m  Physical Exam General: NAD   ED Course / MDM  EKG:   I have reviewed the labs performed to date as well as medications administered while in observation.  Recent changes in the last 24 hours include no acute events reported.  Plan  Current plan is for SNF placement.    Valarie Merino, MD 12/28/21 [phone]

## 2021-12-28 NOTE — Progress Notes (Signed)
Patient was denied SNF through her insurance company. CSW contacted [facility] to see if they can take patient under a LOG with her Medicaid. CSW left a message with admissions director Irven Shelling. CSW also contacted [facility] leadership to approve a LOG if possible.

## 2021-12-28 NOTE — ED Notes (Signed)
Patient called out reporting that she needed changing. RN observed the purewick was malpositioned. RN changed patient bed pads and replaced purewick in appropriate position. Patient reported that the purewick was functional.

## 2021-12-28 NOTE — Progress Notes (Signed)
[place] for Warfarin Indication:  paroxysmal A-fib and recurrent DVT history of factor V Leiden mutation and protein S deficiency  Allergies  Allergen Reactions   Cephalexin Other (See Comments)    Unknown reaction per [facility]    Chocolate Other (See Comments)    Unknown reaction per [facility]   Codeine Nausea And Vomiting   Diphenhydramine Hcl Itching   Ranitidine Rash    Patient Measurements: Height: '5\' 6"'$  (167.6 cm) Weight: (!) 185.5 kg (409 lb) IBW/kg (Calculated) : 59.3 Vital Signs: Temp: 97.9 F (36.6 C) (12/11 0700)  Labs: Recent Labs    12/26/21 0518  LABPROT 25.3*  INR 2.3*     CrCl cannot be calculated (Patient's most recent lab result is older than the maximum 21 days allowed.).  Assessment: 56 yo F with a history of factor V leiden on warfarin, morbid obesity, bedbound status, recent left tib/fib. Patient presented with back pain, bilateral leg pain. Warfarin per pharmacy consult placed for  paroxysmal A-fib and recurrent DVT history of factor V Leiden mutation and protein S deficiency .  PTA dosing: 8.'5mg'$  daily except for '8mg'$  on Tue/Thur  INR was not obtained with AM labs, RN reported unable to get lab today and phlebotomy would try.  Pt has been stable on this regimen, will continue and attempt lab off schedule tomorrow  Goal of Therapy:  INR Goal 2-3 Monitor platelets by anticoagulation protocol: Yes   Plan:  Continue warfarin '10mg'$  PO daily Will try to obtain INR tomorrow 12/12  Bertis Ruddy, PharmD, [place] Pharmacist ED Pharmacist Phone # [phone] 12/28/2021 1:54 PM

## 2021-12-29 DIAGNOSIS — M545 Low back pain, unspecified: Secondary | ICD-10-CM | POA: Diagnosis not present

## 2021-12-29 LAB — PROTIME-INR
INR: 2.5 — ABNORMAL HIGH (ref 0.8–1.2)
Prothrombin Time: 26.7 seconds — ABNORMAL HIGH (ref 11.4–15.2)

## 2021-12-29 NOTE — ED Provider Notes (Signed)
Emergency Medicine Observation Re-evaluation Note  Beth Marshall is a 56 y.o. female, seen on rounds today.  Pt initially presented to the ED for complaints of Back Pain Currently, the patient is resting comfortably.  Physical Exam  BP 129/67   Pulse 76   Temp 98.1 F (36.7 C) (Oral)   Resp 17   Ht '5\' 6"'$  (1.676 m)   Wt (!) 185.5 kg   SpO2 99%   BMI 66.01 kg/m  Physical Exam General: NAD   ED Course / MDM  EKG:   I have reviewed the labs performed to date as well as medications administered while in observation.  Recent changes in the last 24 hours include no acute events reported.  Plan  Current plan is for placement. As per SW note:  "Patient was denied SNF through her insurance company. CSW contacted [facility] to see if they can take patient under a LOG with her Medicaid. CSW left a message with admissions director Irven Shelling. CSW also contacted [facility] leadership to approve a LOG if possible. "   Valarie Merino, MD 12/29/21 0830

## 2021-12-29 NOTE — ED Notes (Signed)
Patient was incontinent to stool and urine.Patient was changed and linens were removed and replaced. Patient is now resting in bed.

## 2021-12-29 NOTE — Progress Notes (Signed)
CSW has not been able to get in touch with admissions director Irven Shelling. CSW left another message requesting a call back.

## 2021-12-29 NOTE — Progress Notes (Signed)
Physical Therapy Treatment Patient Details Name: Beth Marshall MRN: [id number] DOB: 01-24-1965 Today's Date: 12/29/2021   History of Present Illness Pt is Beth 56 y.o. female admitted from SNF rehab on 11/12/21 with back pain and x4 month and intermittent L leg pain, weakness, and numbness. Pt has been at [facility] for rehab since June secondary to chronic back pain s/p L tib fib fx (BLE WBAT). PMH includes OA, back pain, hx of DVT, edema, factor V leiden mutation, GERD, anxiety, hypokalemia, insomnia, intrinsic asthma, lipoma, migraine, morbid obesity, tachycardia.    PT Comments    Pt admitted with above diagnosis. Pt was able to stand x 3 attempts today for 45 seconds each attemptto Beth Marshall walker. Pt needing mod assist of 2 to power up but was min guard in standing once up.  Pt progressing each day and is getting stronger.  Will continue PT.  Pt currently with functional limitations due to balance and endurance deficits. Pt will benefit from skilled PT to increase their independence and safety with mobility to allow discharge to the venue listed below.      Recommendations for follow up therapy are one component of Beth multi-disciplinary discharge planning process, led by the attending physician.  Recommendations may be updated based on patient status, additional functional criteria and insurance authorization.  Follow Up Recommendations  Skilled nursing-short term rehab (<3 hours/day) Can patient physically be transported by private vehicle: No   Assistance Recommended at Discharge Frequent or constant Supervision/Assistance  Patient can return home with the following Two people to help with walking and/or transfers;Beth lot of help with bathing/dressing/bathroom;Assistance with cooking/housework;Assist for transportation;Help with stairs or ramp for entrance   Equipment Recommendations  Other (comment) (defer to SNF)    Recommendations for Other Services OT consult     Precautions /  Restrictions Precautions Precautions: Fall Precaution Comments: bladder/bowel incontinence Restrictions Weight Bearing Restrictions: No Other Position/Activity Restrictions: confirmed WBAT BLE per Dr. Nechama Guard 11/14/21     Mobility  Bed Mobility Overal bed mobility: Needs Assistance Bed Mobility: Rolling, Sidelying to Sit Rolling: Independent Sidelying to sit: Independent Supine to sit: Independent (incr time) Sit to supine: Min assist   General bed mobility comments: able to use bed controls and bedrails to scoot self up without assist.  Pt uses momentum to come to eOB without assist. Takes incr time.    Transfers Overall transfer level: Needs assistance Equipment used:  ([name]) Transfers: Sit to/from Stand Sit to Stand: Mod assist, Min assist, +2 physical assistance, From elevated surface           General transfer comment: Pt held onto [name] and was able to stand to [name] x 3 for about 45 seconds each attempt. Pt with flexed posture intentionally however pt did clear entire buttocks off bed for all 3 stands.  Needed mod assist of 2 for the power up but once up, pt was min guard assist.    Ambulation/Gait               General Gait Details: unable   Stairs             Wheelchair Mobility    Modified Rankin (Stroke Patients Only)       Balance Overall balance assessment: Needs assistance Sitting-balance support: Feet supported, No upper extremity supported Sitting balance-[name]: Fair Sitting balance - Comments: Able to sit EOB without UE support, did some LE exercise   Standing balance support: Bilateral upper extremity supported, During  functional activity Standing balance-[name]: Poor Standing balance comment: stood to [name] for 45 seconds with min gaurd assist of 2                            Cognition Arousal/Alertness: Awake/alert Behavior During Therapy: WFL for tasks assessed/performed Overall  Cognitive Status: Within Functional Limits for tasks assessed                                 General Comments: hyperverbose        Exercises      General Comments General comments (skin integrity, edema, etc.): Reported dizzziness after 2nd stand however BP stable.      Pertinent Vitals/Pain Pain Assessment Pain Assessment: Faces Faces Pain Scale: Hurts Beth little bit Pain Location: back Pain Descriptors / Indicators: Grimacing, Guarding Pain Intervention(s): Limited activity within patient's tolerance, Monitored during session, Repositioned, Premedicated before session    Home Living                          Prior Function            PT Goals (current goals can now be found in the care plan section) Acute Rehab PT Goals Patient Stated Goal: to go to Beth new SNF for rehab then home Progress towards PT goals: Progressing toward goals    Frequency    Min 2X/week      PT Plan Current plan remains appropriate    Co-evaluation              AM-PAC PT "6 Clicks" Mobility   Outcome Measure  Help needed turning from your back to your side while in Beth flat bed without using bedrails?: Beth Little Help needed moving from lying on your back to sitting on the side of Beth flat bed without using bedrails?: Beth Lot Help needed moving to and from Beth bed to Beth chair (including Beth wheelchair)?: Total Help needed standing up from Beth chair using your arms (e.g., wheelchair or bedside chair)?: Total Help needed to walk in hospital room?: Total Help needed climbing 3-5 steps with Beth railing? : Total 6 Click Score: 9    End of Session Equipment Utilized During Treatment: Gait belt Activity Tolerance: Patient tolerated treatment well Patient left: in bed;with call bell/phone within reach;with family/visitor present Nurse Communication: Mobility status PT Visit Diagnosis: Muscle weakness (generalized) (M62.81);Difficulty in walking, not elsewhere classified (R26.2)      Time: 9983-3825 PT Time Calculation (min) (ACUTE ONLY): 26 min  Charges:  $Therapeutic Activity: 23-37 mins                     Aerabella Marshall M,PT Acute Rehab Services [phone]    Beth Marshall 12/29/2021, 12:01 PM

## 2021-12-29 NOTE — Progress Notes (Signed)
CSW spoke with Irven Shelling stated she will let CSW know about the LOG and call her back tomorrow. CSW was told that they would have to buy special equipment for patient due to her weight.

## 2021-12-29 NOTE — Progress Notes (Signed)
[place] for Warfarin Indication:  paroxysmal A-fib and recurrent DVT history of factor V Leiden mutation and protein S deficiency  Allergies  Allergen Reactions   Cephalexin Other (See Comments)    Unknown reaction per [facility]    Chocolate Other (See Comments)    Unknown reaction per [facility]   Codeine Nausea And Vomiting   Diphenhydramine Hcl Itching   Ranitidine Rash    Patient Measurements: Height: '5\' 6"'$  (167.6 cm) Weight: (!) 185.5 kg (409 lb) IBW/kg (Calculated) : 59.3 Vital Signs: Temp: 98.1 F (36.7 C) (12/12 0343) Temp Source: Oral (12/12 0343) BP: 129/67 (12/12 0344) Pulse Rate: 76 (12/12 0344)  Labs: Recent Labs    12/29/21 0343  LABPROT 26.7*  INR 2.5*     CrCl cannot be calculated (Patient's most recent lab result is older than the maximum 21 days allowed.).  Assessment: 56 yo F with a history of factor V leiden on warfarin, morbid obesity, bedbound status, recent left tib/fib. Patient presented with back pain, bilateral leg pain. Warfarin per pharmacy consult placed for  paroxysmal A-fib and recurrent DVT history of factor V Leiden mutation and protein S deficiency .  PTA dosing: 8.'5mg'$  daily except for '8mg'$  on Tue/Thur  INR remains therapeutic and stable  Goal of Therapy:  INR Goal 2-3 Monitor platelets by anticoagulation protocol: Yes   Plan:  Continue warfarin '10mg'$  PO daily INR MWF, monitor s/s bleeding  Bertis Ruddy, PharmD, [place] Pharmacist ED Pharmacist Phone # [phone] 12/29/2021 7:36 AM

## 2021-12-30 DIAGNOSIS — M545 Low back pain, unspecified: Secondary | ICD-10-CM | POA: Diagnosis not present

## 2021-12-30 LAB — PROTIME-INR
INR: 2.3 — ABNORMAL HIGH (ref 0.8–1.2)
Prothrombin Time: 24.8 seconds — ABNORMAL HIGH (ref 11.4–15.2)

## 2021-12-30 NOTE — Progress Notes (Signed)
[place] for Warfarin Indication:  paroxysmal A-fib and recurrent DVT history of factor V Leiden mutation and protein S deficiency  Allergies  Allergen Reactions   Cephalexin Other (See Comments)    Unknown reaction per [facility]    Chocolate Other (See Comments)    Unknown reaction per [facility]   Codeine Nausea And Vomiting   Diphenhydramine Hcl Itching   Ranitidine Rash    Patient Measurements: Height: '5\' 6"'$  (167.6 cm) Weight: (!) 185.5 kg (409 lb) IBW/kg (Calculated) : 59.3 Vital Signs: Temp: 98.1 F (36.7 C) (12/13 0459) Temp Source: Oral (12/13 0459) BP: 121/61 (12/13 0459) Pulse Rate: 62 (12/13 0459)  Labs: Recent Labs    12/29/21 0343 12/30/21 0500  LABPROT 26.7* 24.8*  INR 2.5* 2.3*     CrCl cannot be calculated (Patient's most recent lab result is older than the maximum 21 days allowed.).  Assessment: 56 yo F with a history of factor V leiden on warfarin, morbid obesity, bedbound status, recent left tib/fib. Patient presented with back pain, bilateral leg pain. Warfarin per pharmacy consult placed for  paroxysmal A-fib and recurrent DVT history of factor V Leiden mutation and protein S deficiency .  PTA dosing: 8.'5mg'$  daily except for '8mg'$  on Tue/Thur  INR remains therapeutic, stable on current regimen  Goal of Therapy:  INR Goal 2-3 Monitor platelets by anticoagulation protocol: Yes   Plan:  Continue warfarin '10mg'$  PO daily INR MWF, monitor s/s bleeding  Bertis Ruddy, PharmD, [place] Pharmacist ED Pharmacist Phone # [phone] 12/30/2021 7:39 AM

## 2021-12-30 NOTE — Progress Notes (Signed)
TOC CSW attempted to contact [place] @ [facility] [phone].  CSW left HIPPA compliant message with my contact information.   Alia Parsley Tarpley-Carter, MSW, LCSW-A Pronouns:  She/Her/Hers [facility] Clinical Social Worker Direct Number:  [phone] [email or web address]

## 2021-12-31 DIAGNOSIS — M545 Low back pain, unspecified: Secondary | ICD-10-CM | POA: Diagnosis not present

## 2021-12-31 MED ORDER — HYDROXYZINE HCL 25 MG PO TABS
25.0000 mg | ORAL_TABLET | Freq: Once | ORAL | Status: AC
  Administered 2021-12-31: 25 mg via ORAL
  Filled 2021-12-31: qty 1

## 2021-12-31 MED ORDER — FUROSEMIDE 20 MG PO TABS
80.0000 mg | ORAL_TABLET | Freq: Once | ORAL | Status: AC
  Administered 2021-12-31: 80 mg via ORAL
  Filled 2021-12-31: qty 4

## 2021-12-31 MED ORDER — HYDROXYZINE HCL 25 MG PO TABS
50.0000 mg | ORAL_TABLET | Freq: Once | ORAL | Status: AC
  Administered 2021-12-31: 50 mg via ORAL
  Filled 2021-12-31: qty 2

## 2021-12-31 NOTE — ED Notes (Signed)
Pt incontinent of urine; pt cleaned, chux changed

## 2021-12-31 NOTE — ED Notes (Signed)
PT at bedside.

## 2021-12-31 NOTE — ED Notes (Signed)
RN changed pt chucks d/t wetness from urine.

## 2021-12-31 NOTE — ED Notes (Signed)
Pt cleaned, linens and chux changed

## 2021-12-31 NOTE — Progress Notes (Signed)
Physical Therapy Treatment Patient Details Name: Beth Marshall MRN: [id number] DOB: [birth year] Today's Date: 12/31/2021   History of Present Illness Pt is a 56 y.o. female admitted from SNF rehab on 11/12/21 with back pain and x4 month and intermittent L leg pain, weakness, and numbness. Pt has been at [facility] for rehab since June secondary to chronic back pain s/p L tib fib fx (BLE WBAT). PMH includes OA, back pain, hx of DVT, edema, factor V leiden mutation, GERD, anxiety, hypokalemia, insomnia, intrinsic asthma, lipoma, migraine, morbid obesity, tachycardia.    PT Comments    Pt admitted with above diagnosis. Pt continues to make progress toward standing goals. Will continue to follow.  Pt currently with functional limitations due to balance and endruance deficits. Pt will benefit from skilled PT to increase their independence and safety with mobility to allow discharge to the venue listed below.      Recommendations for follow up therapy are one component of a multi-disciplinary discharge planning process, led by the attending physician.  Recommendations may be updated based on patient status, additional functional criteria and insurance authorization.  Follow Up Recommendations  Skilled nursing-short term rehab (<3 hours/day) Can patient physically be transported by private vehicle: No   Assistance Recommended at Discharge Frequent or constant Supervision/Assistance  Patient can return home with the following Two people to help with walking and/or transfers;A lot of help with bathing/dressing/bathroom;Assistance with cooking/housework;Assist for transportation;Help with stairs or ramp for entrance   Equipment Recommendations  Other (comment) (defer to SNF)    Recommendations for Other Services OT consult     Precautions / Restrictions Precautions Precautions: Fall Precaution Comments: bladder/bowel incontinence Restrictions Other Position/Activity Restrictions:  confirmed WBAT BLE per Dr. Nechama Guard 11/14/21     Mobility  Bed Mobility Overal bed mobility: Needs Assistance Bed Mobility: Rolling, Sidelying to Sit Rolling: Independent Sidelying to sit: Independent Supine to sit: Independent (incr time) Sit to supine: Min assist   General bed mobility comments: able to use bed controls and bedrails to scoot self up without assist.  Pt uses momentum to come to eOB without assist. Takes incr time.    Transfers Overall transfer level: Needs assistance Equipment used:  ([name]) Transfers: Sit to/from Stand Sit to Stand: Mod assist, Min assist, +2 physical assistance, From elevated surface          Lateral/Scoot Transfers: Min guard, Supervision General transfer comment: Pt held onto [facility] walker and was able to stand to [name] x 3 for about 18 seconds, 10 seconds and then 10 seconds. Pt states she could have done better if premedicated 2 hours before. Pt with flexed posture intentionally however pt did clear entire buttocks off bed for all 3 stands.  Needed mod assist of 2 for the power up but once up, pt was min guard assist.    Ambulation/Gait               General Gait Details: unable   Stairs             Wheelchair Mobility    Modified Rankin (Stroke Patients Only)       Balance Overall balance assessment: Needs assistance Sitting-balance support: Feet supported, No upper extremity supported Sitting balance-[name]: Fair Sitting balance - Comments: Able to sit EOB without UE support, did some LE exercise   Standing balance support: Bilateral upper extremity supported, During functional activity Standing balance-[name]: Poor Standing balance comment: stood to [name] for 45 seconds with min  gaurd assist of 2                            Cognition Arousal/Alertness: Awake/alert Behavior During Therapy: WFL for tasks assessed/performed Overall Cognitive Status: Within Functional Limits for  tasks assessed                                 General Comments: hyperverbose        Exercises General Exercises - Lower Extremity Ankle Circles/Pumps: AROM, 10 reps, Supine, Both Quad Sets: AROM, Both, Standing, 10 reps Straight Leg Raises: AROM, Both, 10 reps, Supine Hip Flexion/Marching: AROM, 10 reps, Both, Seated    General Comments        Pertinent Vitals/Pain Pain Assessment Pain Assessment: Faces Faces Pain Scale: Hurts even more Pain Location: knee Pain Descriptors / Indicators: Grimacing, Guarding Pain Intervention(s): Limited activity within patient's tolerance, Monitored during session, Repositioned, RN gave pain meds during session    Home Living                          Prior Function            PT Goals (current goals can now be found in the care plan section) Acute Rehab PT Goals Patient Stated Goal: to go to a new SNF for rehab then home Progress towards PT goals: Progressing toward goals    Frequency    Min 2X/week      PT Plan Current plan remains appropriate    Co-evaluation              AM-PAC PT "6 Clicks" Mobility   Outcome Measure  Help needed turning from your back to your side while in a flat bed without using bedrails?: A Little Help needed moving from lying on your back to sitting on the side of a flat bed without using bedrails?: A Lot Help needed moving to and from a bed to a chair (including a wheelchair)?: Total Help needed standing up from a chair using your arms (e.g., wheelchair or bedside chair)?: Total Help needed to walk in hospital room?: Total Help needed climbing 3-5 steps with a railing? : Total 6 Click Score: 9    End of Session Equipment Utilized During Treatment: Gait belt Activity Tolerance: Patient tolerated treatment well Patient left: in bed;with call bell/phone within reach;with family/visitor present Nurse Communication: Mobility status PT Visit Diagnosis: Muscle weakness  (generalized) (M62.81);Difficulty in walking, not elsewhere classified (R26.2)     Time: 6629-4765 PT Time Calculation (min) (ACUTE ONLY): 35 min  Charges:  $Therapeutic Exercise: 8-22 mins $Therapeutic Activity: 8-22 mins                     [facility] M,PT Acute Rehab Services [phone]    Alvira Philips 12/31/2021, 1:58 PM

## 2021-12-31 NOTE — ED Notes (Signed)
Pt incontinent of urine, pt cleaned and changed

## 2021-12-31 NOTE — ED Notes (Signed)
Pt requesting medication to help with anxiety. Pt states her parents aren't in good shape and she is scared she will receive bad news. Pt was given melatonin to help with resting. RN will notify provider if anything additional can be given.

## 2021-12-31 NOTE — Progress Notes (Signed)
TOC CSW attempted to [facility] with no [facility].  CSW left HIPPA compliant message with my contact information.   Burnett Lieber Tarpley-Carter, MSW, LCSW-A Pronouns:  She/Her/Hers [facility] Clinical Social Worker Direct Number:  [phone] [email or web address]

## 2021-12-31 NOTE — Progress Notes (Signed)
CSW contacted Irven Shelling with [facility] again with no answer.

## 2021-12-31 NOTE — ED Notes (Signed)
Pt stated to place monitor equipment on standby d/t beeping bothers her.

## 2022-01-01 DIAGNOSIS — M545 Low back pain, unspecified: Secondary | ICD-10-CM | POA: Diagnosis not present

## 2022-01-01 LAB — PROTIME-INR
INR: 2.5 — ABNORMAL HIGH (ref 0.8–1.2)
Prothrombin Time: 26.9 seconds — ABNORMAL HIGH (ref 11.4–15.2)

## 2022-01-01 MED ORDER — NYSTATIN 100000 UNIT/GM EX POWD
Freq: Once | CUTANEOUS | Status: AC
  Filled 2022-01-01: qty 15

## 2022-01-01 MED ORDER — ALBUTEROL SULFATE HFA 108 (90 BASE) MCG/ACT IN AERS
2.0000 | INHALATION_SPRAY | RESPIRATORY_TRACT | Status: DC | PRN
  Administered 2022-01-01 – 2022-01-13 (×4): 2 via RESPIRATORY_TRACT
  Filled 2022-01-01: qty 6.7

## 2022-01-01 NOTE — ED Notes (Signed)
Patient has a purewick however it doesn't always work correctly called to room patient was wet however purewick was in place , patient changed and cleaned small dime side blister on right lateral hip barrier cream applied. Patient repositioned in bed.

## 2022-01-01 NOTE — Progress Notes (Signed)
Occupational Therapy Treatment Patient Details Name: Beth Marshall MRN: [id number] DOB: 11-May-1965 Today's Date: 01/01/2022   History of present illness Pt is a 56 y.o. female admitted from SNF rehab on 11/12/21 with back pain and x4 month and intermittent L leg pain, weakness, and numbness. Pt has been at [facility] for rehab since June secondary to chronic back pain s/p L tib fib fx (BLE WBAT). PMH includes OA, back pain, hx of DVT, edema, factor V leiden mutation, GERD, anxiety, hypokalemia, insomnia, intrinsic asthma, lipoma, migraine, morbid obesity, tachycardia.   OT comments  Focus on tilting up to 50* for weightbearing and weightshifting in prep for standing transfers. Pt limited by L knee muscle cramps/spasms during session though still eager to attempt all tasks. Pt's sister present and hands on to assist pt during session. Pt hopeful to attempt taking a few steps next week when +2 assist available. Continue to rec SNF rehab at DC.   Recommendations for follow up therapy are one component of a multi-disciplinary discharge planning process, led by the attending physician.  Recommendations may be updated based on patient status, additional functional criteria and insurance authorization.    Follow Up Recommendations  Skilled nursing-short term rehab (<3 hours/day)     Assistance Recommended at Discharge Frequent or constant Supervision/Assistance  Patient can return home with the following  Two people to help with walking and/or transfers;Two people to help with bathing/dressing/bathroom   Equipment Recommendations  Hospital bed;Wheelchair cushion (measurements OT);Wheelchair (measurements OT);Other (comment) (hoyer lift)    Recommendations for Other Services      Precautions / Restrictions Precautions Precautions: Fall Precaution Comments: bladder/bowel incontinence Restrictions Weight Bearing Restrictions: Yes Other Position/Activity Restrictions: confirmed WBAT BLE  per Dr. Nechama Guard 11/14/21       Mobility Bed Mobility               General bed mobility comments: Light Min A to scoot up in bed    Transfers                         Balance                                           ADL either performed or assessed with clinical judgement   ADL Overall ADL's : Needs assistance/impaired                     Lower Body Dressing: Total assistance;Bed level Lower Body Dressing Details (indicate cue type and reason): sister present, assisting with socks               General ADL Comments: Focus on tilting , weightbearing and weightshifting, discussion of pt goals and areas to address. Tilt to 50* briefly prior to L LE cramps w/ pt need to return to supine x 2 during session. able to tilt around 45* for approx 6 min today    Extremity/Trunk Assessment Upper Extremity Assessment Upper Extremity Assessment: Overall WFL for tasks assessed   Lower Extremity Assessment Lower Extremity Assessment: Defer to PT evaluation        Vision   Vision Assessment?: No apparent visual deficits   Perception     Praxis      Cognition Arousal/Alertness: Awake/alert Behavior During Therapy: WFL for tasks assessed/performed Overall Cognitive Status: Within Functional Limits for tasks assessed  General Comments: hyperverbose        Exercises      Shoulder Instructions       General Comments Sister present, hands on to assist and adjust tilt bed    Pertinent Vitals/ Pain       Pain Assessment Pain Assessment: Faces Faces Pain Scale: Hurts whole lot Pain Location: L knee Pain Descriptors / Indicators: Cramping Pain Intervention(s): Monitored during session, Limited activity within patient's tolerance, Premedicated before session  Home Living                                          Prior Functioning/Environment               Frequency  Min 1X/week        Progress Toward Goals  OT Goals(current goals can now be found in the care plan section)  Progress towards OT goals: Progressing toward goals  Acute Rehab OT Goals Patient Stated Goal: take a few steps next week OT Goal Formulation: With patient Time For Goal Achievement: 12/28/21 Potential to Achieve Goals: Fair ADL Goals Pt Will Transfer to Toilet: with mod assist;with +2 assist;stand pivot transfer;squat pivot transfer;bedside commode Additional ADL Goal #1: pt to complete bed mobility with Modified Independence as precursor to ADLs Additional ADL Goal #2: Pt to stand from EOB with Mod A x 2 to assist with LB ADLs Additional ADL Goal #3: Pt to tolerate tilting > 50* for 5 min during functional tasks  Plan Discharge plan remains appropriate    Co-evaluation                 AM-PAC OT "6 Clicks" Daily Activity     Outcome Measure   Help from another person eating meals?: None Help from another person taking care of personal grooming?: A Little Help from another person toileting, which includes using toliet, bedpan, or urinal?: Total Help from another person bathing (including washing, rinsing, drying)?: A Lot Help from another person to put on and taking off regular upper body clothing?: A Little Help from another person to put on and taking off regular lower body clothing?: Total 6 Click Score: 14    End of Session    OT Visit Diagnosis: Unsteadiness on feet (R26.81);Other abnormalities of gait and mobility (R26.89);Muscle weakness (generalized) (M62.81);Pain Pain - Right/Left: Left Pain - part of body: Knee   Activity Tolerance Patient tolerated treatment well   Patient Left in bed;with call bell/phone within reach;with family/visitor present   Nurse Communication          Time: 1610-9604 OT Time Calculation (min): 36 min  Charges: OT General Charges $OT Visit: 1 Visit OT Treatments $Therapeutic Activity: 23-37  mins  Malachy Chamber, OTR/L Acute Rehab Services Office: [phone]   Layla Maw 01/01/2022, 1:16 PM

## 2022-01-01 NOTE — Progress Notes (Signed)
CSW has not been able to find a SNF to accept patient due to her current weight. Patients insurance company also denied her for SNF placement. CSW discussed patient discharging home with family. CSW was told by the family that they cannot accommodate patient at home.

## 2022-01-01 NOTE — Progress Notes (Signed)
[place] for Warfarin Indication:  paroxysmal A-fib and recurrent DVT history of factor V Leiden mutation and protein S deficiency  Allergies  Allergen Reactions   Cephalexin Other (See Comments)    Unknown reaction per [facility]    Chocolate Other (See Comments)    Unknown reaction per [facility]   Codeine Nausea And Vomiting   Diphenhydramine Hcl Itching   Ranitidine Rash    Patient Measurements: Height: '5\' 6"'$  (167.6 cm) Weight: (!) 202 kg (445 lb 5.3 oz) IBW/kg (Calculated) : 59.3 Vital Signs: Temp: 98 F (36.7 C) (12/14 2205) Temp Source: Oral (12/14 2205) BP: 123/58 (12/14 2205) Pulse Rate: 80 (12/14 2205)  Labs: Recent Labs    12/30/21 0500 01/01/22 0832  LABPROT 24.8* 26.9*  INR 2.3* 2.5*     CrCl cannot be calculated (Patient's most recent lab result is older than the maximum 21 days allowed.).  Assessment: 56 yo F with a history of factor V leiden on warfarin, morbid obesity, bedbound status, recent left tib/fib. Patient presented with back pain, bilateral leg pain. Warfarin per pharmacy consult placed for  paroxysmal A-fib and recurrent DVT history of factor V Leiden mutation and protein S deficiency .  PTA dosing: 8.'5mg'$  daily except for '8mg'$  on Tue/Thur  INR remains therapeutic this AM  Goal of Therapy:  INR Goal 2-3 Monitor platelets by anticoagulation protocol: Yes   Plan:  Continue warfarin '10mg'$  PO daily INR MWF, monitor s/s bleeding  Bertis Ruddy, PharmD, [place] Pharmacist ED Pharmacist Phone # [phone] 01/01/2022 9:31 AM

## 2022-01-02 DIAGNOSIS — M545 Low back pain, unspecified: Secondary | ICD-10-CM | POA: Diagnosis not present

## 2022-01-02 NOTE — ED Notes (Signed)
Patient had a bowel movement. Patient had the chux changed and peri care performed by this RN. Patient was able to assist well by rolling to her side and lifting her legs up.

## 2022-01-02 NOTE — ED Provider Notes (Signed)
Patient boarding pending SNF placement for chronic back pain - difficulties finding placement due to insurance problems.  TOC in consult, appreciate their help on this.   Wyvonnia Dusky, MD 01/02/22 1116

## 2022-01-03 DIAGNOSIS — M545 Low back pain, unspecified: Secondary | ICD-10-CM | POA: Diagnosis not present

## 2022-01-03 NOTE — ED Provider Notes (Signed)
Patient remains in the ED, stable overnight per review of vital signs, no acute events reported by nursing.  We are still searching for placement with limited options given the patient's insurance and financial issues.   Wyvonnia Dusky, MD 01/03/22 1126

## 2022-01-03 NOTE — ED Notes (Signed)
Patient requesting to speak to hospitalist about diet order/consult.  Patient reports she has not seen the dietician and would like to speak to the hospitalist about that tomorrow when her sister is here.

## 2022-01-03 NOTE — ED Notes (Signed)
Patient's chuk between legs changed d/t urinary incontinence and purewick malpositioned.

## 2022-01-04 DIAGNOSIS — M545 Low back pain, unspecified: Secondary | ICD-10-CM | POA: Diagnosis not present

## 2022-01-04 LAB — PROTIME-INR
INR: 2.3 — ABNORMAL HIGH (ref 0.8–1.2)
Prothrombin Time: 25.1 seconds — ABNORMAL HIGH (ref 11.4–15.2)

## 2022-01-04 MED ORDER — LORAZEPAM 1 MG PO TABS
1.0000 mg | ORAL_TABLET | Freq: Two times a day (BID) | ORAL | Status: AC | PRN
  Administered 2022-01-09 – 2022-01-11 (×3): 1 mg via ORAL
  Filled 2022-01-04 (×3): qty 1

## 2022-01-04 NOTE — ED Notes (Signed)
No therapy today rehab said no

## 2022-01-04 NOTE — Progress Notes (Signed)
Initial Nutrition Assessment  DOCUMENTATION CODES:   Morbid obesity  INTERVENTION:   -Changed diet to CHO modified/heart healthy diet for better portion control of carbohydrate foods.  -Recommend patient work with Physical therapy to ensure pt is getting some activity.  -Daily weights if weight is a factor in discharge. Last weight was recorded as 445 lbs on 12/14.  -Placed "General Healthy Eating" handout in AVS  NUTRITION DIAGNOSIS:   Food and nutrition related knowledge deficit related to limited prior education as evidenced by  (pt's request for information).  GOAL:   Other (Comment)  Meet criteria for discharge.  MONITOR:   PO intake, Weight trends  REASON FOR ASSESSMENT:   Consult Assessment of nutrition requirement/status  ASSESSMENT:   56 y.o. female, seen on rounds today.  Pt initially presented to the ED for complaints of Back Pain  TOC paged on-call pager for RD regarding pt's barrier to discharge being her weight status. MD also placed consult this morning. Pt cannot discharge to SNF if weighing ~500 lbs. Last recorded weight is 445 lbs on 12/14. Have ordered for daily weights to have more recent weight recording.  Per TOC, pt has been ordering double portions and high amounts of carbohydrate foods.  Will switch diet to CHO modified to better portion out carbohydrate foods. Was already on heart healthy, will continue sodium restriction.  Encourage protein with meals.  Recommend working with physical therapy closely to ensure pt is getting some activity.  Admission weight: 481 lbs Current weight: 445 lbs (12/14) Needs updated weight, have ordered for daily weights.   Medications: Lasix, KLOR-CON, Vitamin D weekly,  Labs reviewed.  NUTRITION - FOCUSED PHYSICAL EXAM:  Unable to complete, working remotely.   Diet Order:   Diet Order             Diet heart healthy/carb modified Room service appropriate? Yes; Fluid consistency: Thin  Diet effective  now                   EDUCATION NEEDS:   Education needs have been addressed  Skin:  Skin Assessment: Reviewed RN Assessment  Last BM:  12/15 -type 6  Height:   Ht Readings from Last 1 Encounters:  11/30/21 '5\' 6"'$  (1.676 m)    Weight:   Wt Readings from Last 1 Encounters:  12/31/21 (!) 202 kg    BMI:  Body mass index is 71.88 kg/m.  Estimated Nutritional Needs:   Kcal:  1500-1700  Protein:  85-100g  Fluid:  1.5L/day  Clayton Bibles, MS, RD, LDN Inpatient Clinical Dietitian Contact information available via Amion

## 2022-01-04 NOTE — Progress Notes (Signed)
Patient is not eligible for SNF placement at this time due to patients weight and her insurance company not willing to pay for SNF. TOC leadership is aware.

## 2022-01-04 NOTE — ED Provider Notes (Addendum)
Patient seen by me.  Patient awaiting placement.  Patient waiting placement for chronic back pain.  Does not meet admission criteria.  Patient remains in the ED, stable overnight per review of vital signs, no acute events reported by nursing.  We are still searching for placement with limited options given the patient's insurance and financial issues     Fredia Sorrow, MD 01/04/22 2227   Patient questioned about the dietitian consult.  It appears that that was done the other day.  And they did modify her diet but they most likely did not see her in person.  Patient also requesting something for anxiety will review chart and see if we can put something in.  Patient certainly nontoxic no acute distress.  Tolerating this prolonged stay fairly well.   Fredia Sorrow, MD 01/04/22 2248

## 2022-01-04 NOTE — Progress Notes (Signed)
[place] for Warfarin Indication:  paroxysmal A-fib and recurrent DVT history of factor V Leiden mutation and protein S deficiency  Allergies  Allergen Reactions   Cephalexin Other (See Comments)    Unknown reaction per [facility]    Chocolate Other (See Comments)    Unknown reaction per [facility]   Codeine Nausea And Vomiting   Diphenhydramine Hcl Itching   Ranitidine Rash    Patient Measurements: Height: '5\' 6"'$  (167.6 cm) Weight: (!) 202 kg (445 lb 5.3 oz) IBW/kg (Calculated) : 59.3 Vital Signs:    Labs: No results for input(s): "HGB", "HCT", "PLT", "APTT", "LABPROT", "INR", "HEPARINUNFRC", "HEPRLOWMOCWT", "CREATININE", "CKTOTAL", "CKMB", "TROPONINIHS" in the last 72 hours.   CrCl cannot be calculated (Patient's most recent lab result is older than the maximum 21 days allowed.).  Assessment: 56 yo F with a history of factor V leiden on warfarin, morbid obesity, bedbound status, recent left tib/fib. Patient presented with back pain, bilateral leg pain. Warfarin per pharmacy consult placed for  paroxysmal A-fib and recurrent DVT history of factor V Leiden mutation and protein S deficiency .  PTA dosing: 8.'5mg'$  daily except for '8mg'$  on Tue/Thur  INR not drawn this AM, pt can be a difficult stick, has been stable on this regimen  Goal of Therapy:  INR Goal 2-3 Monitor platelets by anticoagulation protocol: Yes   Plan:  Continue warfarin '10mg'$  PO daily INR off schedule tomorrow + MWF, monitor s/s bleeding  Bertis Ruddy, PharmD, [place] Pharmacist ED Pharmacist Phone # [phone] 01/04/2022 1:56 PM

## 2022-01-04 NOTE — ED Notes (Addendum)
Patient's chuk between legs replaced due to being wet. Peri care provided.

## 2022-01-05 DIAGNOSIS — M545 Low back pain, unspecified: Secondary | ICD-10-CM | POA: Diagnosis not present

## 2022-01-05 LAB — PROTIME-INR
INR: 2.3 — ABNORMAL HIGH (ref 0.8–1.2)
Prothrombin Time: 25.4 seconds — ABNORMAL HIGH (ref 11.4–15.2)

## 2022-01-05 NOTE — ED Notes (Signed)
Pt bed pad changed. Peri care.

## 2022-01-05 NOTE — Progress Notes (Signed)
Patient is now on a carb modified diet. The goal is to get patients weight down so she can transition to SNF. Patient went from 445 lbs to 427 lbs.

## 2022-01-05 NOTE — ED Notes (Signed)
Pt bed pad changed. Urine cannister emptied.

## 2022-01-05 NOTE — Progress Notes (Signed)
Physical Therapy Treatment Patient Details Name: Beth Marshall MRN: [id number] DOB: September 11, 1965 Today's Date: 01/05/2022   History of Present Illness Pt is a 56 y.o. female admitted from SNF rehab on 11/12/21 with back pain and x4 month and intermittent L leg pain, weakness, and numbness. Pt has been at [facility] for rehab since June secondary to chronic back pain s/p L tib fib fx (BLE WBAT). PMH includes OA, back pain, hx of DVT, edema, factor V leiden mutation, GERD, anxiety, hypokalemia, insomnia, intrinsic asthma, lipoma, migraine, morbid obesity, tachycardia.    PT Comments    Pt admitted with above diagnosis. Pt continues to make progress toward goals although slow.  Will continue PT.  Pt currently with functional limitations due to balance and endurance deficits. Pt will benefit from skilled PT to increase their independence and safety with mobility to allow discharge to the venue listed below.      Recommendations for follow up therapy are one component of a multi-disciplinary discharge planning process, led by the attending physician.  Recommendations may be updated based on patient status, additional functional criteria and insurance authorization.  Follow Up Recommendations  Skilled nursing-short term rehab (<3 hours/day) Can patient physically be transported by private vehicle: No   Assistance Recommended at Discharge Frequent or constant Supervision/Assistance  Patient can return home with the following Two people to help with walking and/or transfers;A lot of help with bathing/dressing/bathroom;Assistance with cooking/housework;Assist for transportation;Help with stairs or ramp for entrance   Equipment Recommendations  Other (comment) (defer to SNF)    Recommendations for Other Services OT consult     Precautions / Restrictions Precautions Precautions: Fall Precaution Comments: bladder/bowel incontinence Restrictions Other Position/Activity Restrictions:  confirmed WBAT BLE per Dr. Nechama Guard 11/14/21     Mobility  Bed Mobility Overal bed mobility: Needs Assistance Bed Mobility: Rolling, Sidelying to Sit Rolling: Independent Sidelying to sit: Independent Supine to sit: Independent (incr time) Sit to supine: Min assist   General bed mobility comments: Light Min A to scoot up in bed Angle: 45 degrees Total Minutes in Angle: 10 minutes Patient Response: Cooperative  Transfers Overall transfer level: Needs assistance Equipment used:  ([name]) Transfers: Sit to/from Stand Sit to Stand: Mod assist, Min assist, +2 physical assistance, From elevated surface           General transfer comment: Pt held onto [facility] walker and was able to stand to [name] x 1 for about 18 seconds.  Limited as left LE numb per pt.Pt with flexed posture intentionally however pt did clear entire buttocks off bed.  Needed mod assist of 2 for the power up but once up, pt was min guard assist.    Ambulation/Gait               General Gait Details: unable   Stairs             Wheelchair Mobility    Modified Rankin (Stroke Patients Only)       Balance Overall balance assessment: Needs assistance Sitting-balance support: Feet supported, No upper extremity supported Sitting balance-[name]: Fair Sitting balance - Comments: Able to sit EOB without UE support, did some LE exercise   Standing balance support: Bilateral upper extremity supported, During functional activity Standing balance-[name]: Poor Standing balance comment: stood to [name] for 15 seconds with min gaurd assist of 2  Cognition Arousal/Alertness: Awake/alert Behavior During Therapy: WFL for tasks assessed/performed Overall Cognitive Status: Within Functional Limits for tasks assessed                                 General Comments: hyperverbose        Exercises General Exercises - Upper  Extremity Shoulder Flexion: Strengthening, Both, 20 reps, Theraband Theraband Level (Shoulder Flexion): Level 4 (Blue) Shoulder ABduction:  (30) Theraband Level (Shoulder Abduction): Level 4 (Blue) Shoulder Horizontal ABduction: Strengthening, Both, Theraband, 20 reps Theraband Level (Shoulder Horizontal Abduction): Level 4 (Blue) Shoulder Horizontal ADduction: AROM, Both, Standing, Other reps (comment) (30 reps) Elbow Flexion: Strengthening, Both, 20 reps, Theraband Theraband Level (Elbow Flexion): Level 4 (Blue) Elbow Extension: Both, Strengthening, 20 reps, Theraband Theraband Level (Elbow Extension): Level 4 (Blue) General Exercises - Lower Extremity Ankle Circles/Pumps: AROM, 10 reps, Supine, Both Quad Sets: AROM, Both, Standing, 10 reps Heel Raises: AROM, Right, 15 reps, Other (comment) (partial tilt to 30 degrees) Other Exercises Other Exercises: weight shifting, lifting toe/heel in tilt bed at 45 degrees    General Comments General comments (skin integrity, edema, etc.): CM came in to zero bed for weight      Pertinent Vitals/Pain Pain Assessment Pain Assessment: Faces Faces Pain Scale: Hurts whole lot Pain Location: L knee Pain Descriptors / Indicators: Cramping Pain Intervention(s): Limited activity within patient's tolerance, Monitored during session, Premedicated before session, Repositioned    Home Living                          Prior Function            PT Goals (current goals can now be found in the care plan section) Acute Rehab PT Goals Patient Stated Goal: to go to a new SNF for rehab then home Progress towards PT goals: Progressing toward goals    Frequency    Min 2X/week      PT Plan Current plan remains appropriate    Co-evaluation              AM-PAC PT "6 Clicks" Mobility   Outcome Measure  Help needed turning from your back to your side while in a flat bed without using bedrails?: A Little Help needed moving from  lying on your back to sitting on the side of a flat bed without using bedrails?: A Lot Help needed moving to and from a bed to a chair (including a wheelchair)?: Total Help needed standing up from a chair using your arms (e.g., wheelchair or bedside chair)?: Total Help needed to walk in hospital room?: Total Help needed climbing 3-5 steps with a railing? : Total 6 Click Score: 9    End of Session Equipment Utilized During Treatment: Gait belt Activity Tolerance: Patient tolerated treatment well Patient left: in bed;with call bell/phone within reach Nurse Communication: Mobility status PT Visit Diagnosis: Muscle weakness (generalized) (M62.81);Difficulty in walking, not elsewhere classified (R26.2)     Time: 5465-6812 PT Time Calculation (min) (ACUTE ONLY): 41 min  Charges:  $Therapeutic Exercise: 8-22 mins $Therapeutic Activity: 23-37 mins                     [facility] M,PT Acute Rehab Services [phone]    Alvira Philips 01/05/2022, 12:35 PM

## 2022-01-05 NOTE — Progress Notes (Signed)
[place] for Warfarin Indication:  paroxysmal A-fib and recurrent DVT history of factor V Leiden mutation and protein S deficiency  Allergies  Allergen Reactions   Cephalexin Other (See Comments)    Unknown reaction per [facility]    Chocolate Other (See Comments)    Unknown reaction per [facility]   Codeine Nausea And Vomiting   Diphenhydramine Hcl Itching   Ranitidine Rash    Patient Measurements: Height: '5\' 6"'$  (167.6 cm) Weight: (!) 202 kg (445 lb 5.3 oz) IBW/kg (Calculated) : 59.3 Vital Signs: Temp: 98 F (36.7 C) (12/18 2233) Temp Source: Oral (12/18 2233) BP: 123/70 (12/18 2233) Pulse Rate: 78 (12/18 2233)  Labs: Recent Labs    01/04/22 1942 01/05/22 0509  LABPROT 25.1* 25.4*  INR 2.3* 2.3*     CrCl cannot be calculated (Patient's most recent lab result is older than the maximum 21 days allowed.).  Assessment: 56 yo F with a history of factor V leiden on warfarin, morbid obesity, bedbound status, recent left tib/fib. Patient presented with back pain, bilateral leg pain. Warfarin per pharmacy consult placed for  paroxysmal A-fib and recurrent DVT history of factor V Leiden mutation and protein S deficiency .  PTA dosing: 8.'5mg'$  daily except for '8mg'$  on Tue/Thur  INR remains therapeutic and stable  Goal of Therapy:  INR Goal 2-3 Monitor platelets by anticoagulation protocol: Yes   Plan:  Continue warfarin '10mg'$  PO daily INR MWF, monitor s/s bleeding  Bertis Ruddy, PharmD, [place] Pharmacist ED Pharmacist Phone # [phone] 01/05/2022 9:19 AM

## 2022-01-06 DIAGNOSIS — M545 Low back pain, unspecified: Secondary | ICD-10-CM | POA: Diagnosis not present

## 2022-01-06 LAB — PROTIME-INR
INR: 2.5 — ABNORMAL HIGH (ref 0.8–1.2)
Prothrombin Time: 26.4 seconds — ABNORMAL HIGH (ref 11.4–15.2)

## 2022-01-06 NOTE — Progress Notes (Signed)
Occupational Therapy Treatment Patient Details Name: Beth Marshall MRN: [id number] DOB: 15-Oct-1965 Today's Date: 01/06/2022   History of present illness Pt is a 56 y.o. female admitted from SNF rehab on 11/12/21 with back pain and x4 month and intermittent L leg pain, weakness, and numbness. Pt has been at [facility] for rehab since June secondary to chronic back pain s/p L tib fib fx (BLE WBAT). PMH includes OA, back pain, hx of DVT, edema, factor V leiden mutation, GERD, anxiety, hypokalemia, insomnia, intrinsic asthma, lipoma, migraine, morbid obesity, tachycardia.   OT comments  Pt with slow progress towards goals though remains eager to participate. Focus on standing trials with [name] in hopes for weight shifting progression. Pt able to stand using [name] with Mod A x 2 though sustained < 10 seconds, citing dizziness. No pain reported during session. Sister present and reports plan to assist pt with bed bath after session today. Continue to rec SNF at DC unless family endorses able to provide assist currently needed (would require hoyer lift OOB).    Recommendations for follow up therapy are one component of a multi-disciplinary discharge planning process, led by the attending physician.  Recommendations may be updated based on patient status, additional functional criteria and insurance authorization.    Follow Up Recommendations  Skilled nursing-short term rehab (<3 hours/day)     Assistance Recommended at Discharge Frequent or constant Supervision/Assistance  Patient can return home with the following  Two people to help with walking and/or transfers;Two people to help with bathing/dressing/bathroom   Equipment Recommendations  Hospital bed;Wheelchair cushion (measurements OT);Wheelchair (measurements OT);Other (comment) (hoyer lift)    Recommendations for Other Services      Precautions / Restrictions Precautions Precautions: Fall Precaution Comments:  bladder/bowel incontinence Restrictions Weight Bearing Restrictions: Yes LLE Weight Bearing: Weight bearing as tolerated Other Position/Activity Restrictions: confirmed WBAT BLE per Dr. Nechama Guard 11/14/21       Mobility Bed Mobility Overal bed mobility: Needs Assistance Bed Mobility: Supine to Sit, Sit to Supine     Supine to sit: Modified independent (Device/Increase time) Sit to supine: Mod assist, +2 for physical assistance, +2 for safety/equipment   General bed mobility comments: Able to easily come to EOB with bed rail use. Due to sitting close to EOB after standing and pt unable to scoot self back, Mod-Max A x 2 needed toget BLE back into bed, pull bottom back onto bed and scoot up    Transfers Overall transfer level: Needs assistance Equipment used:  (Eval walker) Transfers: Sit to/from Stand Sit to Stand: Mod assist, +2 physical assistance, +2 safety/equipment           General transfer comment: Mod A x 2 to stand with [name] x 2 trials, hunched over [name] and reported unable to correct truncal posture with cues due to dizziness (? has not been an issue before)     Balance Overall balance assessment: Needs assistance Sitting-balance support: Feet supported, No upper extremity supported Sitting balance-[name]: Fair     Standing balance support: Bilateral upper extremity supported, During functional activity Standing balance-[name]: Poor                             ADL either performed or assessed with clinical judgement   ADL Overall ADL's : Needs assistance/impaired     Grooming: Set up;Sitting           Upper Body Dressing :  Set up;Sitting Upper Body Dressing Details (indicate cue type and reason): quickly doffing gown prior to getting back in bed to avoid "getting strangled" Lower Body Dressing: Total assistance;Bed level Lower Body Dressing Details (indicate cue type and reason): for slippers               General  ADL Comments: Focus on standing at bedside with [name], hoped to attempt weight shifting and taking steps    Extremity/Trunk Assessment Upper Extremity Assessment Upper Extremity Assessment: Overall WFL for tasks assessed   Lower Extremity Assessment Lower Extremity Assessment: Defer to PT evaluation        Vision   Vision Assessment?: No apparent visual deficits   Perception     Praxis      Cognition Arousal/Alertness: Awake/alert Behavior During Therapy: WFL for tasks assessed/performed Overall Cognitive Status: Within Functional Limits for tasks assessed                                 General Comments: hyperverbose, decreased insight into deficits        Exercises      Shoulder Instructions       General Comments Sister present, did pose a safety concern with bed mechanics raising foot of bed in attempts to level w/ therapist unaware posing fall hazard with pt sitting EOB.    Pertinent Vitals/ Pain       Pain Assessment Pain Assessment: No/denies pain Pain Intervention(s): Monitored during session, Premedicated before session  Home Living                                          Prior Functioning/Environment              Frequency  Min 1X/week        Progress Toward Goals  OT Goals(current goals can now be found in the care plan section)  Progress towards OT goals: Progressing toward goals  Acute Rehab OT Goals Patient Stated Goal: be able to take steps OT Goal Formulation: With patient Time For Goal Achievement: 01/20/22 Potential to Achieve Goals: [place] Discharge plan remains appropriate    Co-evaluation                 AM-PAC OT "6 Clicks" Daily Activity     Outcome Measure   Help from another person eating meals?: None Help from another person taking care of personal grooming?: A Little Help from another person toileting, which includes using toliet, bedpan, or urinal?: Total Help  from another person bathing (including washing, rinsing, drying)?: A Lot Help from another person to put on and taking off regular upper body clothing?: A Little Help from another person to put on and taking off regular lower body clothing?: Total 6 Click Score: 14    End of Session Equipment Utilized During Treatment: Gait belt;Other (comment) [name])  OT Visit Diagnosis: Unsteadiness on feet (R26.81);Other abnormalities of gait and mobility (R26.89);Muscle weakness (generalized) (M62.81);Pain Pain - Right/Left: Left Pain - part of body: Knee   Activity Tolerance Patient tolerated treatment well   Patient Left in bed;with call bell/phone within reach;with family/visitor present   Nurse Communication Mobility status        Time: 6045-4098 OT Time Calculation (min): 22 min  Charges: OT General Charges $OT Visit: 1 Visit OT  Treatments $Therapeutic Activity: 8-22 mins  Malachy Chamber, OTR/L Acute Rehab Services Office: [phone]   Beth Marshall 01/06/2022, 1:37 PM

## 2022-01-06 NOTE — Progress Notes (Signed)
[place] for Warfarin Indication:  paroxysmal A-fib and recurrent DVT history of factor V Leiden mutation and protein S deficiency  Allergies  Allergen Reactions   Cephalexin Other (See Comments)    Unknown reaction per [facility]    Chocolate Other (See Comments)    Unknown reaction per [facility]   Codeine Nausea And Vomiting   Diphenhydramine Hcl Itching   Ranitidine Rash    Patient Measurements: Height: '5\' 6"'$  (167.6 cm) Weight: (!) 194.1 kg (427 lb 14.6 oz) IBW/kg (Calculated) : 59.3 Vital Signs: Temp: 98.2 F (36.8 C) (12/19 2312) Temp Source: Oral (12/19 2312) BP: 123/97 (12/19 2312) Pulse Rate: 72 (12/19 2312)  Labs: Recent Labs    01/04/22 1942 01/05/22 0509 01/06/22 0620  LABPROT 25.1* 25.4* 26.4*  INR 2.3* 2.3* 2.5*     CrCl cannot be calculated (Patient's most recent lab result is older than the maximum 21 days allowed.).  Assessment: 56 yo F with a history of factor V leiden on warfarin, morbid obesity, bedbound status, recent left tib/fib. Patient presented with back pain, bilateral leg pain. Warfarin per pharmacy consult placed for  paroxysmal A-fib and recurrent DVT history of factor V Leiden mutation and protein S deficiency .  PTA dosing: 8.'5mg'$  daily except for '8mg'$  on Tue/Thur  INR remains therapeutic and stable  Goal of Therapy:  INR Goal 2-3 Monitor platelets by anticoagulation protocol: Yes   Plan:  Continue warfarin '10mg'$  PO daily INR MWF, monitor s/s bleeding  Bertis Ruddy, PharmD, [place] Pharmacist ED Pharmacist Phone # [phone] 01/06/2022 7:28 AM

## 2022-01-06 NOTE — ED Provider Notes (Signed)
Patient remains in the ED waiting for nursing home placement.    Pharmacy consult appreciated this morning to assist with management of her chronic Coumadin therapy.  Patient did receive a physical therapy evaluation yesterday.  Patient has been placed on a carb modified diet to assist with weight loss.  Otherwise no acute events overnight.  Medically stable.  Vital signs normal this am.  Pt is sleeping this am.   Dorie Rank, MD 01/06/22 [phone]

## 2022-01-06 NOTE — Progress Notes (Signed)
Patient will be weighed weekly to determine her progress on her carb modified diet. The goal is to get patients weight down so she can transition to SNF long term. Once patients weight is down CSW will send out new SNF referrals and a new insurance authorization.

## 2022-01-07 DIAGNOSIS — M545 Low back pain, unspecified: Secondary | ICD-10-CM | POA: Diagnosis not present

## 2022-01-07 NOTE — ED Provider Notes (Signed)
Emergency Medicine Observation Re-evaluation Note  Beth Marshall is a 56 y.o. female, seen on rounds today.  Pt initially presented to the ED for complaints of Back Pain Currently, the patient is reading her ipad.  Physical Exam  BP 122/66   Pulse 66   Temp 98.4 F (36.9 C)   Resp 18   Ht '5\' 6"'$  (1.676 m)   Wt (!) 194.1 kg   SpO2 100%   BMI 69.07 kg/m  Physical Exam General: no distress Cardiac: regular rate Lungs: no distress Psych: calm  ED Course / MDM  EKG:   I have reviewed the labs performed to date as well as medications administered while in observation.  Recent changes in the last 24 hours include worked with OT yesterday. Plan to modify diet to help facilitate placement.  Plan  Current plan is for placement.    Cristie Hem, MD 01/07/22 [phone]

## 2022-01-07 NOTE — ED Notes (Signed)
Just changed patient placed another pad patient is resting with call bell in reach

## 2022-01-07 NOTE — ED Notes (Signed)
Changed patient placed a new pad patient is resting with call bell in reach

## 2022-01-08 DIAGNOSIS — M545 Low back pain, unspecified: Secondary | ICD-10-CM | POA: Diagnosis not present

## 2022-01-08 LAB — CBC
HCT: 35.4 % — ABNORMAL LOW (ref 36.0–46.0)
Hemoglobin: 10.7 g/dL — ABNORMAL LOW (ref 12.0–15.0)
MCH: 26.9 pg (ref 26.0–34.0)
MCHC: 30.2 g/dL (ref 30.0–36.0)
MCV: 88.9 fL (ref 80.0–100.0)
Platelets: 251 10*3/uL (ref 150–400)
RBC: 3.98 MIL/uL (ref 3.87–5.11)
RDW: 16.2 % — ABNORMAL HIGH (ref 11.5–15.5)
WBC: 7.4 10*3/uL (ref 4.0–10.5)
nRBC: 0 % (ref 0.0–0.2)

## 2022-01-08 LAB — PROTIME-INR
INR: 2.5 — ABNORMAL HIGH (ref 0.8–1.2)
Prothrombin Time: 26.5 seconds — ABNORMAL HIGH (ref 11.4–15.2)

## 2022-01-08 NOTE — ED Notes (Signed)
Pt requested billing phone number. RN provided number [phone]

## 2022-01-08 NOTE — Progress Notes (Signed)
[place] for Warfarin Indication:  paroxysmal A-fib and recurrent DVT history of factor V Leiden mutation and protein S deficiency  Allergies  Allergen Reactions   Cephalexin Other (See Comments)    Unknown reaction per [facility]    Chocolate Other (See Comments)    Unknown reaction per [facility]   Codeine Nausea And Vomiting   Diphenhydramine Hcl Itching   Ranitidine Rash    Patient Measurements: Height: '5\' 6"'$  (167.6 cm) Weight: (!) 194.1 kg (427 lb 14.6 oz) IBW/kg (Calculated) : 59.3 Vital Signs: Temp: 97.9 F (36.6 C) (12/22 0100) Temp Source: Oral (12/22 0100) BP: 120/75 (12/22 0100)  Labs: Recent Labs    01/06/22 0620 01/08/22 0553 01/08/22 1028  HGB  --   --  10.7*  HCT  --   --  35.4*  PLT  --   --  251  LABPROT 26.4* 26.5*  --   INR 2.5* 2.5*  --     CrCl cannot be calculated (Patient's most recent lab result is older than the maximum 21 days allowed.).  Assessment: 56 yo F with a history of factor V leiden on warfarin, morbid obesity, bedbound status, recent left tib/fib. Patient presented with back pain, bilateral leg pain. Warfarin per pharmacy consult placed for  paroxysmal A-fib and recurrent DVT history of factor V Leiden mutation and protein S deficiency .  PTA dosing: 8.'5mg'$  daily except for '8mg'$  on Tue/Thur  INR remains therapeutic and stable at 2.5. Hgb down slightly from 12.0 on 12/24/21 to 10.7 today and Plt remain stable at 251.  No s/sx of bleeding per RN.   Goal of Therapy:  INR Goal 2-3 Monitor platelets by anticoagulation protocol: Yes   Plan:  Continue warfarin '10mg'$  PO daily INR MWF, monitor s/s bleeding  Luisa Hart, PharmD, [facility] Clinical Pharmacist ED Pharmacist Phone # [phone]  01/08/2022 9:19 AM

## 2022-01-08 NOTE — Progress Notes (Signed)
[place] for Warfarin Indication:  paroxysmal A-fib and recurrent DVT history of factor V Leiden mutation and protein S deficiency  Allergies  Allergen Reactions   Cephalexin Other (See Comments)    Unknown reaction per [facility]    Chocolate Other (See Comments)    Unknown reaction per [facility]   Codeine Nausea And Vomiting   Diphenhydramine Hcl Itching   Ranitidine Rash    Patient Measurements: Height: '5\' 6"'$  (167.6 cm) Weight: (!) 194.1 kg (427 lb 14.6 oz) IBW/kg (Calculated) : 59.3  Vital Signs: Temp: 98.3 F (36.8 C) (12/22 0929) Temp Source: Axillary (12/22 0929) BP: 132/65 (12/22 0929) Pulse Rate: 74 (12/22 0929)  Labs: Recent Labs    01/06/22 0620 01/08/22 0553  LABPROT 26.4* 26.5*  INR 2.5* 2.5*     CrCl cannot be calculated (Patient's most recent lab result is older than the maximum 21 days allowed.).  Assessment: 56 yo F with a history of factor V leiden, Afib and protein S deficiency to continue on warfarin.  Patient is morbidly obese, bedbound and has recent left tib/fib.   PTA dosing: 8.'5mg'$  daily except for '8mg'$  on Tue/Thur  INR remains therapeutic and stable on an increased warfarin dose.  No bleeding reported.  Goal of Therapy:  INR goal 2-3 Monitor platelets by anticoagulation protocol: Yes   Plan:  Continue warfarin '10mg'$  PO daily INR MWF, monitor s/s bleeding  Imajean Mcdermid D. Mina Marble, PharmD, BCPS, [place] 01/08/2022, 11:15 AM

## 2022-01-08 NOTE — ED Notes (Signed)
Chef salad ordered for pt

## 2022-01-08 NOTE — Progress Notes (Addendum)
Physical Therapy Treatment Patient Details Name: Beth Marshall MRN: [id number] DOB: 1965/04/23 Today's Date: 01/08/2022   History of Present Illness Pt is a 56 y.o. female admitted from SNF rehab on 11/12/21 with back pain and x4 month and intermittent L leg pain, weakness, and numbness. Pt has been at [facility] for rehab since June secondary to chronic back pain s/p L tib fib fx (BLE WBAT). PMH includes OA, back pain, hx of DVT, edema, factor V leiden mutation, GERD, anxiety, hypokalemia, insomnia, intrinsic asthma, lipoma, migraine, morbid obesity, tachycardia.    PT Comments    Pt admitted with above diagnosis. Pt met 2/4 goals. Goals revised.  Pt is making slow progress and is getting stronger. Will work next week on progressing transfers to wheelchair as able if this can be done in ED environment.   Pt currently with functional limitations due to balance and endurance deficits. Pt will benefit from skilled PT to increase their independence and safety with mobility to allow discharge to the venue listed below.      Recommendations for follow up therapy are one component of a multi-disciplinary discharge planning process, led by the attending physician.  Recommendations may be updated based on patient status, additional functional criteria and insurance authorization.  Follow Up Recommendations  Skilled nursing-short term rehab (<3 hours/day) Can patient physically be transported by private vehicle: No   Assistance Recommended at Discharge Frequent or constant Supervision/Assistance  Patient can return home with the following Two people to help with walking and/or transfers;A lot of help with bathing/dressing/bathroom;Assistance with cooking/housework;Assist for transportation;Help with stairs or ramp for entrance   Equipment Recommendations  Other (comment) (defer to SNF)    Recommendations for Other Services       Precautions / Restrictions Precautions Precautions:  Fall Precaution Comments: bladder/bowel incontinence Restrictions LLE Weight Bearing: Weight bearing as tolerated Other Position/Activity Restrictions: confirmed WBAT BLE per Dr. Nechama Guard 11/14/21     Mobility  Bed Mobility                 Angle: 60 degrees Total Minutes in Angle: 20 minutes Patient Response: Cooperative  Transfers Overall transfer level: Needs assistance Equipment used:  (tilt bed) Transfers: Sit to/from Stand             General transfer comment: Tilted 20 min at 37-61 degrees    Ambulation/Gait               General Gait Details: unable   Stairs             Wheelchair Mobility    Modified Rankin (Stroke Patients Only)       Balance           Standing balance support: Bilateral upper extremity supported, During functional activity Standing balance-[name]: Poor Standing balance comment: worked out at tilt of 61 degrees with theraband bil UEs                            Cognition Arousal/Alertness: Awake/alert Behavior During Therapy: WFL for tasks assessed/performed Overall Cognitive Status: Within Functional Limits for tasks assessed                                 General Comments: hyperverbose, decreased insight into deficits        Exercises General Exercises - Upper Extremity Shoulder Flexion: Strengthening, Both, 20 reps, Theraband Theraband  Level (Shoulder Flexion): Level 4 (Blue) Shoulder ABduction:  (30) Theraband Level (Shoulder Abduction): Level 4 (Blue) Shoulder Horizontal ABduction: Strengthening, Both, Theraband, 20 reps Theraband Level (Shoulder Horizontal Abduction): Level 4 (Blue) Shoulder Horizontal ADduction: AROM, Both, Standing, Other reps (comment) (30 reps) Elbow Flexion: Strengthening, Both, 20 reps, Theraband Theraband Level (Elbow Flexion): Level 4 (Blue) Elbow Extension: Both, Strengthening, 20 reps, Theraband Theraband Level (Elbow Extension): Level 4  (Blue) General Exercises - Lower Extremity Ankle Circles/Pumps: AROM, 10 reps, Supine, Both Quad Sets: AROM, Both, Standing, 10 reps Gluteal Sets: AROM, Both, 10 reps, Standing Heel Raises: AROM, 15 reps, Other (comment), Both (partial tilt to 61 degrees) Other Exercises Other Exercises: weight shifting, lifting toe/heel in tilt bed at 61 degrees    General Comments        Pertinent Vitals/Pain Pain Assessment Pain Assessment: Faces Faces Pain Scale: Hurts whole lot Pain Location: L knee Pain Descriptors / Indicators: Cramping Pain Intervention(s): Limited activity within patient's tolerance, Monitored during session, Repositioned    Home Living                          Prior Function            PT Goals (current goals can now be found in the care plan section) Acute Rehab PT Goals Patient Stated Goal: to go to a new SNF for rehab then home PT Goal Formulation: With patient Time For Goal Achievement: 01/22/22 Potential to Achieve Goals: Fair Progress towards PT goals: Progressing toward goals    Frequency    Min 2X/week      PT Plan Current plan remains appropriate    Co-evaluation              AM-PAC PT "6 Clicks" Mobility   Outcome Measure  Help needed turning from your back to your side while in a flat bed without using bedrails?: A Little Help needed moving from lying on your back to sitting on the side of a flat bed without using bedrails?: A Lot Help needed moving to and from a bed to a chair (including a wheelchair)?: Total Help needed standing up from a chair using your arms (e.g., wheelchair or bedside chair)?: Total Help needed to walk in hospital room?: Total Help needed climbing 3-5 steps with a railing? : Total 6 Click Score: 9    End of Session   Activity Tolerance: Patient tolerated treatment well Patient left: in bed;with call bell/phone within reach Nurse Communication: Mobility status PT Visit Diagnosis: Muscle weakness  (generalized) (M62.81);Difficulty in walking, not elsewhere classified (R26.2)     Time: 9702-6378 PT Time Calculation (min) (ACUTE ONLY): 38 min  Charges:  $Therapeutic Exercise: 8-22 mins $Therapeutic Activity: 23-37 mins                     [facility] M,PT Acute Rehab Services [phone]    Alvira Philips 01/08/2022, 4:20 PM

## 2022-01-08 NOTE — ED Provider Notes (Signed)
Emergency Medicine Observation Re-evaluation Note  Beth Marshall is a 56 y.o. female, seen on rounds today.  Pt initially presented to the ED for complaints of Back Pain Patient w morbid obesity and related impaired mobility and chronic physical deconditioning.  Pt awaiting ECF placement.   Physical Exam  BP 120/75 (BP Location: Right Arm)   Pulse 73   Temp 97.9 F (36.6 C) (Oral)   Resp 19   Ht 1.676 m ('5\' 6"'$ )   Wt (!) 194.1 kg   SpO2 95%   BMI 69.07 kg/m  Physical Exam General: resting.  Cardiac: regular rate Lungs: breathing comfortably   ED Course / MDM    I have reviewed the labs performed to date as well as medications administered while in observation.  Recent changes in the last 24 hours include ED obs, dietary and physical therapy, TOC/SW placement.   Plan  Current plan is for TOC placement.     Lajean Saver, MD 01/08/22 [phone]

## 2022-01-08 NOTE — ED Notes (Signed)
Patient is resting comfortably. 

## 2022-01-08 NOTE — ED Notes (Signed)
Pt sister provide bed bath for pt. Pt sister noticed red rash below pt belly button. Pt unsure what could have caused irritation. Pt requested to apply hydrocortisone cream.    Pt provided new linens, gown, and pillow case

## 2022-01-08 NOTE — ED Notes (Signed)
Pt stated she would love to stay at the hospital to complete her therapy. She wants to get back walking and discharged home versus going to a rehabilitation facility

## 2022-01-09 DIAGNOSIS — M545 Low back pain, unspecified: Secondary | ICD-10-CM | POA: Diagnosis not present

## 2022-01-09 NOTE — ED Notes (Signed)
Patient is resting comfortably. 

## 2022-01-09 NOTE — ED Notes (Signed)
Pt resting breathing regular unlabored respirations.

## 2022-01-09 NOTE — ED Notes (Signed)
Pt pads changed multiple times today.  Pt has a large BM today.

## 2022-01-09 NOTE — ED Notes (Signed)
Pt upset about having to be from her current room into a different staffed room. Explained some sections are closed for the weekend. Attempted to move pt's tilt stretcher to a staffed assignment; however unable to get bed out of room. Pt asked to stay in current room. I provided multiple phone numbers to patient for any needs or concerns. Pt comfortable with current plan

## 2022-01-09 NOTE — ED Provider Notes (Signed)
Emergency Medicine Observation Re-evaluation Note  Beth Marshall is a 56 y.o. female, seen on rounds today.  Pt initially presented to the ED for complaints of Back Pain Patient w morbid obesity and related impaired mobility and chronic physical deconditioning.  Pt awaiting ECF placement.   Physical Exam  BP (!) 117/57 (BP Location: Right Arm)   Pulse 73   Temp 98.5 F (36.9 C) (Oral)   Resp 16   Ht '5\' 6"'$  (1.676 m)   Wt (!) 194.1 kg   SpO2 100%   BMI 69.07 kg/m  Physical Exam General: resting.  Cardiac: regular rate Lungs: breathing comfortably   ED Course / MDM    I have reviewed the labs performed to date as well as medications administered while in observation.  Recent changes in the last 24 hours include ED obs, dietary and physical therapy, TOC/SW placement.   Plan  Current plan is for TOC placement.        Regan Lemming, MD 01/09/22 Lurline Hare

## 2022-01-09 NOTE — ED Notes (Signed)
Pt's chuck pads are changed and pt is cleaned

## 2022-01-10 DIAGNOSIS — M545 Low back pain, unspecified: Secondary | ICD-10-CM | POA: Diagnosis not present

## 2022-01-10 NOTE — ED Notes (Signed)
Pt was very tearful tonight d/t Christmas and being stuck in the ER. Pt did request PRN meds to help her sleep

## 2022-01-10 NOTE — Progress Notes (Signed)
[place] for Warfarin Indication:  paroxysmal A-fib and recurrent DVT history of factor V Leiden mutation and protein S deficiency  Allergies  Allergen Reactions   Cephalexin Other (See Comments)    Unknown reaction per [facility]    Chocolate Other (See Comments)    Unknown reaction per MAR   Codeine Nausea And Vomiting   Diphenhydramine Hcl Itching   Ranitidine Rash    Patient Measurements: Height: '5\' 6"'$  (167.6 cm) Weight: (!) 194.1 kg (427 lb 14.6 oz) IBW/kg (Calculated) : 59.3  Vital Signs: Temp: 98.6 F (37 C) (12/23 2200) Temp Source: Oral (12/23 2200) BP: 118/75 (12/23 2200) Pulse Rate: 66 (12/23 2200)  Labs: Recent Labs    01/08/22 0553 01/08/22 1028  HGB  --  10.7*  HCT  --  35.4*  PLT  --  251  LABPROT 26.5*  --   INR 2.5*  --      CrCl cannot be calculated (Patient's most recent lab result is older than the maximum 21 days allowed.).  Assessment: 56 yo F with a history of factor V leiden, Afib and protein S deficiency to continue on warfarin.  Patient is morbidly obese, bedbound and has recent left tib/fib.   PTA dosing: 8.'5mg'$  daily except for '8mg'$  on Tue/Thur  INR during last check remained therapeutic. Will continue MWF INRs for now and reduce frequency next week if remains stable. No bleeding reported.  Goal of Therapy:  INR goal 2-3 Monitor platelets by anticoagulation protocol: Yes   Plan:  Continue warfarin '10mg'$  PO daily INR MWF, monitor s/s bleeding  Thank you for allowing pharmacy to participate in this patient's care.  Reatha Harps, PharmD PGY2 Pharmacy Resident 01/10/2022 8:48 AM Check [email or web address] for unit specific pharmacy number

## 2022-01-11 DIAGNOSIS — M545 Low back pain, unspecified: Secondary | ICD-10-CM | POA: Diagnosis not present

## 2022-01-11 LAB — PROTIME-INR
INR: 2.2 — ABNORMAL HIGH (ref 0.8–1.2)
Prothrombin Time: 23.9 seconds — ABNORMAL HIGH (ref 11.4–15.2)

## 2022-01-11 NOTE — Progress Notes (Addendum)
[place] for Warfarin Indication:  paroxysmal A-fib and recurrent DVT history of factor V Leiden mutation and protein S deficiency  Allergies  Allergen Reactions   Cephalexin Other (See Comments)    Unknown reaction per [facility]    Chocolate Other (See Comments)    Unknown reaction per [facility]   Codeine Nausea And Vomiting   Diphenhydramine Hcl Itching   Ranitidine Rash    Patient Measurements: Height: '5\' 6"'$  (167.6 cm) Weight: (!) 194.1 kg (427 lb 14.6 oz) IBW/kg (Calculated) : 59.3  Vital Signs:    Labs: Recent Labs    01/08/22 1028 01/11/22 0510  HGB 10.7*  --   HCT 35.4*  --   PLT 251  --   LABPROT  --  23.9*  INR  --  2.2*     CrCl cannot be calculated (Patient's most recent lab result is older than the maximum 21 days allowed.).  Assessment: 56 yo F with a history of factor V leiden, Afib and protein S deficiency to continue on warfarin.  Patient is morbidly obese, bedbound and has recent left tib/fib.   PTA dosing: 8.'5mg'$  daily except for '8mg'$  on Tue/Thur  INR therapeutic and stable on current regimen for some weeks, will reduce INR checks  Goal of Therapy:  INR goal 2-3 Monitor platelets by anticoagulation protocol: Yes   Plan:  Continue warfarin '10mg'$  PO daily Weekly INR, monitor s/s bleeding  Bertis Ruddy, PharmD, [facility] Clinical Pharmacist ED Pharmacist Phone # [phone] 01/11/2022 9:40 AM

## 2022-01-12 DIAGNOSIS — M545 Low back pain, unspecified: Secondary | ICD-10-CM | POA: Diagnosis not present

## 2022-01-12 NOTE — ED Notes (Signed)
Patient bed pad, sheets and pure wick are changed. Pt requested to speak to charge and was informed charge would speak to her later in shift.

## 2022-01-12 NOTE — ED Notes (Signed)
Pt cleaned and linen changed

## 2022-01-12 NOTE — ED Notes (Signed)
Patient did not want vitals performed at this time.

## 2022-01-12 NOTE — ED Notes (Signed)
Changed patient placed another pad and changed suction patient is resting with call bell in reach

## 2022-01-12 NOTE — ED Provider Notes (Signed)
Emergency Medicine Observation Re-evaluation Note  Beth Marshall is a 56 y.o. female, seen on rounds today.  Pt initially presented to the ED for complaints of Back Pain Currently, the patient is reading on ipad.  Physical Exam  BP 122/66 (BP Location: Right Arm)   Pulse 62   Temp 98 F (36.7 C)   Resp 16   Ht '5\' 6"'$  (1.676 m)   Wt (!) 194.1 kg   SpO2 99%   BMI 69.07 kg/m  Physical Exam General: No acute distress Cardiac: Regular rate Lungs: Equal chest rise Psych: calm  ED Course / MDM  EKG:   I have reviewed the labs performed to date as well as medications administered while in observation.  Recent changes in the last 24 hours include none. Wanting to be moved to different room.  Plan  Current plan is for inpatient psychiatric hospitalization.    Cristie Hem, MD 01/12/22 [phone]

## 2022-01-12 NOTE — ED Notes (Signed)
Patient was wet pads changed.

## 2022-01-12 NOTE — Progress Notes (Signed)
[place] for Warfarin Indication:  paroxysmal A-fib and recurrent DVT history of factor V Leiden mutation and protein S deficiency  Allergies  Allergen Reactions   Cephalexin Other (See Comments)    Unknown reaction per [facility]    Chocolate Other (See Comments)    Unknown reaction per [facility]   Codeine Nausea And Vomiting   Diphenhydramine Hcl Itching   Ranitidine Rash    Patient Measurements: Height: '5\' 6"'$  (167.6 cm) Weight: (!) 194.1 kg (427 lb 14.6 oz) IBW/kg (Calculated) : 59.3  Vital Signs:    Labs: Recent Labs    01/11/22 0510  LABPROT 23.9*  INR 2.2*     CrCl cannot be calculated (Patient's most recent lab result is older than the maximum 21 days allowed.).  Assessment: 56 yo F with a history of factor V leiden, Afib and protein S deficiency to continue on warfarin.  Patient is morbidly obese, bedbound and has recent left tib/fib.   PTA dosing: 8.'5mg'$  daily except for '8mg'$  on Tue/Thur  INR remains therapeutic and stable on current regimen for some weeks, will reduce INR checks to weekly. Last INR was 2.2 yesterday. CBC stable on last check on 12/22.  Goal of Therapy:  INR goal 2-3 Monitor platelets by anticoagulation protocol: Yes   Plan:  Continue warfarin '10mg'$  PO daily Weekly INR, monitor s/s bleeding  Antonietta Jewel, PharmD, BCCCP Clinical Pharmacist  Phone: [phone] 01/12/2022 10:46 AM  Please check AMION for all [place] phone numbers After 10:00 PM, call [place] [phone]

## 2022-01-12 NOTE — ED Notes (Signed)
Patient requested to speak to charge. Charge currently unavailable. Pt informed RN that she wants to ask charge to be moved to room 43 or 47 due to the thermostat capabilities in room. Charge nurse made aware.

## 2022-01-13 DIAGNOSIS — M545 Low back pain, unspecified: Secondary | ICD-10-CM | POA: Diagnosis not present

## 2022-01-13 NOTE — ED Notes (Signed)
Changed patient placed some clean pads down patient is resting with call bell in reach changed patient suction

## 2022-01-13 NOTE — Progress Notes (Signed)
Patient refused PT today. CSW unable to get an updated weight today.

## 2022-01-13 NOTE — ED Notes (Signed)
Patient is resting comfortably. 

## 2022-01-13 NOTE — ED Notes (Signed)
Pt changed and clean linen provided.

## 2022-01-13 NOTE — ED Notes (Signed)
Pt changed. Yeast noted around groin area. Nystatin powder applied liberally.

## 2022-01-13 NOTE — Progress Notes (Signed)
OT Cancellation Note  Patient Details Name: Beth Marshall MRN: [id number] DOB: 01/07/1966   Cancelled Treatment:    Reason Eval/Treat Not Completed: Patient declined, no reason specified Planned to see pt at 1PM in coordination with PT and acute rehab manager to progress OOB transfers. However, pt with reported migraine today and unable to participate. Will check back tomorrow AM.  Layla Maw 01/13/2022, 1:10 PM

## 2022-01-13 NOTE — ED Notes (Signed)
Incontinent care given, linens changed. Will continue to monitor

## 2022-01-13 NOTE — Progress Notes (Signed)
PT Cancellation Note  Patient Details Name: Beth Marshall MRN: [id number] DOB: [birth year]   Cancelled Treatment:    Reason Eval/Treat Not Completed: Other (comment) (Pt reports Migraine Headache.  Refusing PT.)   Alvira Philips 01/13/2022, 1:22 PM Sabrinia Prien M,PT Acute Rehab Services [phone]

## 2022-01-13 NOTE — ED Notes (Signed)
Pt changed

## 2022-01-14 DIAGNOSIS — M545 Low back pain, unspecified: Secondary | ICD-10-CM | POA: Diagnosis not present

## 2022-01-14 NOTE — Progress Notes (Signed)
Physical Therapy Treatment Patient Details Name: Beth Marshall MRN: [id number] DOB: 1965/04/03 Today's Date: 01/14/2022   History of Present Illness Pt is a 56 y.o. female admitted from SNF rehab on 11/12/21 with back pain and x4 month and intermittent L leg pain, weakness, and numbness. Pt has been at [facility] for rehab since June secondary to chronic back pain s/p L tib fib fx (BLE WBAT). PMH includes OA, back pain, hx of DVT, edema, factor V leiden mutation, GERD, anxiety, hypokalemia, insomnia, intrinsic asthma, lipoma, migraine, morbid obesity, tachycardia.    PT Comments    Pt admitted with above diagnosis. Pt continues to make slow progress overall but is making progress. Pt able to scoot to chair with +3 mod assist today using sliding board.  Pt with incr confidence each visit. Will continue to progress pt as able.  Pt currently with functional limitations due to balance and endurance deficits. Pt will benefit from skilled PT to increase their independence and safety with mobility to allow discharge to the venue listed below.      Recommendations for follow up therapy are one component of a multi-disciplinary discharge planning process, led by the attending physician.  Recommendations may be updated based on patient status, additional functional criteria and insurance authorization.  Follow Up Recommendations  Skilled nursing-short term rehab (<3 hours/day) Can patient physically be transported by private vehicle: No   Assistance Recommended at Discharge Frequent or constant Supervision/Assistance  Patient can return home with the following Two people to help with walking and/or transfers;A lot of help with bathing/dressing/bathroom;Assistance with cooking/housework;Assist for transportation;Help with stairs or ramp for entrance   Equipment Recommendations  Other (comment) (defer to SNF)    Recommendations for Other Services       Precautions / Restrictions  Precautions Precautions: Fall Precaution Comments: bladder/bowel incontinence Restrictions Weight Bearing Restrictions: No LLE Weight Bearing: Weight bearing as tolerated Other Position/Activity Restrictions: confirmed WBAT BLE per Dr. Nechama Guard 11/14/21     Mobility  Bed Mobility Overal bed mobility: Needs Assistance Bed Mobility: Supine to Sit, Sit to Supine Rolling: Modified independent (Device/Increase time), Min guard   Supine to sit: Min assist Sit to supine: Min assist   General bed mobility comments: Min A to sit EOB without bedrail. able to assist LLE up to bed w/ assist given for RLE. Pt initially requesting assist for LLE to roll, OT placed hand on LLE though pt then able to demo ability to lift BLE easily for rolling efforts    Transfers Overall transfer level: Needs assistance Equipment used: Sliding board Transfers: Bed to chair/wheelchair/BSC            Lateral/Scoot Transfers: Mod assist, Max assist, With slide board (+3) General transfer comment: Mod A x 3 (third person for safety) for sliding board from deflated kreg bed to bari wheelchair. assist to offload bottom on B sides w/ good effort from pt to scoot with cues for posture and sequencing. Max A  x 3 needed to slide board back to bed (slightly uphill despite deflate) with +2 assist needed to assist scooting pt bottom back to bed    Ambulation/Gait               General Gait Details: unable   Theme park manager mobility: Yes Wheelchair propulsion: Both upper extremities, Both lower extermities Wheelchair parts: Needs assistance Distance: 40 Wheelchair Assistance Details (indicate cue type and  reason): Needed assist to navigate through doors and tight spaces as chair is wide  Modified Rankin (Stroke Patients Only)       Balance Overall balance assessment: Needs assistance Sitting-balance support: Feet supported, No upper extremity  supported Sitting balance-[name]: Fair Sitting balance - Comments: Able to sit EOB without UE support                                    Cognition Arousal/Alertness: Awake/alert Behavior During Therapy: WFL for tasks assessed/performed Overall Cognitive Status: Within Functional Limits for tasks assessed                                 General Comments: hyperverbose, decreased insight into deficits        Exercises      General Comments General comments (skin integrity, edema, etc.): SW, RNs present to observe and assist in zeroing bed for pt weight.  Provided shower cap at end of treatment for pt to wash hair as she stated she hasnt washed hair in months.      Pertinent Vitals/Pain Pain Assessment Pain Assessment: No/denies pain    Home Living                          Prior Function            PT Goals (current goals can now be found in the care plan section) Acute Rehab PT Goals Patient Stated Goal: to go to a new SNF for rehab then home Progress towards PT goals: Progressing toward goals    Frequency    Min 2X/week      PT Plan Current plan remains appropriate    Co-evaluation PT/OT/SLP Co-Evaluation/Treatment: Yes Reason for Co-Treatment: Complexity of the patient's impairments (multi-system involvement);For patient/therapist safety (for OOB transfer) PT goals addressed during session: Mobility/safety with mobility;Balance        AM-PAC PT "6 Clicks" Mobility   Outcome Measure  Help needed turning from your back to your side while in a flat bed without using bedrails?: A Little Help needed moving from lying on your back to sitting on the side of a flat bed without using bedrails?: A Lot Help needed moving to and from a bed to a chair (including a wheelchair)?: Total Help needed standing up from a chair using your arms (e.g., wheelchair or bedside chair)?: Total Help needed to walk in hospital room?:  Total Help needed climbing 3-5 steps with a railing? : Total 6 Click Score: 9    End of Session Equipment Utilized During Treatment: Gait belt Activity Tolerance: Patient tolerated treatment well Patient left: in bed;with call bell/phone within reach Nurse Communication: Mobility status PT Visit Diagnosis: Muscle weakness (generalized) (M62.81);Difficulty in walking, not elsewhere classified (R26.2)     Time: 1017-5102 PT Time Calculation (min) (ACUTE ONLY): 48 min  Charges:  $Therapeutic Activity: 23-37 mins                     Kanylah Muench M,PT Acute Rehab Services [phone]    Alvira Philips 01/14/2022, 1:48 PM

## 2022-01-14 NOTE — Progress Notes (Signed)
Occupational Therapy Treatment Patient Details Name: Beth Marshall MRN: [id number] DOB: [birth year] Today's Date: 01/14/2022   History of present illness Pt is a 56 y.o. female admitted from SNF rehab on 11/12/21 with back pain and x4 month and intermittent L leg pain, weakness, and numbness. Pt has been at [facility] for rehab since June secondary to chronic back pain s/p L tib fib fx (BLE WBAT). PMH includes OA, back pain, hx of DVT, edema, factor V leiden mutation, GERD, anxiety, hypokalemia, insomnia, intrinsic asthma, lipoma, migraine, morbid obesity, tachycardia.   OT comments  Focus on first transfer OOB in coordination with PT. With use of sliding board, deflated air mattress, and bariatric wheelchair, pt able to demo successful transfer OOB with Mod A x 3 (for safety). On return to bed, surface slightly uphill with pt requiring Max A x 3 for scoot/sliding board back to bed. Pt able to demo self propulsion while in wheelchair a short distance before fatiguing. Pt pleased with progress and eager to attempt again. Continue to rec SNF at DC unless family able to provide ADL assist bed level at home.   Recommendations for follow up therapy are one component of a multi-disciplinary discharge planning process, led by the attending physician.  Recommendations may be updated based on patient status, additional functional criteria and insurance authorization.    Follow Up Recommendations  Skilled nursing-short term rehab (<3 hours/day)     Assistance Recommended at Discharge Frequent or constant Supervision/Assistance  Patient can return home with the following  Two people to help with walking and/or transfers;Two people to help with bathing/dressing/bathroom   Equipment Recommendations  Hospital bed;Wheelchair cushion (measurements OT);Wheelchair (measurements OT);Other (comment) (hoyer lift)    Recommendations for Other Services      Precautions / Restrictions  Precautions Precautions: Fall Restrictions Weight Bearing Restrictions: No       Mobility Bed Mobility Overal bed mobility: Needs Assistance Bed Mobility: Supine to Sit, Sit to Supine Rolling: Modified independent (Device/Increase time), Min guard   Supine to sit: Min assist Sit to supine: Min assist   General bed mobility comments: Min A to sit EOB without bedrail. able to assist LLE up to bed w/ assist given for RLE. Pt initially requesting assist for LLE to roll, OT placed hand on LLE though pt then able to demo ability to lift BLE easily for rolling efforts    Transfers Overall transfer level: Needs assistance Equipment used: Sliding board Transfers: Bed to chair/wheelchair/BSC            Lateral/Scoot Transfers: Mod assist, Max assist, With slide board (+3) General transfer comment: Mod A x 3 (third person for safety) for sliding board from deflated kreg bed to bari wheelchair. assist to offload bottom on B sides w/ good effort from pt to scoot with cues for posture and sequencing. Max A  x 3 needed to slide board back to bed (slightly uphill despite deflate) with +2 assist needed to assist scooting pt bottom back to bed     Balance Overall balance assessment: Needs assistance Sitting-balance support: Feet supported, No upper extremity supported Sitting balance-[name]: Fair                                     ADL either performed or assessed with clinical judgement   ADL Overall ADL's : Needs assistance/impaired  Lower Body Dressing: Total assistance;Bed level Lower Body Dressing Details (indicate cue type and reason): for slippers               General ADL Comments: Focus on OOB transfer attempt with sliding board to/from wheelchair to assess functional transfer potential    Extremity/Trunk Assessment Upper Extremity Assessment Upper Extremity Assessment: Overall WFL for tasks assessed   Lower Extremity  Assessment Lower Extremity Assessment: Defer to PT evaluation        Vision   Vision Assessment?: No apparent visual deficits   Perception     Praxis      Cognition Arousal/Alertness: Awake/alert Behavior During Therapy: WFL for tasks assessed/performed Overall Cognitive Status: Within Functional Limits for tasks assessed                                 General Comments: hyperverbose, decreased insight into deficits        Exercises      Shoulder Instructions       General Comments SW, RNs present to observe and assist in zeroing bed for pt weight    Pertinent Vitals/ Pain       Pain Assessment Pain Assessment: Faces Faces Pain Scale: No hurt  Home Living                                          Prior Functioning/Environment              Frequency  Min 1X/week        Progress Toward Goals  OT Goals(current goals can now be found in the care plan section)  Progress towards OT goals: Progressing toward goals  Acute Rehab OT Goals Patient Stated Goal: get able to stand on scale, work on transfers OT Goal Formulation: With patient Time For Goal Achievement: 01/20/22 Potential to Achieve Goals: Fair ADL Goals Pt Will Transfer to Toilet: with mod assist;with +2 assist;stand pivot transfer;squat pivot transfer;bedside commode Additional ADL Goal #1: Pt to demo ability to complete bed mobility to get back into bed with Modified Independence Additional ADL Goal #2: Pt to demo ability to stand at bedside > 1 min in order to maximize saftey with LB ADL Additional ADL Goal #3: Pt to tolerate tilting > 60* for 8 min during functional tasks  Plan Discharge plan remains appropriate    Co-evaluation    PT/OT/SLP Co-Evaluation/Treatment: Yes Reason for Co-Treatment: For patient/therapist safety;To address functional/ADL transfers;Other (comment) (to attempt OOB transfer)          AM-PAC OT "6 Clicks" Daily Activity      Outcome Measure   Help from another person eating meals?: None Help from another person taking care of personal grooming?: A Little Help from another person toileting, which includes using toliet, bedpan, or urinal?: Total Help from another person bathing (including washing, rinsing, drying)?: A Lot Help from another person to put on and taking off regular upper body clothing?: A Little Help from another person to put on and taking off regular lower body clothing?: Total 6 Click Score: 14    End of Session Equipment Utilized During Treatment: Other (comment) (sliding board)  OT Visit Diagnosis: Unsteadiness on feet (R26.81);Other abnormalities of gait and mobility (R26.89);Muscle weakness (generalized) (M62.81);Pain Pain - Right/Left: Left Pain - part of body: Knee   Activity  Tolerance Patient tolerated treatment well   Patient Left in bed;with call bell/phone within reach   Nurse Communication Mobility status        Time: 1003-1040 OT Time Calculation (min): 37 min  Charges: OT General Charges $OT Visit: 1 Visit OT Treatments $Therapeutic Activity: 8-22 mins  Malachy Chamber, OTR/L Acute Rehab Services Office: [phone]   Layla Maw 01/14/2022, 12:01 PM

## 2022-01-14 NOTE — ED Provider Notes (Signed)
Emergency Medicine Observation Re-evaluation Note  CAITRIONA SUNDQUIST is a 56 y.o. female, seen on rounds today.  Pt initially presented to the ED for complaints of Back Pain Currently, the patient is resting quietly.  Physical Exam  BP 124/62 (BP Location: Right Arm)   Pulse 77   Temp (!) 97.5 F (36.4 C) (Oral)   Resp 18   Ht '5\' 6"'$  (1.676 m)   Wt (!) 194.1 kg   SpO2 99%   BMI 69.07 kg/m  Physical Exam General: No acute distress Cardiac: Well-perfused Lungs: Nonlabored Psych: Calm  ED Course / MDM  EKG:   I have reviewed the labs performed to date as well as medications administered while in observation.  Recent changes in the last 24 hours include social work working on placement.  Continues to work with physical therapy  Plan  Current plan is for placement.  Unfortunately it sounds like she is not a candidate for many places due to her weight.    Hayden Rasmussen, MD 01/14/22 1030

## 2022-01-15 DIAGNOSIS — M545 Low back pain, unspecified: Secondary | ICD-10-CM | POA: Diagnosis not present

## 2022-01-15 MED ORDER — SUMATRIPTAN SUCCINATE 100 MG PO TABS
100.0000 mg | ORAL_TABLET | Freq: Once | ORAL | Status: AC
  Administered 2022-01-15: 100 mg via ORAL
  Filled 2022-01-15: qty 1

## 2022-01-15 NOTE — Progress Notes (Signed)
Physical Therapy Treatment Patient Details Name: Beth Marshall MRN: [id number] DOB: 07/10/1965 Today's Date: 01/15/2022   History of Present Illness Pt is a 56 y.o. female admitted from SNF rehab on 11/12/21 with back pain and x4 month and intermittent L leg pain, weakness, and numbness. Pt has been at [facility] for rehab since June secondary to chronic back pain s/p L tib fib fx (BLE WBAT). PMH includes OA, back pain, hx of DVT, edema, factor V leiden mutation, GERD, anxiety, hypokalemia, insomnia, intrinsic asthma, lipoma, migraine, morbid obesity, tachycardia.    PT Comments    Pt admitted with above diagnosis. Pt is still progressing with pt standing with RW 15 seconds x 2 today. Goal is to get pt to where she can transfer with Modif I with RW vs with 1 person assist.  Will continue to progress pt as able.  Pt currently with functional limitations due to balance and endurance deficits. Pt will benefit from skilled PT to increase their independence and safety with mobility to allow discharge to the venue listed below.      Recommendations for follow up therapy are one component of a multi-disciplinary discharge planning process, led by the attending physician.  Recommendations may be updated based on patient status, additional functional criteria and insurance authorization.  Follow Up Recommendations  Skilled nursing-short term rehab (<3 hours/day) Can patient physically be transported by private vehicle: No   Assistance Recommended at Discharge Frequent or constant Supervision/Assistance  Patient can return home with the following Two people to help with walking and/or transfers;A lot of help with bathing/dressing/bathroom;Assistance with cooking/housework;Assist for transportation;Help with stairs or ramp for entrance   Equipment Recommendations  Other (comment) (defer to SNF)    Recommendations for Other Services OT consult     Precautions / Restrictions  Precautions Precautions: Fall Precaution Comments: bladder/bowel incontinence Restrictions Weight Bearing Restrictions: No LLE Weight Bearing: Weight bearing as tolerated Other Position/Activity Restrictions: confirmed WBAT BLE per Dr. Nechama Guard 11/14/21     Mobility  Bed Mobility Overal bed mobility: Needs Assistance Bed Mobility: Supine to Sit, Sit to Supine Rolling: Modified independent (Device/Increase time), Min guard Sidelying to sit: Independent Supine to sit: Min guard (used of rails) Sit to supine: Min assist (for left LE)   General bed mobility comments: Min guard A to sit EOB with bedrail. PT had to assist LLE up to bed w/ less assist given for RLE when lying back down.    Transfers Overall transfer level: Needs assistance     Sit to Stand: Mod assist, +2 physical assistance, +2 safety/equipment, Min assist, From elevated surface           General transfer comment: Mod to min  A +2 to stand on scale for weighing pt this am.  Nurse informed of pts weight at 423 lbs.  Bed zeroed while up and bed weight 426 lbs.  Slight discrepancy between scales and nurse aware.Pt was able to stand x 2 for up to 15 seconds each time to RW and once up was min guard assist.  Pt standing close to fully upright as well.    Ambulation/Gait               General Gait Details: unable   Stairs             Wheelchair Mobility    Modified Rankin (Stroke Patients Only)       Balance Overall balance assessment: Needs assistance Sitting-balance support: Feet supported, No upper extremity supported  Sitting balance-[name]: Fair Sitting balance - Comments: Able to sit EOB without UE support   Standing balance support: Bilateral upper extremity supported, During functional activity Standing balance-[name]: Poor Standing balance comment: Pt stood 15 seconds x 2 to RW with min guard assist of 2 once in standing.                            Cognition  Arousal/Alertness: Awake/alert Behavior During Therapy: WFL for tasks assessed/performed Overall Cognitive Status: Within Functional Limits for tasks assessed                                 General Comments: hyperverbose, decreased insight into deficits        Exercises      General Comments        Pertinent Vitals/Pain Pain Assessment Pain Assessment: No/denies pain    Home Living                          Prior Function            PT Goals (current goals can now be found in the care plan section) Acute Rehab PT Goals Patient Stated Goal: to go to a new SNF for rehab then home Progress towards PT goals: Progressing toward goals    Frequency    Min 2X/week      PT Plan Current plan remains appropriate    Co-evaluation              AM-PAC PT "6 Clicks" Mobility   Outcome Measure  Help needed turning from your back to your side while in a flat bed without using bedrails?: A Little Help needed moving from lying on your back to sitting on the side of a flat bed without using bedrails?: A Lot Help needed moving to and from a bed to a chair (including a wheelchair)?: Total Help needed standing up from a chair using your arms (e.g., wheelchair or bedside chair)?: Total Help needed to walk in hospital room?: Total Help needed climbing 3-5 steps with a railing? : Total 6 Click Score: 9    End of Session Equipment Utilized During Treatment: Gait belt Activity Tolerance: Patient tolerated treatment well Patient left: in bed;with call bell/phone within reach Nurse Communication: Mobility status PT Visit Diagnosis: Muscle weakness (generalized) (M62.81);Difficulty in walking, not elsewhere classified (R26.2)     Time: 3491-7915 PT Time Calculation (min) (ACUTE ONLY): 32 min  Charges:  $Therapeutic Activity: 23-37 mins                     Octavis Sheeler M,PT Acute Rehab Services [phone]    Alvira Philips 01/15/2022, 1:26 PM

## 2022-01-16 DIAGNOSIS — M545 Low back pain, unspecified: Secondary | ICD-10-CM | POA: Diagnosis not present

## 2022-01-16 MED ORDER — CLOTRIMAZOLE 2 % VA CREA
1.0000 | TOPICAL_CREAM | Freq: Every day | VAGINAL | Status: AC
  Administered 2022-01-16 – 2022-01-18 (×2): 1 via VAGINAL
  Filled 2022-01-16: qty 21

## 2022-01-16 NOTE — ED Notes (Signed)
Patient had BM on self. Cleaned patient, changed hospital linens and changed pure wick. Patient clean and dry at this time

## 2022-01-16 NOTE — ED Provider Notes (Signed)
Emergency Medicine Observation Re-evaluation Note  Beth Marshall is a 56 y.o. female, seen on rounds today.  Pt initially presented to the ED for complaints of chronic back pain and housing instability. No new c/o this AM.   Physical Exam  BP 113/87   Pulse 70   Temp 98.1 F (36.7 C) (Oral)   Resp 18   Ht 1.676 m ('5\' 6"'$ )   Wt (!) 193.8 kg   SpO2 100%   BMI 68.96 kg/m  Physical Exam General: alert, content, nad.  Cardiac: regular rate. Lungs: breathing comfortably.   ED Course / MDM    I have reviewed the labs performed to date as well as medications administered while in observation.  Recent changes in the last 24 hours include ED obs, reassessment.   Plan  TOC team has not been able to secure ECF placement for patient.   It appears pts best longer term option is to transition back to home, with family and home health/home care extender services (while continuing to work on improving nutrition and physical conditioning).  Plan is for [facility] meeting with pt/family.      Lajean Saver, MD 01/16/22 [phone]

## 2022-01-16 NOTE — ED Notes (Signed)
Changed and sister at the bedside

## 2022-01-17 DIAGNOSIS — M545 Low back pain, unspecified: Secondary | ICD-10-CM | POA: Diagnosis not present

## 2022-01-17 NOTE — ED Provider Notes (Signed)
Emergency Medicine Observation Re-evaluation Note  Beth Marshall is a 56 y.o. female, seen on rounds today.  Pt initially presented to the ED for complaints of chronic back pain, and physical deconditioning/weakness complicated by class III obesity. Pt getting PT, nutritional team has been consulted,  and TOC team has been working on placement.   Physical Exam  BP (!) 115/52   Pulse 63   Temp 97.8 F (36.6 C)   Resp 16   Ht 1.676 m ('5\' 6"'$ )   Wt (!) 193.8 kg   SpO2 100%   BMI 68.96 kg/m  Physical Exam General: alert, no distress. Cardiac: regular rate. Lungs: breathing comfortably.   ED Course / MDM    I have reviewed the labs performed to date as well as medications administered while in observation.  Recent changes in the last 24 hours include ED obs, med management, PT, nutrition, and TOC placement.   Plan  TOC team has not been able to secure ECF placement for patient.    It appears pts best longer term option is to transition back to home, with family and home health/home care extender services (while continuing to work on improving nutrition and physical conditioning).   Plan is for [facility] meeting with pt/family.   Lajean Saver, MD 01/17/22 [phone]

## 2022-01-18 DIAGNOSIS — M549 Dorsalgia, unspecified: Secondary | ICD-10-CM | POA: Diagnosis present

## 2022-01-18 DIAGNOSIS — G4733 Obstructive sleep apnea (adult) (pediatric): Secondary | ICD-10-CM | POA: Diagnosis not present

## 2022-01-18 DIAGNOSIS — G8929 Other chronic pain: Secondary | ICD-10-CM | POA: Diagnosis not present

## 2022-01-18 DIAGNOSIS — Z23 Encounter for immunization: Secondary | ICD-10-CM | POA: Diagnosis not present

## 2022-01-18 DIAGNOSIS — M79605 Pain in left leg: Secondary | ICD-10-CM | POA: Diagnosis not present

## 2022-01-18 DIAGNOSIS — Z1152 Encounter for screening for COVID-19: Secondary | ICD-10-CM | POA: Diagnosis not present

## 2022-01-18 DIAGNOSIS — M79604 Pain in right leg: Secondary | ICD-10-CM | POA: Diagnosis not present

## 2022-01-18 DIAGNOSIS — Z7901 Long term (current) use of anticoagulants: Secondary | ICD-10-CM | POA: Diagnosis not present

## 2022-01-18 DIAGNOSIS — R600 Localized edema: Secondary | ICD-10-CM | POA: Diagnosis not present

## 2022-01-18 DIAGNOSIS — M545 Low back pain, unspecified: Secondary | ICD-10-CM | POA: Diagnosis not present

## 2022-01-18 DIAGNOSIS — R0602 Shortness of breath: Secondary | ICD-10-CM | POA: Diagnosis not present

## 2022-01-18 NOTE — ED Notes (Signed)
Patient resting supine in bed. Chest rise and fall seen. No signs of distress noted.

## 2022-01-18 NOTE — ED Notes (Signed)
Patient cleaned up and changed into clean chux and fresh linen.

## 2022-01-19 DIAGNOSIS — Z86718 Personal history of other venous thrombosis and embolism: Secondary | ICD-10-CM | POA: Diagnosis not present

## 2022-01-19 DIAGNOSIS — D6851 Activated protein C resistance: Secondary | ICD-10-CM | POA: Diagnosis not present

## 2022-01-19 DIAGNOSIS — Z7901 Long term (current) use of anticoagulants: Secondary | ICD-10-CM

## 2022-01-19 DIAGNOSIS — I480 Paroxysmal atrial fibrillation: Secondary | ICD-10-CM

## 2022-01-19 DIAGNOSIS — G8929 Other chronic pain: Secondary | ICD-10-CM

## 2022-01-19 DIAGNOSIS — M549 Dorsalgia, unspecified: Secondary | ICD-10-CM | POA: Diagnosis not present

## 2022-01-19 DIAGNOSIS — M545 Low back pain, unspecified: Secondary | ICD-10-CM | POA: Diagnosis not present

## 2022-01-19 DIAGNOSIS — G4733 Obstructive sleep apnea (adult) (pediatric): Secondary | ICD-10-CM

## 2022-01-19 LAB — PROTIME-INR
INR: 2.5 — ABNORMAL HIGH (ref 0.8–1.2)
Prothrombin Time: 27 seconds — ABNORMAL HIGH (ref 11.4–15.2)

## 2022-01-19 NOTE — ED Notes (Signed)
Patient's bed changed into new chux. Updated vitals. Patient inquired about pain meds and PT. RN notified. Patient is comfortable at this time.

## 2022-01-19 NOTE — Progress Notes (Signed)
CSW has a meeting scheduled today to discuss patients plan of care with leadership. Patient at this time is over the weight limit for SNF placement.

## 2022-01-19 NOTE — ED Provider Notes (Signed)
Emergency Medicine Observation Re-evaluation Note  Beth Marshall is a 57 y.o. female, seen on rounds today.  Pt initially presented to the ED for complaints of Back Pain Patient w morbid obesity and related impaired mobility and chronic physical deconditioning. Pt awaiting ECF placement. .  Physical Exam  BP (!) 144/74 (BP Location: Right Arm)   Pulse 67   Temp 98.5 F (36.9 C) (Oral)   Resp 20   Ht '5\' 6"'$  (1.676 m)   Wt (!) 193.8 kg   SpO2 100%   BMI 68.96 kg/m  Physical Exam General: Talking on phone, no distress Cardiac: Well-perfused Lungs: Clear lungs Psych: Calm  ED Course / MDM  EKG:   I have reviewed the labs performed to date as well as medications administered while in observation.  Recent changes in the last 24 hours include physical therapy and likely ECF placement.  Plan  Current plan is for physical therapy and likely ECF placement.    Ezequiel Essex, MD 01/19/22 [phone]

## 2022-01-19 NOTE — Consult Note (Addendum)
Initial Consultation Note   Patient: Beth Marshall TIR:[id number] DOB: [birth year] PCP: Madison Hickman, FNP DOA: 11/13/2021 DOS: the patient was seen and examined on 01/19/2022 Primary service: Default, Provider, MD  Referring physician: Rancour, MD Reason for consult: medical management   Assessment/Plan: Beth Marshall is a 57 year old female with chief complaint of back pain and bilateral leg pain who came from rehab on 10/26 with complaints of back pain and bilateral leg pain who has been bedbound unable to walk since June.  Patient presented back to the emergency department on 10/27 and did not want to go back to the rehabilitation center that she came from and therefore has been awaiting disposition.  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  Patient reports having continued lower back pain with radiation down the left leg more so than the right.  Previous imaging had noted significant multilevel degenerative disc disease with facet arthropathy resulting in significant neuroforaminal stenosis at L4 -L5 and L5-S1 that was noted worse on the left consistent with patient's symptoms.  Further imaging with MRI unable to be obtained at this time due to a capacity machine.  Patient reports adequate relief with current pain regimen. -Continue current pain regimen oxycodone, cyclobenzaprine prn, and Voltaren gel -Continue physical therapy  Yeast infection Acute.  Patient reports improvement in symptoms with current treatment. -Continue Lotrimin cream  History of DVT on chronic anticoagulation Factor V Leiden deficiency Protein S deficiency INR currently stable at 2.5. -Continue Coumadin per pharmacy  Normocytic anemia Hemoglobin previously noted to be 10.7 with elevated RDW of 16.2 on 12/22. -Recheck CBC  Paroxysmal atrial fibrillation on chronic anticoagulation -Not on  Asthma, without acute exacerbation -ContinueMontelukast 10  mg p.o. daily, Symbicort 2 puffs daily, albuterol MDI as needed for shortness of breath/wheezing   Anxiety and depression -Continue venlafaxine  GERD -Continue Protonix  OSA Patient has been intolerant with CPAP.  Previously recommended to be on 2 L nasal cannula oxygen at night. -Orders placed to have patient 2 L of nasal cannula oxygen at night  Morbid obesity BMI greater than 60 Debility BMI 68.96 kg/m.  Patient unable to ambulate or care for self without assistance which placement is needed. -Continue TOC looking for placement  TRH will sign off at present, please call us again when needed.  HPI: Beth Marshall is a 57 y.o. female with past medical paroxysmal atrial fibrillation, history of DVT due to factor V Leiden along with protein S deficiency on chronic anticoagulation,  severe morbid obesity, and debility who presented with complaints of back pain and leg pain on 10/26 to the ED.  At that time she had a CT scan of the lumbar spine back on 11/12/2021 which noted multilevel degenerative disc disease with facet arthropathy resulting in significant bilateral neuroforaminal stenosis at the L4-L5 and L5-S1 left worse than right   Patient had been discharged to [facility] facility, but left due to the care that she was given there.  It appears patient has been here in the ED since that time awaiting disposition.  She reports her back pain is relatively unchanged.  She has lower back pain that radiates down the left leg more so than the right that she describes as sharp and shooting at times.  Notes associated symptoms of numbness in the left leg with physical therapy as well as some shortness of breath when doing activities.  She intermittently gets migraines and causes some nausea but she  is she has been using oxycodone for pain with adequate relief.  She denies any fever, but chronically has chills related to being on Coumadin.  Recently patient has been dealing with some vaginal  discomfort and was diagnosed with a yeast infection, but states that she has been on Lotrimin cream and has improved.     Review of Systems: As mentioned in the history of present illness. All other systems reviewed and are negative. Past Medical History:  Diagnosis Date   Abnormal coagulation profile 02/18/2015   Arthritis of right knee 12/20/2016   Back pain 02/18/2015   Blood clot in vein    Calculus of gallbladder with cholecystitis and obstruction 06/23/2014   Chronic deep vein thrombosis (DVT) of lower extremity ([place]) 10/27/2015   Dizziness 02/18/2015   Edema of extremities 02/18/2015   Elevated blood pressure reading 03/20/2015   Encounter for screening mammogram for breast cancer 10/27/2015   Factor V Leiden mutation ([place]) 10/27/2015   Gastroesophageal reflux disease 02/18/2015   Generalized anxiety disorder 02/18/2015   High risk medication use 07/25/2015   History of DVT (deep vein thrombosis) 03/15/2018   Hypokalemia 05/16/2015   Impaired fasting glucose 02/18/2015   Insomnia 02/18/2015   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   Lesion of ovary 02/18/2015   Lipoma 02/18/2015   Long term current use of anticoagulant therapy 03/20/2015   Migraine without aura and without status migrainosus, not intractable 06/25/2015   Moderate episode of recurrent major depressive disorder ([place]) 08/02/2018   Obesity, morbid, BMI 50 or higher ([place]) 02/18/2015   OSA (obstructive sleep apnea) 02/18/2015   Osteoarthritis 02/18/2015   Primary osteoarthritis of both knees 08/30/2017   Primary osteoarthritis of left knee 04/22/2015   Protein S deficiency ([place]) 02/18/2015   Shortness of breath 02/18/2015   Tachycardia 07/01/2015   Tachycardia 07/01/2015   Past Surgical History:  Procedure Laterality Date   ABDOMINAL HYSTERECTOMY     TIBIA IM NAIL INSERTION Left 06/19/2021   Procedure: INTRAMEDULLARY (IM) NAIL TIBIAL;  Surgeon: Shona Needles, MD;  Location: [place];  Service: Orthopedics;  Laterality: Left;   Social History:  reports  that she has never smoked. She does not have any smokeless tobacco history on file. She reports that she does not drink alcohol and does not use drugs.  Allergies  Allergen Reactions   Cephalexin Other (See Comments)    Unknown reaction per MAR    Chocolate Other (See Comments)    Unknown reaction per MAR   Codeine Nausea And Vomiting   Diphenhydramine Hcl Itching   Ranitidine Rash    History reviewed. No pertinent family history.  Prior to Admission medications   Medication Sig Start Date End Date Taking? Authorizing Provider  acetaminophen (TYLENOL) 650 MG CR tablet Take 650 mg by mouth every 6 (six) hours as needed for pain.   Yes [provider]  albuterol (PROVENTIL HFA;VENTOLIN HFA) 108 (90 BASE) MCG/ACT inhaler Inhale 1 puff into the lungs every 6 (six) hours as needed for wheezing or shortness of breath.   Yes [provider]  BELSOMRA 10 MG TABS Take 10 mg by mouth daily as needed (for insomnia). 06/01/21  Yes [provider]  budesonide-formoterol (SYMBICORT) 160-4.5 MCG/ACT inhaler Inhale 2 puffs into the lungs.   Yes [provider]  calcipotriene (DOVONOX) 0.005 % cream Apply 1 Application topically 2 (two) times daily. Apply to affected areas twice a day for psoriasis   Yes [provider]  cetirizine (ZYRTEC) 10 MG tablet  Take 10 mg by mouth. 01/16/19  Yes [provider]  cyclobenzaprine (FLEXERIL) 5 MG tablet Take 5 mg by mouth 3 (three) times daily as needed for muscle spasms.   Yes [provider]  diclofenac Sodium (VOLTAREN) 1 % GEL Apply 2 g topically every 8 (eight) hours as needed (for pain).   Yes [provider]  furosemide (LASIX) 40 MG tablet Take 40 mg by mouth.   Yes [provider]  montelukast (SINGULAIR) 10 MG tablet Take 10 mg by mouth daily at 12 noon. 10/24/18  Yes [provider]  oxyCODONE-acetaminophen (PERCOCET/ROXICET) 5-325 MG tablet Take 1-2 tablets by mouth  every 4 (four) hours as needed for severe pain (1 tablet moderate pain, 2 tablets severe pain). 06/22/21  Yes McClung, Sarah A, PA-C  pantoprazole (PROTONIX) 40 MG tablet Take 1 tablet (40 mg total) by mouth daily. 06/27/21 12/26/21 Yes [place] Eric J, DO  polyethylene glycol (MIRALAX / GLYCOLAX) 17 g packet Take 17 g by mouth daily as needed for mild constipation. 06/26/21  Yes [place] Eric J, DO  Potassium Chloride 10 MEQ PACK Take 10 mEq by mouth 2 (two) times daily.   Yes [provider]  SUMAtriptan (IMITREX) 100 MG tablet Take 1 tablet by mouth every 2 (two) hours as needed for migraine. 07/18/15  Yes [provider]  venlafaxine XR (EFFEXOR-XR) 75 MG 24 hr capsule Take 75 mg by mouth daily with breakfast. 08/02/18  Yes [provider]  Vitamin D, Ergocalciferol, (DRISDOL) 1.25 MG (50000 UNIT) CAPS capsule Take 1 capsule (50,000 Units total) by mouth every 7 (seven) days. Patient taking differently: Take 50,000 Units by mouth every 7 (seven) days. Every Saturday 06/22/21  Yes McClung, Sarah A, PA-C  warfarin (COUMADIN) 1 MG tablet Take 1 mg by mouth See admin instructions. Take with 7.'5mg'$  tablet for a total dose of 8.'5mg'$  once every Sun, Mon, Wed, Fri, Sat   Yes [provider]  warfarin (COUMADIN) 4 MG tablet Take 8 mg by mouth 2 (two) times a week. Take 2 tablets by mouth once every Tuesday and Thursday   Yes [provider]  warfarin (COUMADIN) 7.5 MG tablet Take 7.5 mg by mouth See admin instructions. Take with '1mg'$  tablet for a total dose of 8.'5mg'$  once every Sun, Mon, Wed, Fri, Sat   Yes [provider]    Physical Exam: Vitals:   01/16/22 2117 01/17/22 0610 01/18/22 1115 01/19/22 0749  BP:  (!) 115/52 134/61 (!) 144/74  Pulse:  63 65 67  Resp:  '16 20 20  '$ Temp: 97.8 F (36.6 C) 97.8 F (36.6 C) 98.5 F (36.9 C)   TempSrc: Oral  Oral   SpO2:  100% 100% 100%  Weight:      Height:       Exam  Constitutional: Morbidly obese middle age  female in no acute distress Eyes: PERRL, lids and conjunctivae normal ENMT: Mucous membranes. Posterior pharynx clear of any exudate or lesions.  Neck: normal, supple. Respiratory: Decreased overall aeration with no significant wheezes appreciated.  Patient able to talk in complete sentences. Cardiovascular: Regular rate and rhythm, gallops. No lower extremity edema.  Abdomen: protuberant abdomen, no tenderness, no masses palpated. Bowel sounds positive.  Musculoskeletal: no clubbing / cyanosis.   Normal muscle tone.  Skin: Mild bruising noted of the right forearm. Neurologic: CN 2-12 grossly intact.  Strength 5/5 in all 4.  Psychiatric: Normal judgment and insight. Alert and oriented x 3. Normal mood.  Data Reviewed:      Family Communication: none  Primary team communication:   Thank you very much for involving [place] in the care of your patient.  Author: Norval Morton, MD 01/19/2022 1:30 PM  For on call review [email or web address].

## 2022-01-19 NOTE — Progress Notes (Signed)
Physical Therapy Treatment Patient Details Name: Beth Marshall MRN: [id number] DOB: 01/02/1966 Today's Date: 01/19/2022   History of Present Illness Pt is a 57 y.o. female admitted from SNF rehab on 11/12/21 with back pain and x4 month and intermittent L leg pain, weakness, and numbness. Pt has been at [facility] for rehab since June secondary to chronic back pain s/p L tib fib fx (BLE WBAT). PMH includes OA, back pain, hx of DVT, edema, factor V leiden mutation, GERD, anxiety, hypokalemia, insomnia, intrinsic asthma, lipoma, migraine, morbid obesity, tachycardia.    PT Comments    Pt admitted with above diagnosis. Pt continues to stand at EOB for up to 10-15 seconds however cannot move LEs once up to attempt pivot transfer and at times doesn't stand fully upright.  Tilted pt after she stood to RW x 2 but this wasn't tolerated well due to pain in left knee.  Will continue to progress pt as able.  Pt currently with functional limitations due to balance and endurance deficits. Pt will benefit from skilled PT to increase their independence and safety with mobility to allow discharge to the venue listed below.      Recommendations for follow up therapy are one component of a multi-disciplinary discharge planning process, led by the attending physician.  Recommendations may be updated based on patient status, additional functional criteria and insurance authorization.  Follow Up Recommendations  Skilled nursing-short term rehab (<3 hours/day) Can patient physically be transported by private vehicle: No   Assistance Recommended at Discharge Frequent or constant Supervision/Assistance  Patient can return home with the following Two people to help with walking and/or transfers;A lot of help with bathing/dressing/bathroom;Assistance with cooking/housework;Assist for transportation;Help with stairs or ramp for entrance   Equipment Recommendations  Other (comment) (defer to SNF)    Recommendations  for Other Services OT consult     Precautions / Restrictions Precautions Precautions: Fall Precaution Comments: bladder/bowel incontinence Restrictions Weight Bearing Restrictions: No LLE Weight Bearing: Weight bearing as tolerated Other Position/Activity Restrictions: confirmed WBAT BLE per Dr. Nechama Guard 11/14/21     Mobility  Bed Mobility Overal bed mobility: Needs Assistance Bed Mobility: Supine to Sit, Sit to Supine Rolling: Modified independent (Device/Increase time), Min guard   Supine to sit: Min guard (use of rails, min assist when not using rails) Sit to supine: Min assist (for left LE, tried to use gait belt without much success)   General bed mobility comments: Min guard A to sit EOB with bedrail. PT had to assist LLE up to bed w/ less assist given for RLE when lying back down. Pt tried to use gait belt to pull her leg up into bed but still needed help. Angle: 67 degrees (Max 67 degrees but didnt tolerate over 1 min and was lowered to 35 degrees rather quickly and only tolerate tilt between35-45 degrees for 3 min.) Total Minutes in Angle: 5 minutes Patient Response: Anxious  Transfers Overall transfer level: Needs assistance Equipment used: Rolling walker (2 wheels)   Sit to Stand: Mod assist, +2 physical assistance, +2 safety/equipment, Min assist, From elevated surface           General transfer comment: Pt was able to stand x 2 for up to 10 seconds each time to RW and once up was min guard assist.  Pt having difficulty standing close to fully upright.  Fatigued after 2 stands therefore laid pt back down and performed tilt as below.  Pt tried but could not pick up either  LE to weight shift.    Ambulation/Gait               General Gait Details: unable   Stairs             Wheelchair Mobility    Modified Rankin (Stroke Patients Only)       Balance Overall balance assessment: Needs assistance Sitting-balance support: Feet supported, No  upper extremity supported Sitting balance-[name]: Fair Sitting balance - Comments: Able to sit EOB without UE support   Standing balance support: Bilateral upper extremity supported, During functional activity Standing balance-[name]: Poor Standing balance comment: Pt stood 10 seconds x 2 to RW with min guard assist of 2 once in standing.                            Cognition Arousal/Alertness: Awake/alert Behavior During Therapy: WFL for tasks assessed/performed Overall Cognitive Status: Within Functional Limits for tasks assessed                                 General Comments: hyperverbose, decreased insight into deficits        Exercises General Exercises - Lower Extremity Quad Sets: AROM, Both, Standing, 5 reps Other Exercises Other Exercises: weight shifting, lifting toe/heel in tilt bed at 45 degrees    General Comments        Pertinent Vitals/Pain Pain Assessment Pain Assessment: Faces Faces Pain Scale: Hurts even more Pain Location: L knee Pain Descriptors / Indicators: Cramping Pain Intervention(s): Limited activity within patient's tolerance, Monitored during session, Repositioned, Premedicated before session    Home Living                          Prior Function            PT Goals (current goals can now be found in the care plan section) Acute Rehab PT Goals Patient Stated Goal: to go to a new SNF for rehab then home Progress towards PT goals: Progressing toward goals    Frequency    Min 2X/week      PT Plan Current plan remains appropriate    Co-evaluation              AM-PAC PT "6 Clicks" Mobility   Outcome Measure  Help needed turning from your back to your side while in a flat bed without using bedrails?: A Little Help needed moving from lying on your back to sitting on the side of a flat bed without using bedrails?: A Lot Help needed moving to and from a bed to a chair (including a  wheelchair)?: Total Help needed standing up from a chair using your arms (e.g., wheelchair or bedside chair)?: Total Help needed to walk in hospital room?: Total Help needed climbing 3-5 steps with a railing? : Total 6 Click Score: 9    End of Session Equipment Utilized During Treatment: Gait belt Activity Tolerance: Patient limited by fatigue Patient left: in bed;with call bell/phone within reach Nurse Communication: Mobility status PT Visit Diagnosis: Muscle weakness (generalized) (M62.81);Difficulty in walking, not elsewhere classified (R26.2)     Time: 9381-8299 PT Time Calculation (min) (ACUTE ONLY): 48 min  Charges:  $Therapeutic Exercise: 8-22 mins $Therapeutic Activity: 23-37 mins  [facility] M,PT Acute Rehab Services [place] 01/19/2022, 1:47 PM

## 2022-01-19 NOTE — Progress Notes (Signed)
[place] for Warfarin Indication:  paroxysmal A-fib and recurrent DVT history of factor V Leiden mutation and protein S deficiency  Allergies  Allergen Reactions   Cephalexin Other (See Comments)    Unknown reaction per MAR    Chocolate Other (See Comments)    Unknown reaction per MAR   Codeine Nausea And Vomiting   Diphenhydramine Hcl Itching   Ranitidine Rash    Patient Measurements: Height: '5\' 6"'$  (167.6 cm) Weight: (!) 193.8 kg (427 lb 4 oz) IBW/kg (Calculated) : 59.3  Vital Signs:    Labs: Recent Labs    01/19/22 0532  LABPROT 27.0*  INR 2.5*     CrCl cannot be calculated (Patient's most recent lab result is older than the maximum 21 days allowed.).  Assessment: 57 yo F with a history of factor V leiden, Afib and protein S deficiency to continue on warfarin.  Patient is morbidly obese, bedbound and has recent left tib/fib.   PTA dosing: 8.'5mg'$  daily except for '8mg'$  on Tue/Thur  INR remains therapeutic and stable on current regimen. Weekly INR - today 2.5. CBC stable on last check on 12/22.  Goal of Therapy:  INR goal 2-3 Monitor platelets by anticoagulation protocol: Yes   Plan:  Continue warfarin '10mg'$  PO daily Weekly INR, monitor s/s bleeding  Erskine Speed, PharmD Clinical Pharmacist  01/19/2022 7:19 AM  Please check AMION for all [place] phone numbers After 10:00 PM, call [place] [phone]

## 2022-01-20 DIAGNOSIS — M545 Low back pain, unspecified: Secondary | ICD-10-CM | POA: Diagnosis not present

## 2022-01-20 NOTE — Progress Notes (Signed)
Physical Therapy Treatment Patient Details Name: Beth Marshall MRN: [id number] DOB: [birth year] Today's Date: 01/20/2022   History of Present Illness Pt is a 57 y.o. female admitted from SNF rehab on 11/12/21 with back pain and x4 month and intermittent L leg pain, weakness, and numbness. Pt has been at [facility] for rehab since June secondary to chronic back pain s/p L tib fib fx (BLE WBAT). PMH includes OA, back pain, hx of DVT, edema, factor V leiden mutation, GERD, anxiety, hypokalemia, insomnia, intrinsic asthma, lipoma, migraine, morbid obesity, tachycardia.    PT Comments    Pt admitted with above diagnosis. Pt continues to make slow progress but is making progress.  Pt met 1/4 goals.  Goals revised.   Pt did enjoy propelling wheelchair on unit. Fatigues quickly but is able to propel. Pt currently with functional limitations due to balance and endurance deficits. Pt will benefit from skilled PT to increase their independence and safety with mobility to allow discharge to the venue listed below.      Recommendations for follow up therapy are one component of a multi-disciplinary discharge planning process, led by the attending physician.  Recommendations may be updated based on patient status, additional functional criteria and insurance authorization.  Follow Up Recommendations  Skilled nursing-short term rehab (<3 hours/day) Can patient physically be transported by private vehicle: No   Assistance Recommended at Discharge Frequent or constant Supervision/Assistance  Patient can return home with the following Two people to help with walking and/or transfers;A lot of help with bathing/dressing/bathroom;Assistance with cooking/housework;Assist for transportation;Help with stairs or ramp for entrance   Equipment Recommendations  Other (comment) (defer to SNF)    Recommendations for Other Services OT consult     Precautions / Restrictions Precautions Precautions:  Fall Precaution Comments: bladder/bowel incontinence Restrictions Weight Bearing Restrictions: No LLE Weight Bearing: Weight bearing as tolerated Other Position/Activity Restrictions: confirmed WBAT BLE per Dr. Nechama Guard 11/14/21     Mobility  Bed Mobility Overal bed mobility: Needs Assistance Bed Mobility: Supine to Sit, Sit to Supine Rolling: Modified independent (Device/Increase time), Min guard Sidelying to sit: Min guard Supine to sit: Min guard     General bed mobility comments: Min guard A to sit EOB without bedrail.    Transfers Overall transfer level: Needs assistance   Transfers: Bed to chair/wheelchair/BSC            Lateral/Scoot Transfers: Max assist, +2 physical assistance, From elevated surface General transfer comment: Max A x 3 (third person for safety) with use of draw sheet to help  lateral scoot  from kreg bed to [place] wheelchair. assist to offload bottom on B sides w/ good effort from pt to scoot with cues for posture and sequencing.as pt fatigues she needs incr cues for safety as she leans posteriorly and becomes SOB as well.    Ambulation/Gait               General Gait Details: unable   Theme park manager mobility: Yes Wheelchair propulsion: Both upper extremities, Both lower extermities Wheelchair parts: Needs assistance Distance: 100 Wheelchair Assistance Details (indicate cue type and reason): Needed assist to navigate through doors and tight spaces as chair is wide  Modified Rankin (Stroke Patients Only)       Balance Overall balance assessment: Needs assistance Sitting-balance support: Feet supported, No upper extremity supported Sitting balance-[name]: Fair Sitting balance - Comments: Able to  sit EOB without UE support                                    Cognition Arousal/Alertness: Awake/alert Behavior During Therapy: WFL for tasks  assessed/performed Overall Cognitive Status: Within Functional Limits for tasks assessed                                 General Comments: hyperverbose, decreased insight into deficits        Exercises      General Comments        Pertinent Vitals/Pain Pain Assessment Pain Assessment: Faces Faces Pain Scale: Hurts even more Pain Location: L knee Pain Descriptors / Indicators: Cramping Pain Intervention(s): Limited activity within patient's tolerance, Monitored during session, Repositioned, Premedicated before session    Home Living                          Prior Function            PT Goals (current goals can now be found in the care plan section) Acute Rehab PT Goals Patient Stated Goal: to go to a new SNF for rehab then home PT Goal Formulation: With patient Time For Goal Achievement: 02/03/22 Potential to Achieve Goals: Fair Progress towards PT goals: Progressing toward goals    Frequency    Min 2X/week      PT Plan Current plan remains appropriate    Co-evaluation PT/OT/SLP Co-Evaluation/Treatment: Yes Reason for Co-Treatment: Complexity of the patient's impairments (multi-system involvement);For patient/therapist safety PT goals addressed during session: Mobility/safety with mobility        AM-PAC PT "6 Clicks" Mobility   Outcome Measure  Help needed turning from your back to your side while in a flat bed without using bedrails?: A Little Help needed moving from lying on your back to sitting on the side of a flat bed without using bedrails?: A Lot Help needed moving to and from a bed to a chair (including a wheelchair)?: Total Help needed standing up from a chair using your arms (e.g., wheelchair or bedside chair)?: Total Help needed to walk in hospital room?: Total Help needed climbing 3-5 steps with a railing? : Total 6 Click Score: 9    End of Session   Activity Tolerance: Patient limited by fatigue Patient left:  with call bell/phone within reach;in chair;with family/visitor present Nurse Communication: Mobility status PT Visit Diagnosis: Muscle weakness (generalized) (M62.81);Difficulty in walking, not elsewhere classified (R26.2)     Time: 2130-8657 PT Time Calculation (min) (ACUTE ONLY): 35 min  Charges:  $Therapeutic Activity: 8-22 mins                     [facility] M,PT Acute Rehab Services [place] 01/20/2022, 1:01 PM

## 2022-01-20 NOTE — Progress Notes (Signed)
01/20/22 1300  PT Visit Information  Last PT Received On 01/20/22  Assistance Needed +3 or more (for OOB/transfers)  PT/OT/SLP Co-Evaluation/Treatment Yes  Reason for Co-Treatment Complexity of the patient's impairments (multi-system involvement)  PT goals addressed during session Mobility/safety with mobility  History of Present Illness Pt is a 57 y.o. female admitted from SNF rehab on 11/12/21 with back pain and x4 month and intermittent L leg pain, weakness, and numbness. Pt has been at [facility] for rehab since June secondary to chronic back pain s/p L tib fib fx (BLE WBAT). PMH includes OA, back pain, hx of DVT, edema, factor V leiden mutation, GERD, anxiety, hypokalemia, insomnia, intrinsic asthma, lipoma, migraine, morbid obesity, tachycardia.  Subjective Data  Patient Stated Goal to go to a new SNF for rehab then home  Precautions  Precautions Fall  Precaution Comments bladder/bowel incontinence  Restrictions  Weight Bearing Restrictions No  LLE Weight Bearing WBAT  Other Position/Activity Restrictions confirmed WBAT BLE per Dr. Nechama Guard 11/14/21  Pain Assessment  Pain Assessment Faces  Faces Pain Scale 6  Pain Location L knee  Pain Descriptors / Indicators Cramping  Pain Intervention(s) Limited activity within patient's tolerance;Monitored during session;Repositioned  Cognition  Arousal/Alertness Awake/alert  Behavior During Therapy [facility] for tasks assessed/performed  Overall Cognitive Status Within Functional Limits for tasks assessed  General Comments hyperverbose, decreased insight into deficits  Bed Mobility  Overal bed mobility Needs Assistance  Rolling Modified independent (Device/Increase time);Min guard  General bed mobility comments Pt able to roll to fix sheets and pad after Tenor used to get her back to bed  Transfers  Overall transfer level Needs assistance  Transfers Bed to chair/wheelchair/BSC  Bed to/from chair/wheelchair/BSC transfer type: Via Lift  equipment   Lateral/Scoot Transfers Total assist;+2 physical assistance  Transfer via [place] transfer comment Total assist to use lift to get pt back to bed. Had issues with the battery on the Tenor but was able to get pt back in bed.  Ambulation/Gait  General Gait Details unable  PT - End of Session  Activity Tolerance Patient limited by fatigue  Patient left with call bell/phone within reach;with family/visitor present;in bed  Nurse Communication Mobility status;Need for lift equipment   PT - Assessment/Plan  PT Plan Current plan remains appropriate  PT Visit Diagnosis Muscle weakness (generalized) (M62.81);Difficulty in walking, not elsewhere classified (R26.2)  PT Frequency (ACUTE ONLY) Min 2X/week  Recommendations for Other Services OT consult  Follow Up Recommendations Skilled nursing-short term rehab (<3 hours/day)  Can patient physically be transported by private vehicle No  Assistance recommended at discharge Frequent or constant Supervision/Assistance  Patient can return home with the following Two people to help with walking and/or transfers;A lot of help with bathing/dressing/bathroom;Assistance with cooking/housework;Assist for transportation;Help with stairs or ramp for entrance  PT equipment Other (comment) (defer to SNF)  AM-PAC PT "6 Clicks" Mobility Outcome Measure (Version 2)  Help needed turning from your back to your side while in a flat bed without using bedrails? 3  Help needed moving from lying on your back to sitting on the side of a flat bed without using bedrails? 2  Help needed moving to and from a bed to a chair (including a wheelchair)? 1  Help needed standing up from a chair using your arms (e.g., wheelchair or bedside chair)? 1  Help needed to walk in hospital room? 1  Help needed climbing 3-5 steps with a railing?  1  6 Click Score  9  Consider Recommendation of Discharge To: CIR/SNF/LTACH  Progressive Mobility  What is the  highest level of mobility based on the progressive mobility assessment? Level 2 (Chairfast) - Balance while sitting on edge of bed and cannot stand  Activity Transferred from chair to bed  PT Goal Progression  Progress towards PT goals Progressing toward goals  Acute Rehab PT Goals  PT Goal Formulation With patient  Time For Goal Achievement 02/03/22  Potential to Achieve Goals Fair  PT Time Calculation  PT Start Time (ACUTE ONLY) 1130  PT Stop Time (ACUTE ONLY) 1154  PT Time Calculation (min) (ACUTE ONLY) 24 min  PT General Charges  $$ ACUTE PT VISIT 1 Visit  PT Treatments  $Therapeutic Activity 8-22 mins  Deemed it unsafe to transfer back to bed without sliding board therefore used Tenor lift to get pt back to bed.  Tenor chargers had some issues but was able to get enough battery to get pt back in bed.  Pt appreciative. Will continue to follow acutely.  Veronia Laprise M,PT Acute Rehab Services [phone]

## 2022-01-20 NOTE — Progress Notes (Signed)
Patient is being reviewed at [place]. CSW awaiting a response

## 2022-01-20 NOTE — Progress Notes (Addendum)
Occupational Therapy Treatment Patient Details Name: Beth Marshall MRN: [id number] DOB: June 24, 1965 Today's Date: 01/20/2022   History of present illness Pt is a 57 y.o. female admitted from SNF rehab on 11/12/21 with back pain and x4 month and intermittent L leg pain, weakness, and numbness. Pt has been at [facility] for rehab since June secondary to chronic back pain s/p L tib fib fx (BLE WBAT). PMH includes OA, back pain, hx of DVT, edema, factor V leiden mutation, GERD, anxiety, hypokalemia, insomnia, intrinsic asthma, lipoma, migraine, morbid obesity, tachycardia.   OT comments  Pt seen today with PT to work on lateral transfers from bed to W/C--needs +3 Max A (with 3rd person for safety) and use of draw sheet under her to A with lateral scoot. She is able to roll in bed and come up to sit EOB with min guard A and increased time. She fatigues about 1/2 way through lateral transfer requiring increased A and VCs for safety with positioning. She will continue to benefit from Acute OT with follow up at SNF. Goals updated today.   Recommendations for follow up therapy are one component of a multi-disciplinary discharge planning process, led by the attending physician.  Recommendations may be updated based on patient status, additional functional criteria and insurance authorization.    Follow Up Recommendations  Skilled nursing-short term rehab (<3 hours/day)     Assistance Recommended at Discharge Frequent or constant Supervision/Assistance  Patient can return home with the following  Two people to help with walking and/or transfers;Two people to help with bathing/dressing/bathroom   Equipment Recommendations  Hospital bed;Wheelchair cushion (measurements OT);Wheelchair (measurements OT);Other (comment) (hoyer lift)       Precautions / Restrictions Precautions Precautions: Fall Precaution Comments: bladder/bowel incontinence Restrictions Weight Bearing Restrictions: No LLE Weight  Bearing: Weight bearing as tolerated Other Position/Activity Restrictions: confirmed WBAT BLE per Dr. Nechama Guard 11/14/21       Mobility Bed Mobility Overal bed mobility: Needs Assistance Bed Mobility: Supine to Sit, Rolling Rolling: Min guard (without rail)   Supine to sit: Min guard     General bed mobility comments: increased time and effort    Transfers Overall transfer level: Needs assistance   Transfers: Bed to chair/wheelchair/BSC            Lateral/Scoot Transfers: Max assist (+3) General transfer comment: Max A x 3 (third person for safety) with use of draw sheet to help  lateral scoot  from kreg bed to bari wheelchair. assist to offload bottom on B sides w/ good effort from pt to scoot with cues for posture and sequencing.as pt fatigues she needs incr cues for safety as she leans posteriorly and becomes SOB as well.     Balance Overall balance assessment: Needs assistance Sitting-balance support: Feet supported, No upper extremity supported Sitting balance-[name]: Good Sitting balance - Comments: Able to sit EOB without UE support                                   ADL either performed or assessed with clinical judgement   ADL Overall ADL's : Needs assistance/impaired                         Toilet Transfer: Maximal assistance Toilet Transfer Details (indicate cue type and reason): Max A +3 with 3rd person for safety, simulated lateral scoot from [place] bed> bari wheelchair using draw  sheet under her to A. Assist to offload bottom on B sides w/ good effort from pt to scoot with cues for posture and sequencing. As pt fatigues she needs incr cues for safety as she leans posteriorly and becomes SOB as well.                Extremity/Trunk Assessment Upper Extremity Assessment Upper Extremity Assessment: Generalized weakness            Vision Baseline Vision/History: 0 No visual deficits Vision Assessment?: No apparent visual  deficits          Cognition Arousal/Alertness: Awake/alert Behavior During Therapy: WFL for tasks assessed/performed Overall Cognitive Status: Within Functional Limits for tasks assessed                                 General Comments: hyperverbose, decreased insight into deficits                   Pertinent Vitals/ Pain       Pain Assessment Pain Assessment: Faces Faces Pain Scale: Hurts even more Pain Location: L knee Pain Descriptors / Indicators: Cramping Pain Intervention(s): Limited activity within patient's tolerance, Monitored during session, Repositioned         Frequency  Min 1X/week        Progress Toward Goals  OT Goals(current goals can now be found in the care plan section)  Progress towards OT goals: Progressing toward goals  Acute Rehab OT Goals Patient Stated Goal: to be able to stand on scale to get weighed OT Goal Formulation: With patient Time For Goal Achievement: 02/03/22 Potential to Achieve Goals: [place] Discharge plan remains appropriate    Co-evaluation    PT/OT/SLP Co-Evaluation/Treatment: Yes Reason for Co-Treatment: Complexity of the patient's impairments (multi-system involvement);For patient/therapist safety;To address functional/ADL transfers PT goals addressed during session: Mobility/safety with mobility OT goals addressed during session: ADL's and self-care;Strengthening/ROM      AM-PAC OT "6 Clicks" Daily Activity     Outcome Measure   Help from another person eating meals?: None Help from another person taking care of personal grooming?: A Little Help from another person toileting, which includes using toliet, bedpan, or urinal?: Total Help from another person bathing (including washing, rinsing, drying)?: A Lot Help from another person to put on and taking off regular upper body clothing?: Total Help from another person to put on and taking off regular lower body clothing?: Total 6 Click Score:  12    End of Session    OT Visit Diagnosis: Other abnormalities of gait and mobility (R26.89);Muscle weakness (generalized) (M62.81);Pain Pain - Right/Left: Left Pain - part of body: Knee   Activity Tolerance Patient tolerated treatment well   Patient Left In W/C getting ready to propel herself around the unit.   Nurse Communication Mobility status        Time: 5681-2751 OT Time Calculation (min): 35 min  Charges: OT General Charges $OT Visit: 1 Visit OT Treatments $Therapeutic Activity: 8-22 mins  Golden Circle, OTR/L [facility] Re[facility] Services [facility] [phone] Office [phone]    Almon Register 01/20/2022, 6:05 PM

## 2022-01-20 NOTE — ED Provider Notes (Signed)
Emergency Medicine Observation Re-evaluation Note  Beth Marshall is a 57 y.o. female, seen on rounds today.  Pt initially presented to the ED for complaints of Back Pain Currently, the patient is awaiting placement.  Physical Exam  BP 138/70 (BP Location: Right Arm)   Pulse 68   Temp 98.1 F (36.7 C) (Oral)   Resp 18   Ht '5\' 6"'$  (1.676 m)   Wt (!) 193.8 kg   SpO2 100%   BMI 68.96 kg/m  Physical Exam General: sitting in bed, NAD Cardiac: normal rate Lungs: no increased WOB Psych: calm  ED Course / MDM  EKG:   I have reviewed the labs performed to date as well as medications administered while in observation.  Recent changes in the last 24 hours include none.  Plan  Current plan is for social work team working on disposition, continues to get physical therapy.    Malvin Johns, MD 01/20/22 [phone]

## 2022-01-20 NOTE — ED Notes (Signed)
Changed bed pads and cleaned pt.

## 2022-01-20 NOTE — Progress Notes (Signed)
Occupational Therapy Treatment Patient Details Name: Beth Marshall MRN: [id number] DOB: [birth year] Today's Date: 01/20/2022   History of present illness Pt is a 57 y.o. female admitted from SNF rehab on 11/12/21 with back pain and x4 month and intermittent L leg pain, weakness, and numbness. Pt has been at [facility] for rehab since June secondary to chronic back pain s/p L tib fib fx (BLE WBAT). PMH includes OA, back pain, hx of DVT, edema, factor V leiden mutation, GERD, anxiety, hypokalemia, insomnia, intrinsic asthma, lipoma, migraine, morbid obesity, tachycardia.   OT comments  This 57 yo female seen again with PT to help get her back to bed via Tenor lift due to uphill from wheelchair to [place] bed and no transfer board could be found. We had trouble with battery of tenor but with increased effort and time we were able to get her safely from wheelchair to bed via Tenor lift. Skin checked for any sores or issues from straps of tenor sling--none found. Pt then was able to roll left and right in bed with min guard A to get sheets and pads adjusted under her. Pt will continue to benefit from acute OT with follow up at SNF.   Recommendations for follow up therapy are one component of a multi-disciplinary discharge planning process, led by the attending physician.  Recommendations may be updated based on patient status, additional functional criteria and insurance authorization.    Follow Up Recommendations  Skilled nursing-short term rehab (<3 hours/day)     Assistance Recommended at Discharge Frequent or constant Supervision/Assistance  Patient can return home with the following  Two people to help with walking and/or transfers;Two people to help with bathing/dressing/bathroom   Equipment Recommendations  Hospital bed;Wheelchair cushion (measurements OT);Wheelchair (measurements OT);Other (comment)       Precautions / Restrictions Precautions Precautions: Fall Precaution Comments:  bladder/bowel incontinence Restrictions Weight Bearing Restrictions: No LLE Weight Bearing: Weight bearing as tolerated Other Position/Activity Restrictions: confirmed WBAT BLE per Dr. Nechama Guard 11/14/21       Mobility Bed Mobility Overal bed mobility: Needs Assistance Bed Mobility: Rolling Rolling: Min guard        General bed mobility comments: increased time and effort; pt able to roll to fix sheets and pad after Tenor used to get her back to bed    Transfers Overall transfer level: Needs assistance   Transfers: Bed to chair/wheelchair/BSC            General transfer comment: Total assist to use lift to get pt back to bed. Had issues with the battery on the Tenor but was able to get pt back in bed. Transfer via Geologist, engineering Extremity Assessment Upper Extremity Assessment: Generalized weakness            Vision Baseline Vision/History: 0 No visual deficits Vision Assessment?: No apparent visual deficits          Cognition Arousal/Alertness: Awake/alert Behavior During Therapy: WFL for tasks assessed/performed Overall Cognitive Status: Within Functional Limits for tasks assessed                                 General Comments: hyperverbose, decreased insight into deficits                   Pertinent Vitals/ Pain       Pain Assessment Pain Assessment: Faces  Faces Pain Scale: Hurts even more Pain Location: L knee Pain Descriptors / Indicators: Cramping Pain Intervention(s): Limited activity within patient's tolerance, Monitored during session, Repositioned         Frequency  Min 1X/week        Progress Toward Goals  OT Goals(current goals can now be found in the care plan section)  Progress towards OT goals: Progressing toward goals  Acute Rehab OT Goals Patient Stated Goal: to be able to stand on scale to get weighed OT Goal Formulation: With patient Time For  Goal Achievement: 02/03/22 Potential to Achieve Goals: [place] Discharge plan remains appropriate    Co-evaluation    PT/OT/SLP Co-Evaluation/Treatment: Yes Reason for Co-Treatment: Complexity of the patient's impairments (multi-system involvement);For patient/therapist safety PT goals addressed during session: Mobility/safety with mobility OT goals addressed during session: Strengthening/ROM      AM-PAC OT "6 Clicks" Daily Activity     Outcome Measure   Help from another person eating meals?: None Help from another person taking care of personal grooming?: A Little Help from another person toileting, which includes using toliet, bedpan, or urinal?: Total Help from another person bathing (including washing, rinsing, drying)?: A Lot Help from another person to put on and taking off regular upper body clothing?: Total Help from another person to put on and taking off regular lower body clothing?: Total 6 Click Score: 11    End of Session Equipment Utilized During Treatment:  (tenor)  OT Visit Diagnosis: Other abnormalities of gait and mobility (R26.89);Muscle weakness (generalized) (M62.81);Pain Pain - Right/Left: Left Pain - part of body: Knee   Activity Tolerance Patient tolerated treatment well   Patient Left in bed;with call bell/phone within reach;with family/visitor present   Nurse Communication Mobility status        Time: 7564-3329 OT Time Calculation (min): 24 min  Charges: OT General Charges $OT Visit: 1 Visit OT Treatments $Therapeutic Activity: 8-22 mins  Golden Circle, OTR/L Acute Rehab Services [facility] [phone] Office [phone]    Almon Register 01/20/2022, 6:13 PM

## 2022-01-21 DIAGNOSIS — M545 Low back pain, unspecified: Secondary | ICD-10-CM | POA: Diagnosis not present

## 2022-01-21 LAB — CBC
HCT: 37.7 % (ref 36.0–46.0)
Hemoglobin: 11.7 g/dL — ABNORMAL LOW (ref 12.0–15.0)
MCH: 27.7 pg (ref 26.0–34.0)
MCHC: 31 g/dL (ref 30.0–36.0)
MCV: 89.3 fL (ref 80.0–100.0)
Platelets: 222 10*3/uL (ref 150–400)
RBC: 4.22 MIL/uL (ref 3.87–5.11)
RDW: 15.7 % — ABNORMAL HIGH (ref 11.5–15.5)
WBC: 10.1 10*3/uL (ref 4.0–10.5)
nRBC: 0 % (ref 0.0–0.2)

## 2022-01-21 MED ORDER — ALPRAZOLAM 0.25 MG PO TABS
0.5000 mg | ORAL_TABLET | Freq: Once | ORAL | Status: AC
  Administered 2022-01-21: 0.5 mg via ORAL
  Filled 2022-01-21: qty 2

## 2022-01-21 NOTE — ED Notes (Signed)
Pt was cleaned and changed after bowel movement. Pt given new purewick and pitcher of water. Currently resting comfortably with no complaints or issues

## 2022-01-21 NOTE — ED Notes (Signed)
Patient called out to this RN crying stating that I needed to come into the room. When this RN went into the room patient was in a very emotional state and verbalized that he step mother had just passed away. This RN offered a therapeutic presence to patient for comfort. Patient requesting something for her anxiety at this time. Will make provider aware of same.

## 2022-01-21 NOTE — Progress Notes (Signed)
CSW is still waiting a response from [facility]. CSW sent a SNF referral on 01/19/21 and has not heard from the facility at this time.

## 2022-01-21 NOTE — ED Notes (Signed)
Patient complaining of 10/10 left leg and knee pain. Will administer ordered PRN pain medication at this time.

## 2022-01-21 NOTE — ED Notes (Signed)
Pt in bed, family at bedside, pt cleaned up by tech, pure wick repositioned, pt has no complaints at this time, pt has no requests.

## 2022-01-22 DIAGNOSIS — M545 Low back pain, unspecified: Secondary | ICD-10-CM | POA: Diagnosis not present

## 2022-01-22 MED ORDER — LORAZEPAM 1 MG PO TABS
1.0000 mg | ORAL_TABLET | Freq: Four times a day (QID) | ORAL | Status: DC | PRN
  Administered 2022-01-22 – 2022-01-28 (×4): 1 mg via ORAL
  Filled 2022-01-22 (×6): qty 1

## 2022-01-22 NOTE — ED Notes (Signed)
Pt cleaned and protective barrier provided to her bottom. Bed pad changed, canister emptied, and call bell within reach. Pt voice concerns of bed alarm keep going off for no reason, nurse notified. No other complaints noted at this time.

## 2022-01-22 NOTE — ED Notes (Signed)
Patient is complaining of feeling anxious and sad to due to death her family. Comfort measures provided and ED provider made aware.

## 2022-01-22 NOTE — Progress Notes (Signed)
CSW spoke with Stephens November in admissions at [facility]. A final decision has not been made at this time in regards to SNF placement. CSW will continue to follow-up.

## 2022-01-22 NOTE — ED Provider Notes (Signed)
Patient complains of severe anxiety.  She relates death in the family while she has been in the emergency department.  She is tearful.  She states lorazepam did help her, I have ordered lorazepam on a as needed basis.   Beth Fuel, MD 61/60/73 [phone]

## 2022-01-22 NOTE — Progress Notes (Signed)
   01/22/22 2050  Spiritual Encounters  Type of Visit Initial  Care provided to: Patient  Referral source Nurse (RN/NT/LPN)  Reason for visit Grief/loss  OnCall Visit Yes  Spiritual Framework  Presenting Themes Significant life change;Courage hope and growth  Interventions  Spiritual Care Interventions Made Compassionate presence;Reflective listening;Supported grief process   Chaplain followed up on Spiritual Consult per [employer]. Patient indicated that her stepmother died and she was feeling sorrowful. She also shared that her stepfather's health is in decline as well. Chaplain provided words of comfort and emotional/spiritual support.    Melody Haver, Resident Chaplain [phone]

## 2022-01-22 NOTE — Progress Notes (Signed)
   01/22/22 1600  Spiritual Encounters  Type of Visit Initial;Attempt (pt unavailable)  Care provided to: Pt not available  Referral source Nurse (RN/NT/LPN)  Reason for visit Grief/loss   Chaplain responded to a spiritual consult request for support. The patient was unavailable both times I stopped by.  We will continue to monitor.   Danice Goltz [facility]  [phone]

## 2022-01-23 DIAGNOSIS — M545 Low back pain, unspecified: Secondary | ICD-10-CM | POA: Diagnosis not present

## 2022-01-23 NOTE — ED Provider Notes (Signed)
Emergency Medicine Observation Re-evaluation Note  Beth Marshall is a 57 y.o. female, seen on rounds today.  Pt initially presented to the ED for complaints of Back Pain Currently, the patient is sitting in bed.  Physical Exam  BP 118/74   Pulse 72   Temp 98 F (36.7 C) (Oral)   Resp 18   Ht '5\' 6"'$  (1.676 m)   Wt (!) 186 kg   SpO2 98%   BMI 66.18 kg/m  Physical Exam General: No acute distress Cardiac: Normal rate Lungs: No increased work of breathing Psych: Calm  ED Course / MDM  EKG:   I have reviewed the labs performed to date as well as medications administered while in observation.  Recent changes in the last 24 hours include none.  Plan  Current plan is for attempting to find skilled nursing home placement.Malvin Johns, MD 01/23/22 [phone]

## 2022-01-24 DIAGNOSIS — M545 Low back pain, unspecified: Secondary | ICD-10-CM | POA: Diagnosis not present

## 2022-01-24 NOTE — ED Provider Notes (Signed)
Emergency Medicine Observation Re-evaluation Note  Beth Marshall is a 57 y.o. female, seen on rounds today.  Pt initially presented to the ED for complaints of Back Pain Currently, the patient is sitting up in bed, working on her iPad.  Physical Exam  BP 127/86   Pulse 73   Temp 97.9 F (36.6 C) (Oral)   Resp 18   Ht '5\' 6"'$  (1.676 m)   Wt (!) 186 kg   SpO2 100%   BMI 66.18 kg/m  Physical Exam General: No acute distress Cardiac: Normal rate Lungs: No increased work of breathing Psych: Calm  ED Course / MDM  EKG:   I have reviewed the labs performed to date as well as medications administered while in observation.  Recent changes in the last 24 hours include none.  Plan  Current plan is for nursing home placement.    Malvin Johns, MD 01/24/22 [phone]

## 2022-01-24 NOTE — ED Notes (Signed)
Purewick, chucks pad, and linen changed for pt. Pt remains in lowest position with call light within reach.

## 2022-01-25 DIAGNOSIS — M545 Low back pain, unspecified: Secondary | ICD-10-CM | POA: Diagnosis not present

## 2022-01-25 NOTE — Progress Notes (Signed)
Physical Therapy Treatment Patient Details Name: Beth Marshall MRN: [id number] DOB: 10/30/1965 Today's Date: 01/25/2022   History of Present Illness Pt is a 57 y.o. female admitted from SNF rehab on 11/12/21 with back pain and x4 month and intermittent L leg pain, weakness, and numbness. Pt has been at [facility] for rehab since June secondary to chronic back pain s/p L tib fib fx (BLE WBAT). PMH includes OA, back pain, hx of DVT, edema, factor V leiden mutation, GERD, anxiety, hypokalemia, insomnia, intrinsic asthma, lipoma, migraine, morbid obesity, tachycardia.    PT Comments    Pt admitted with above diagnosis. Pt was able to sit to EOB with Modif I with rail and no physical assist today.  Pt could not stand fully upright today to RW.  Unsure of why pt could not stand today.  Laid pt back down and pt performed Ue and LE exercises in full sitting position of Kreg bed.   Pt currently with functional limitations due to balance and endurance deficits. Pt will benefit from skilled PT to increase their independence and safety with mobility to allow discharge to the venue listed below.      Recommendations for follow up therapy are one component of a multi-disciplinary discharge planning process, led by the attending physician.  Recommendations may be updated based on patient status, additional functional criteria and insurance authorization.  Follow Up Recommendations  Skilled nursing-short term rehab (<3 hours/day) Can patient physically be transported by private vehicle: No   Assistance Recommended at Discharge Frequent or constant Supervision/Assistance  Patient can return home with the following Two people to help with walking and/or transfers;A lot of help with bathing/dressing/bathroom;Assistance with cooking/housework;Assist for transportation;Help with stairs or ramp for entrance   Equipment Recommendations  Other (comment) (defer to SNF)    Recommendations for Other Services OT  consult     Precautions / Restrictions Precautions Precautions: Fall Precaution Comments: bladder/bowel incontinence Restrictions Weight Bearing Restrictions: No LLE Weight Bearing: Weight bearing as tolerated Other Position/Activity Restrictions: confirmed WBAT BLE per Dr. Nechama Guard 11/14/21     Mobility  Bed Mobility Overal bed mobility: Needs Assistance Bed Mobility: Rolling Rolling: Supervision Sidelying to sit: Supervision (with rail)   Sit to supine: Min assist (for left LE, tried to use leg lifter)   General bed mobility comments: increased time and effort, incr assist to get Les on bed as pt not on bed well when laying down.    Transfers Overall transfer level: Needs assistance Equipment used: Rolling walker (2 wheels)   Sit to Stand: +2 physical assistance, +2 safety/equipment, From elevated surface, Total assist           General transfer comment: Could not clear buttocks off bed more than 1 inch with 3 attempts to stand. Pt ended up scooting out and had to lay her back down for safety. Pt tearful about her stepmother passing away last week and unsure if she was internally distracted.    Ambulation/Gait               General Gait Details: unable   Stairs             Wheelchair Mobility    Modified Rankin (Stroke Patients Only)       Balance Overall balance assessment: Needs assistance Sitting-balance support: Feet supported, No upper extremity supported Sitting balance-[name]: Good Sitting balance - Comments: Able to sit EOB without UE support   Standing balance support: Bilateral upper extremity supported, During functional activity  Standing balance-[name]: Zero Standing balance comment: Pt could not clear buttocks off bed today even with 2 person assist.                            Cognition Arousal/Alertness: Awake/alert Behavior During Therapy: WFL for tasks assessed/performed Overall Cognitive Status:  Within Functional Limits for tasks assessed                                 General Comments: hyperverbose, decreased insight into deficits        Exercises General Exercises - Upper Extremity Shoulder Flexion: Strengthening, Both, 20 reps, Theraband Theraband Level (Shoulder Flexion): Level 4 (Blue) Shoulder ABduction:  (30) Theraband Level (Shoulder Abduction): Level 4 (Blue) Shoulder Horizontal ABduction: Strengthening, Both, Theraband, 20 reps Theraband Level (Shoulder Horizontal Abduction): Level 4 (Blue) Shoulder Horizontal ADduction: AROM, Both, Standing, Other reps (comment) (30 reps) Elbow Flexion: Strengthening, Both, 20 reps, Theraband Theraband Level (Elbow Flexion): Level 4 (Blue) Elbow Extension: Both, Strengthening, 20 reps, Theraband Theraband Level (Elbow Extension): Level 4 (Blue) General Exercises - Lower Extremity Ankle Circles/Pumps: AROM, 10 reps, Supine, Both Quad Sets: AROM, Both, 5 reps, Seated Gluteal Sets: AROM, Both, 10 reps, Seated Long Arc Quad: AROM, Both, 20 reps, Seated (with min manual resistance) Hip Flexion/Marching: AROM, 10 reps, Both, Seated Other Exercises Other Exercises: weight shifting Bridging x 5 reps     General Comments General comments (skin integrity, edema, etc.): Noted small area of bleeding on back and upper buttocks. Nurse is aware as pt states she scratched herself.      Pertinent Vitals/Pain Pain Assessment Pain Assessment: Faces Faces Pain Scale: Hurts a little bit Pain Location: L knee Pain Descriptors / Indicators: Cramping Pain Intervention(s): Limited activity within patient's tolerance, Monitored during session, Premedicated before session, Repositioned    Home Living                          Prior Function            PT Goals (current goals can now be found in the care plan section) Acute Rehab PT Goals Patient Stated Goal: to go to a new SNF for rehab then home Progress towards  PT goals: Progressing toward goals    Frequency    Min 2X/week      PT Plan Current plan remains appropriate    Co-evaluation              AM-PAC PT "6 Clicks" Mobility   Outcome Measure  Help needed turning from your back to your side while in a flat bed without using bedrails?: A Little Help needed moving from lying on your back to sitting on the side of a flat bed without using bedrails?: A Lot Help needed moving to and from a bed to a chair (including a wheelchair)?: Total Help needed standing up from a chair using your arms (e.g., wheelchair or bedside chair)?: Total Help needed to walk in hospital room?: Total Help needed climbing 3-5 steps with a railing? : Total 6 Click Score: 9    End of Session Equipment Utilized During Treatment: Gait belt Activity Tolerance: Patient limited by fatigue Patient left: with call bell/phone within reach;in bed Nurse Communication: Mobility status;Need for lift equipment PT Visit Diagnosis: Muscle weakness (generalized) (M62.81);Difficulty in walking, not elsewhere classified (R26.2)     Time:  3254-9826 PT Time Calculation (min) (ACUTE ONLY): 33 min  Charges:  $Therapeutic Exercise: 8-22 mins $Therapeutic Activity: 8-22 mins                     [facility] M,PT Acute Rehab Services [phone]    Alvira Philips 01/25/2022, 2:52 PM

## 2022-01-25 NOTE — Progress Notes (Signed)
[facility] is reviewing patient for placement.

## 2022-01-25 NOTE — ED Notes (Signed)
Changed chux

## 2022-01-25 NOTE — ED Provider Notes (Signed)
Emergency Medicine Observation Re-evaluation Note  Beth Marshall is a 57 y.o. female, seen on rounds today.  Pt initially presented to the ED for complaints of Back Pain Currently, the patient is doing word search puzzles sitting up in her bed without acute distress, just finished breakfast.  Physical Exam  BP 111/73   Pulse 80   Temp 98.9 F (37.2 C) (Oral)   Resp 15   Ht '5\' 6"'$  (1.676 m)   Wt (!) 186 kg   SpO2 99%   BMI 66.18 kg/m  Physical Exam General: Sitting in bed without acute distress or agitation Cardiac: No murmur on minus auscultation Lungs: Clear breath sounds bilaterally Psych: No acute agitation  ED Course / MDM  EKG:   I have reviewed the labs performed to date as well as medications administered while in observation.  Recent changes in the last 24 hours include none reported by nursing.  Plan  Current plan is for awaiting nursing home placement.    Braylen Staller, Gwenyth Allegra, MD 01/25/22 [phone]

## 2022-01-25 NOTE — ED Notes (Signed)
Pt's purewick changed and brief and linens changed. Pt complains of "7/10" left leg pain and requests pain med.

## 2022-01-26 DIAGNOSIS — M545 Low back pain, unspecified: Secondary | ICD-10-CM | POA: Diagnosis not present

## 2022-01-26 LAB — PROTIME-INR
INR: 2.7 — ABNORMAL HIGH (ref 0.8–1.2)
Prothrombin Time: 28.4 seconds — ABNORMAL HIGH (ref 11.4–15.2)

## 2022-01-26 NOTE — ED Notes (Signed)
Pt cleaned and changed  

## 2022-01-26 NOTE — Progress Notes (Signed)
[facility] for Warfarin Indication:  paroxysmal A-fib and recurrent DVT history of factor V Leiden mutation and protein S deficiency  Allergies  Allergen Reactions   Cephalexin Other (See Comments)    Unknown reaction per [facility]    Chocolate Other (See Comments)    Unknown reaction per MAR   Codeine Nausea And Vomiting   Diphenhydramine Hcl Itching   Ranitidine Rash    Patient Measurements: Height: '5\' 6"'$  (167.6 cm) Weight: (!) 186 kg (410 lb) IBW/kg (Calculated) : 59.3  Vital Signs: Temp: 97.7 F (36.5 C) (01/09 1250) Temp Source: Oral (01/09 1250) BP: 113/67 (01/09 1250) Pulse Rate: 62 (01/09 1250)  Labs: Recent Labs    01/26/22 0456  LABPROT 28.4*  INR 2.7*     CrCl cannot be calculated (Patient's most recent lab result is older than the maximum 21 days allowed.).  Assessment: 57 yo F with a history of factor V leiden, Afib and protein S deficiency to continue on warfarin.  Patient is morbidly obese, bedbound and has recent left tib/fib.   PTA dosing: 8.'5mg'$  daily except for '8mg'$  on Tue/Thur  INR remains therapeutic and stable on current regimen. Weekly INR - today 2.7. CBC stable on last check on 12/22.  Goal of Therapy:  INR goal 2-3 Monitor platelets by anticoagulation protocol: Yes   Plan:  Continue warfarin '10mg'$  PO daily Weekly INR, monitor s/s bleeding  Luisa Hart, PharmD, BCPS Clinical Pharmacist 01/26/2022 1:54 PM   Please refer to [facility] for pharmacy phone number

## 2022-01-26 NOTE — ED Provider Notes (Signed)
Emergency Medicine Observation Re-evaluation Note  Beth Marshall is a 57 y.o. female, seen on rounds today.  Pt initially presented to the ED for complaints of Back Pain Currently, the patient is sleeping.  Physical Exam  BP 124/63 (BP Location: Right Arm)   Pulse 68   Temp (!) 97.5 F (36.4 C) (Oral)   Resp 16   Ht '5\' 6"'$  (1.676 m)   Wt (!) 186 kg   SpO2 98%   BMI 66.18 kg/m  Physical Exam General: Sleeping Cardiac: Extremities perfused Lungs: Breathing is unlabored Psych: Deferred  ED Course / MDM  EKG:   I have reviewed the labs performed to date as well as medications administered while in observation.  Recent changes in the last 24 hours include none.  Plan  Current plan is for SNF placement.    Beth Pick, MD 01/26/22 0830

## 2022-01-27 DIAGNOSIS — M545 Low back pain, unspecified: Secondary | ICD-10-CM | POA: Diagnosis not present

## 2022-01-27 NOTE — ED Notes (Signed)
Patient is resting comfortably. 

## 2022-01-27 NOTE — ED Notes (Signed)
PT called to nurses station requesting changing. Pt rolled, cleaned, and changed. No complaints at this time.

## 2022-01-27 NOTE — ED Notes (Signed)
Pt in bed, emptied dedicated suction container.  Pt states that she feels wet, cleaned pt and changed chux, no redness noted, pt tolerated well, pt is able to roll from side to side.

## 2022-01-27 NOTE — ED Provider Notes (Signed)
Emergency Medicine Observation Re-evaluation Note  Beth Marshall is a 57 y.o. female, seen on rounds today.  Pt initially presented to the ED for complaints of Back Pain Currently, the patient is sitting in bed watching TV after just finishing her breakfast.  Physical Exam  BP 113/67 (BP Location: Right Wrist)   Pulse 62   Temp 97.7 F (36.5 C) (Oral)   Resp 16   Ht '5\' 6"'$  (1.676 m)   Wt (!) 186 kg   SpO2 99%   BMI 66.18 kg/m  Physical Exam General: Sitting without acute agitation Cardiac: No murmur initially Lungs: Clear bilaterally Psych: No acute agitation  ED Course / MDM  EKG:   I have reviewed the labs performed to date as well as medications administered while in observation.  Recent changes in the last 24 hours include none reported by nursing.  Plan  Current plan is for awaiting placement.    Ahnna Dungan, Gwenyth Allegra, MD 01/27/22 [phone]

## 2022-01-27 NOTE — Progress Notes (Signed)
CSW left a voicemail with admissions Stephens November at [facility].

## 2022-01-27 NOTE — Progress Notes (Signed)
[facility] declined patient for placement.

## 2022-01-27 NOTE — ED Notes (Signed)
Pt sitting up in bed eating, pt reports some back pain, pt has no requests at this time.

## 2022-01-27 NOTE — Progress Notes (Signed)
Occupational Therapy Treatment Patient Details Name: Beth Marshall MRN: [id number] DOB: 1965-03-17 Today's Date: 01/27/2022   History of present illness Pt is a 57 y.o. female admitted from SNF rehab on 11/12/21 with back pain and x4 month and intermittent L leg pain, weakness, and numbness. Pt has been at [facility] for rehab since June secondary to chronic back pain s/p L tib fib fx (BLE WBAT). PMH includes OA, back pain, hx of DVT, edema, factor V leiden mutation, GERD, anxiety, hypokalemia, insomnia, intrinsic asthma, lipoma, migraine, morbid obesity, tachycardia.   OT comments  Focus on tilting today to improve WB through BLE with pt eager to participate though limited by reported LE and back pain despite premedication. Pt able to tolerate 5 min at 60* and 5 min at 45* with various exercises. Pt able to demo ability to scoot self up in bed without assist and proficient in UE HEP with level 4 theraband. Pt would benefit from increased resistance for BUE HEP. Continue to rec SNF at DC unless family able to provide bed level ADL assist and hoyer transfers OOB at home. Pt reports sister comes to assist with bed baths often here at hospital.   Recommendations for follow up therapy are one component of a multi-disciplinary discharge planning process, led by the attending physician.  Recommendations may be updated based on patient status, additional functional criteria and insurance authorization.    Follow Up Recommendations  Skilled nursing-short term rehab (<3 hours/day)     Assistance Recommended at Discharge Frequent or constant Supervision/Assistance  Patient can return home with the following  Two people to help with walking and/or transfers;Two people to help with bathing/dressing/bathroom   Equipment Recommendations  Hospital bed;Wheelchair cushion (measurements OT);Wheelchair (measurements OT);Other (comment) (hoyer lift)    Recommendations for Other Services       Precautions / Restrictions Precautions Precautions: Fall Precaution Comments: bladder/bowel incontinence Restrictions Weight Bearing Restrictions: No LLE Weight Bearing: Weight bearing as tolerated Other Position/Activity Restrictions: confirmed WBAT BLE per Dr. Nechama Guard 11/14/21       Mobility Bed Mobility Overal bed mobility: Needs Assistance             General bed mobility comments: able to scoot self up in bed w/ B feet and bedrails MOD I    Transfers                         Balance                                           ADL either performed or assessed with clinical judgement   ADL Overall ADL's : Needs assistance/impaired                     Lower Body Dressing: Total assistance;Bed level Lower Body Dressing Details (indicate cue type and reason): for slippers               General ADL Comments: Focus on tilting up to 60* to improve WB through BLE for transfers. Pt able to tolerate 5 min at 60*, 5 min at 45* for various exercises though limited by reported pain    Extremity/Trunk Assessment Upper Extremity Assessment Upper Extremity Assessment: Overall WFL for tasks assessed   Lower Extremity Assessment Lower Extremity Assessment: Defer to PT evaluation  Vision   Vision Assessment?: No apparent visual deficits   Perception     Praxis      Cognition Arousal/Alertness: Awake/alert Behavior During Therapy: WFL for tasks assessed/performed Overall Cognitive Status: Within Functional Limits for tasks assessed                                 General Comments: hyperverbose, decreased insight into deficits        Exercises Exercises: General Upper Extremity, Other exercises General Exercises - Upper Extremity Shoulder Flexion: Strengthening, Both, 20 reps, Theraband Theraband Level (Shoulder Flexion): Level 4 (Blue) Shoulder ABduction: Strengthening, Both, Theraband Theraband Level  (Shoulder Abduction): Level 4 (Blue) Shoulder Horizontal ABduction: Strengthening, Both, Theraband, 20 reps Theraband Level (Shoulder Horizontal Abduction): Level 4 (Blue) Elbow Flexion: Strengthening, Both, 20 reps, Theraband Theraband Level (Elbow Flexion): Level 4 (Blue) Elbow Extension: Both, Strengthening, 20 reps, Theraband Theraband Level (Elbow Extension): Level 4 (Blue) Other Exercises Other Exercises: weight shifting, lifting toe/heel in tilt bed at 60* and 45*    Shoulder Instructions       General Comments      Pertinent Vitals/ Pain       Pain Assessment Pain Assessment: Faces Faces Pain Scale: Hurts little more Pain Location: L knee, low back Pain Descriptors / Indicators: Grimacing, Guarding Pain Intervention(s): Limited activity within patient's tolerance, Monitored during session, Premedicated before session  Home Living                                          Prior Functioning/Environment              Frequency  Min 1X/week        Progress Toward Goals  OT Goals(current goals can now be found in the care plan section)  Progress towards OT goals: OT to reassess next treatment  Acute Rehab OT Goals Patient Stated Goal: work on standing OT Goal Formulation: With patient Time For Goal Achievement: 02/03/22 Potential to Achieve Goals: Good ADL Goals Pt Will Transfer to Toilet: with mod assist;with +2 assist;stand pivot transfer;squat pivot transfer;bedside commode Additional ADL Goal #1: Pt will be S in and OOB for basic ADLs with lateral scoot with or without transfer board Additional ADL Goal #2: Pt will be able to stand at beside with min A for >1 to A for LB ADLs  Plan Discharge plan remains appropriate    Co-evaluation                 AM-PAC OT "6 Clicks" Daily Activity     Outcome Measure   Help from another person eating meals?: None Help from another person taking care of personal grooming?: A Little Help  from another person toileting, which includes using toliet, bedpan, or urinal?: Total Help from another person bathing (including washing, rinsing, drying)?: A Lot Help from another person to put on and taking off regular upper body clothing?: Total Help from another person to put on and taking off regular lower body clothing?: Total 6 Click Score: 12    End of Session    OT Visit Diagnosis: Other abnormalities of gait and mobility (R26.89);Muscle weakness (generalized) (M62.81);Pain Pain - Right/Left: Left Pain - part of body: Knee   Activity Tolerance Patient tolerated treatment well;Patient limited by pain   Patient Left in bed;with call bell/phone within  reach   Nurse Communication          Time: [phone] OT Time Calculation (min): 32 min  Charges: OT General Charges $OT Visit: 1 Visit OT Treatments $Therapeutic Activity: 8-22 mins $Therapeutic Exercise: 8-22 mins  Malachy Chamber, OTR/L Acute Rehab Services Office: [phone]   Layla Maw 01/27/2022, 1:39 PM

## 2022-01-28 DIAGNOSIS — M545 Low back pain, unspecified: Secondary | ICD-10-CM | POA: Diagnosis not present

## 2022-01-28 NOTE — Progress Notes (Addendum)
Physical Therapy Treatment Patient Details Name: Beth Marshall MRN: [id number] DOB: [birth year] Today's Date: 01/28/2022   History of Present Illness Pt is a 57 y.o. female admitted from SNF rehab on 11/12/21 with back pain and x4 month and intermittent L leg pain, weakness, and numbness. Pt has been at [facility] for rehab since June secondary to chronic back pain s/p L tib fib fx (BLE WBAT). PMH includes OA, back pain, hx of DVT, edema, factor V leiden mutation, GERD, anxiety, hypokalemia, insomnia, intrinsic asthma, lipoma, migraine, morbid obesity, tachycardia.    PT Comments    Pt admitted with above diagnosis. Pt was able to tolerate tilt up to  65 degrees for 10 minutes.  Performed weight shifting and UE and LE exercises in standing.  Does not tolerate over 10 min as her left LE becomes numb and pt is too uncomfortable to keep standing.  Pt currently with functional limitations due to balance and endurance deficits. Pt will benefit from skilled PT to increase their independence and safety with mobility to allow discharge to the venue listed below.      Recommendations for follow up therapy are one component of a multi-disciplinary discharge planning process, led by the attending physician.  Recommendations may be updated based on patient status, additional functional criteria and insurance authorization.  Follow Up Recommendations  Skilled nursing-short term rehab (<3 hours/day) Can patient physically be transported by private vehicle: No   Assistance Recommended at Discharge Frequent or constant Supervision/Assistance  Patient can return home with the following Two people to help with walking and/or transfers;A lot of help with bathing/dressing/bathroom;Assistance with cooking/housework;Assist for transportation;Help with stairs or ramp for entrance   Equipment Recommendations  Other (comment) (defer to SNF)    Recommendations for Other Services OT consult     Precautions /  Restrictions Precautions Precautions: Fall Precaution Comments: bladder/bowel incontinence Restrictions Weight Bearing Restrictions: No LLE Weight Bearing: Weight bearing as tolerated Other Position/Activity Restrictions: confirmed WBAT BLE per Dr. Nechama Guard 11/14/21     Mobility  Bed Mobility                 Start Time: 1045 Angle: 65 degrees Total Minutes in Angle: 10 minutes Patient Response: Cooperative  Transfers       Sit to Stand: +2 physical assistance, +2 safety/equipment, From elevated surface, Total assist             Transfer via Lift Equipment:  (Tilt bed)  Ambulation/Gait               General Gait Details: unable   Stairs             Wheelchair Mobility    Modified Rankin (Stroke Patients Only)       Balance                                            Cognition Arousal/Alertness: Awake/alert Behavior During Therapy: WFL for tasks assessed/performed Overall Cognitive Status: Within Functional Limits for tasks assessed                                 General Comments: hyperverbose, decreased insight into deficits        Exercises General Exercises - Upper Extremity Shoulder Flexion: Strengthening, Both, 20 reps, Theraband Theraband Level (Shoulder Flexion): Level  4 (Blue) Shoulder ABduction: Strengthening, Both, Theraband Theraband Level (Shoulder Abduction): Level 4 (Blue) Shoulder Horizontal ABduction: Strengthening, Both, Theraband, 20 reps Theraband Level (Shoulder Horizontal Abduction): Level 4 (Blue) Shoulder Horizontal ADduction: AROM, Both, Standing, Other reps (comment) (30 reps) Elbow Flexion: Strengthening, Both, 20 reps, Theraband Theraband Level (Elbow Flexion): Level 4 (Blue) Elbow Extension: Both, Strengthening, 20 reps, Theraband Theraband Level (Elbow Extension): Level 4 (Blue) General Exercises - Lower Extremity Ankle Circles/Pumps: AROM, 10 reps, Supine, Both Quad  Sets: AROM, Both, 5 reps, Seated Gluteal Sets: AROM, Both, 10 reps, Seated Heel Raises: AROM, 15 reps, Other (comment), Both (partial tilt to 61 degrees) Other Exercises Other Exercises: weight shifting, lifting toe/heel in tilt bed at 61 degrees    General Comments        Pertinent Vitals/Pain Pain Assessment Pain Assessment: Faces Faces Pain Scale: Hurts little more Pain Location: L knee, low back Pain Descriptors / Indicators: Grimacing, Guarding Pain Intervention(s): Limited activity within patient's tolerance, Monitored during session, Repositioned, Premedicated before session    Home Living                          Prior Function            PT Goals (current goals can now be found in the care plan section) Acute Rehab PT Goals Patient Stated Goal: to go to a new SNF for rehab then home Progress towards PT goals: Progressing toward goals    Frequency    Min 2X/week      PT Plan Current plan remains appropriate    Co-evaluation              AM-PAC PT "6 Clicks" Mobility   Outcome Measure  Help needed turning from your back to your side while in a flat bed without using bedrails?: A Little Help needed moving from lying on your back to sitting on the side of a flat bed without using bedrails?: A Lot Help needed moving to and from a bed to a chair (including a wheelchair)?: Total Help needed standing up from a chair using your arms (e.g., wheelchair or bedside chair)?: Total Help needed to walk in hospital room?: Total Help needed climbing 3-5 steps with a railing? : Total 6 Click Score: 9    End of Session Equipment Utilized During Treatment: Gait belt Activity Tolerance: Patient limited by fatigue Patient left: with call bell/phone within reach;in bed Nurse Communication: Mobility status;Need for lift equipment PT Visit Diagnosis: Muscle weakness (generalized) (M62.81);Difficulty in walking, not elsewhere classified (R26.2)     Time:  -  4098-1191    Charges:    Exercise, Therapeutic activity                    [facility] M,PT Acute Rehab Services [place] 01/28/2022, 6:36 PM

## 2022-01-28 NOTE — ED Provider Notes (Signed)
Emergency Medicine Observation Re-evaluation Note  Beth Marshall is a 57 y.o. female, seen on rounds today.  Pt initially presented to the ED for complaints of Back Pain Currently, the patient is asleep.  Pt has been waiting for placement since November 12, 2021.  Pharmacy has been following INR.  It was 2.7 on the 9th.  PT and OT have been seeing patient while she's been here.  Physical Exam  BP (!) 118/56 (BP Location: Right Wrist)   Pulse 67   Temp 97.9 F (36.6 C) (Oral)   Resp 17   Ht '5\' 6"'$  (1.676 m)   Wt (!) 186 kg   SpO2 94%   BMI 66.18 kg/m  Physical Exam General: asleep Cardiac: rr Lungs: clear Psych: asleep  ED Course / MDM  EKG:   I have reviewed the labs performed to date as well as medications administered while in observation.  Recent changes in the last 24 hours include [facility] declined patient.  Plan  Current plan is for awaiting placement.    Isla Pence, MD 01/28/22 [phone]

## 2022-01-28 NOTE — ED Notes (Signed)
Pt asleep, no distress noted.

## 2022-01-28 NOTE — Progress Notes (Signed)
CSW will continue to monitor patients weight in hopes to find a SNF facility.

## 2022-01-28 NOTE — ED Notes (Signed)
Writer changed pad under patient. Pt declines any further needs at this time.

## 2022-01-28 NOTE — ED Notes (Signed)
Writer rounded on patient, patient asleep and in no acute distress at this time.

## 2022-01-29 DIAGNOSIS — M545 Low back pain, unspecified: Secondary | ICD-10-CM | POA: Diagnosis not present

## 2022-01-29 NOTE — Progress Notes (Signed)
CSW will continue to monitor patients weight in hopes to find a SNF facility

## 2022-01-29 NOTE — Progress Notes (Signed)
Physical Therapy Treatment Patient Details Name: Beth Marshall MRN: [id number] DOB: 1965/03/30 Today's Date: 01/29/2022   History of Present Illness Pt is a 57 y.o. female admitted from SNF rehab on 11/12/21 with back pain and x4 month and intermittent L leg pain, weakness, and numbness. Pt has been at [facility] for rehab since June secondary to chronic back pain s/p L tib fib fx (BLE WBAT). PMH includes OA, back pain, hx of DVT, edema, factor V leiden mutation, GERD, anxiety, hypokalemia, insomnia, intrinsic asthma, lipoma, migraine, morbid obesity, tachycardia.    PT Comments    Pt admitted with above diagnosis. Pt able to use sliding board to get to wheelchair with mod to min assist of 2 persons.  However, pt could not slide back with sliding board and Tenor lift would not work long enough to get pt back to bed. Left pt in wheelchair in room until PT can locate alternative.  Pt currently with functional limitations due tobalance and endurance deficits. Pt will benefit from skilled PT to increase their independence and safety with mobility to allow discharge to the venue listed below.      Recommendations for follow up therapy are one component of a multi-disciplinary discharge planning process, led by the attending physician.  Recommendations may be updated based on patient status, additional functional criteria and insurance authorization.  Follow Up Recommendations  Skilled nursing-short term rehab (<3 hours/day) Can patient physically be transported by private vehicle: No   Assistance Recommended at Discharge Frequent or constant Supervision/Assistance  Patient can return home with the following Two people to help with walking and/or transfers;A lot of help with bathing/dressing/bathroom;Assistance with cooking/housework;Assist for transportation;Help with stairs or ramp for entrance   Equipment Recommendations  Other (comment) (defer to SNF)    Recommendations for Other  Services OT consult     Precautions / Restrictions Precautions Precautions: Fall Precaution Comments: bladder/bowel incontinence Restrictions Weight Bearing Restrictions: No LLE Weight Bearing: Weight bearing as tolerated Other Position/Activity Restrictions: confirmed WBAT BLE per Dr. Nechama Guard 11/14/21     Mobility  Bed Mobility Overal bed mobility: Needs Assistance Bed Mobility: Rolling Rolling: Supervision Sidelying to sit: Supervision (with rail)       General bed mobility comments: Pt was able to sit up with supervision using momentum.    Transfers Overall transfer level: Needs assistance   Transfers: Bed to chair/wheelchair/BSC            Lateral/Scoot Transfers: Mod assist, Min assist, +2 physical assistance, With slide board, From elevated surface General transfer comment: Pt needed mod assist to place sliding board under her leg and bottom. with use of pad, assisted pt to scoot to wheelchair via sliding board with mod assist to initiate and pt progreessing to min assist of 2.  Decided to go ahead and slide pt back to bed as we were practicing these transfers today.  Pt could not perform sliding board transfer to slightly elevated surface (bed would not go to downhill or level even with mattress deflated) even with +3 assist(OT came in to assist).  Pt attempted and got 1/2 way but was sliding and determined pt needed to go back to wheelchair and regroup.  Tried to go into bed anteriorly as well and pt unable to get the lift of her buttocks to scoot into bed that way.  Went and obtained Tenor lift to get pt back to bed and unable to lift her with this as the battery died when pt was barely off  wheelchair therefore lowerred pt back into wheelchair and left her in chair to go find an alternative way to get pt back to bed. Transfer via Dealer Details: unable   Environmental education officer Rankin (Stroke Patients Only)       Balance Overall balance assessment: Needs assistance Sitting-balance support: Feet supported, No upper extremity supported Sitting balance-[name]: Good Sitting balance - Comments: Able to sit EOB without UE support                                    Cognition Arousal/Alertness: Awake/alert Behavior During Therapy: WFL for tasks assessed/performed Overall Cognitive Status: Within Functional Limits for tasks assessed                                 General Comments: hyperverbose, decreased insight into deficits        Exercises      General Comments        Pertinent Vitals/Pain Pain Assessment Pain Assessment: Faces Faces Pain Scale: Hurts a little bit Pain Location: BLE Pain Descriptors / Indicators: Grimacing, Guarding Pain Intervention(s): Limited activity within patient's tolerance, Monitored during session, Premedicated before session, Repositioned    Home Living                          Prior Function            PT Goals (current goals can now be found in the care plan section) Acute Rehab PT Goals Patient Stated Goal: to go to a new SNF for rehab then home Progress towards PT goals: Progressing toward goals    Frequency    Min 2X/week      PT Plan Current plan remains appropriate    Co-evaluation              AM-PAC PT "6 Clicks" Mobility   Outcome Measure  Help needed turning from your back to your side while in a flat bed without using bedrails?: A Little Help needed moving from lying on your back to sitting on the side of a flat bed without using bedrails?: A Lot Help needed moving to and from a bed to a chair (including a wheelchair)?: Total Help needed standing up from a chair using your arms (e.g., wheelchair or bedside chair)?: Total Help needed to walk in hospital room?: Total Help needed climbing 3-5 steps with a railing? :  Total 6 Click Score: 9    End of Session Equipment Utilized During Treatment: Gait belt Activity Tolerance: Patient limited by fatigue Patient left: with call bell/phone within reach;in chair Nurse Communication: Mobility status;Need for lift equipment;Patient requests pain meds PT Visit Diagnosis: Muscle weakness (generalized) (M62.81);Difficulty in walking, not elsewhere classified (R26.2)     Time: 3846-6599 PT Time Calculation (min) (ACUTE ONLY): 49 min  Charges:  $Therapeutic Activity: 8-22 mins $Self Care/Home Management: 8-22                     [facility] M,PT Acute Rehab Services [place] 01/29/2022, 4:26 PM

## 2022-01-29 NOTE — Progress Notes (Signed)
01/29/22 1629  PT Visit Information  Last PT Received On 01/29/22  Assistance Needed +3 or more (for OOB transfers)  History of Present Illness Pt is a 57 y.o. female admitted from SNF rehab on 11/12/21 with back pain and x4 month and intermittent L leg pain, weakness, and numbness. Pt has been at [facility] for rehab since June secondary to chronic back pain s/p L tib fib fx (BLE WBAT). PMH includes OA, back pain, hx of DVT, edema, factor V leiden mutation, GERD, anxiety, hypokalemia, insomnia, intrinsic asthma, lipoma, migraine, morbid obesity, tachycardia.  Subjective Data  Patient Stated Goal to go to a new SNF for rehab then home  Precautions  Precautions Fall  Precaution Comments bladder/bowel incontinence  Restrictions  Weight Bearing Restrictions No  LLE Weight Bearing WBAT  Other Position/Activity Restrictions confirmed WBAT BLE per Dr. Nechama Guard 11/14/21  Pain Assessment  Pain Assessment Faces  Faces Pain Scale 2  Pain Location BLE  Pain Descriptors / Indicators Grimacing;Guarding  Pain Intervention(s) Limited activity within patient's tolerance;Monitored during session;Repositioned;Premedicated before session  Cognition  Arousal/Alertness Awake/alert  Behavior During Therapy [facility] for tasks assessed/performed  Overall Cognitive Status Within Functional Limits for tasks assessed  General Comments hyperverbose, decreased insight into deficits  Transfers  Transfers Bed to chair/wheelchair/BSC  Transfer via [place] transfer comment Located a battery that was charged and was able to get pt safely back to bed with the Tenor.  Nurse assisted this PT to get pt back to bed.  Ambulation/Gait  General Gait Details unable  PT - End of Session  Activity Tolerance Patient limited by fatigue  Patient left with call bell/phone within reach;in bed  Nurse Communication Mobility status;Need for lift equipment   PT - Assessment/Plan  PT Plan Current plan  remains appropriate  PT Visit Diagnosis Muscle weakness (generalized) (M62.81);Difficulty in walking, not elsewhere classified (R26.2)  PT Frequency (ACUTE ONLY) Min 2X/week  Recommendations for Other Services OT consult  Follow Up Recommendations Skilled nursing-short term rehab (<3 hours/day)  Can patient physically be transported by private vehicle No  Assistance recommended at discharge Frequent or constant Supervision/Assistance  Patient can return home with the following Two people to help with walking and/or transfers;A lot of help with bathing/dressing/bathroom;Assistance with cooking/housework;Assist for transportation;Help with stairs or ramp for entrance  PT equipment Other (comment) (defer to SNF)  AM-PAC PT "6 Clicks" Mobility Outcome Measure (Version 2)  Help needed turning from your back to your side while in a flat bed without using bedrails? 3  Help needed moving from lying on your back to sitting on the side of a flat bed without using bedrails? 2  Help needed moving to and from a bed to a chair (including a wheelchair)? 1  Help needed standing up from a chair using your arms (e.g., wheelchair or bedside chair)? 1  Help needed to walk in hospital room? 1  Help needed climbing 3-5 steps with a railing?  1  6 Click Score 9  Consider Recommendation of Discharge To: CIR/SNF/LTACH  Progressive Mobility  What is the highest level of mobility based on the progressive mobility assessment? Level 2 (Chairfast) - Balance while sitting on edge of bed and cannot stand  Activity Moved into chair position in bed  PT Goal Progression  Progress towards PT goals Progressing toward goals  PT Time Calculation  PT Start Time (ACUTE ONLY) 1300  PT Stop Time (ACUTE ONLY) 1325  PT Time Calculation (min) (ACUTE ONLY)  25 min  PT General Charges  $$ ACUTE PT VISIT 1 Visit  PT Treatments  $Therapeutic Activity 23-37 mins   Assisted nurse to get pt back to bed with Tenor lift.  Took incr time  to locate lift as well as a charged battery.  Pt left in bed at end of treatment.  Tyshell Ramberg M,PT Acute Rehab Services [phone]

## 2022-01-29 NOTE — Progress Notes (Signed)
Occupational Therapy Treatment  OT called in to assist PT and rehab tech w/ back to bed transfer from bariatric wheelchair due to difficulty with only +2 assist. Attempted sliding board back to bed and AP transfer with +3 assist though unable to successfully and safely complete. PT planned to assist pt back to bed with tenor lift once returned to ED.   01/29/22 1400  OT Visit Information  Last OT Received On 01/29/22  Assistance Needed +3 or more (for OOB transfers)  History of Present Illness Pt is a 57 y.o. female admitted from SNF rehab on 11/12/21 with back pain and x4 month and intermittent L leg pain, weakness, and numbness. Pt has been at [facility] for rehab since June secondary to chronic back pain s/p L tib fib fx (BLE WBAT). PMH includes OA, back pain, hx of DVT, edema, factor V leiden mutation, GERD, anxiety, hypokalemia, insomnia, intrinsic asthma, lipoma, migraine, morbid obesity, tachycardia.  Precautions  Precautions Fall  Precaution Comments bladder/bowel incontinence  Restrictions  Weight Bearing Restrictions No  Pain Assessment  Pain Assessment Faces  Faces Pain Scale 2  Pain Location BLE  Pain Descriptors / Indicators Grimacing;Guarding  Pain Intervention(s) Monitored during session;Limited activity within patient's tolerance  Cognition  Arousal/Alertness Awake/alert  Behavior During Therapy WFL for tasks assessed/performed  Overall Cognitive Status Within Functional Limits for tasks assessed  General Comments hyperverbose, decreased insight into deficits  Upper Extremity Assessment  Upper Extremity Assessment Overall WFL for tasks assessed  Lower Extremity Assessment  Lower Extremity Assessment Defer to PT evaluation  Vision- Assessment  Vision Assessment? No apparent visual deficits  ADL  Overall ADL's  Needs assistance/impaired  General ADL Comments continues to require extensive assist for LB ADLs  Bed Mobility  General bed mobility comments received in  wheelchair with PT and rehab tech  Transfers  Overall transfer level Needs assistance  General transfer comment Entering to assist PT and rehab tech with transfer back to bed due to difficulty with +2 assist. Attempted sliding board back to bed from bari wheelchair though pt not sliding/fully onto board well enough (likely limited by skin contact) and unable to safely transfer back to bed with +2 assist. Positioned wheelchair against bed with BLE up on bed to attempt AP transfer with OT assisting to lift trunk upwards to lift bottom and scoot with +2 assist with LEs/bottom though pt unable to clear bottom in this manner. Planned to use tenor lift to assist with back to bed once brought back onto unit with OT exiting session  Balance  Overall balance assessment Needs assistance  Sitting-balance support Feet supported;No upper extremity supported  Sitting balance-[name] Good  OT - End of Session  Activity Tolerance Patient tolerated treatment well  Patient left Other (comment) (in wheelchair with PT and rehab tech present)  Nurse Communication Mobility status  OT Assessment/Plan  OT Plan Discharge plan remains appropriate  OT Visit Diagnosis Other abnormalities of gait and mobility (R26.89);Muscle weakness (generalized) (M62.81);Pain  Pain - Right/Left Left  Pain - part of body Knee  OT Frequency (ACUTE ONLY) Min 1X/week  Follow Up Recommendations Skilled nursing-short term rehab (<3 hours/day)  Assistance recommended at discharge Frequent or constant Supervision/Assistance  Patient can return home with the following Two people to help with walking and/or transfers;Two people to help with bathing/dressing/bathroom  [facility] bed;Wheelchair cushion (measurements OT);Wheelchair (measurements OT);Other (comment) (hoyer lift)  AM-PAC OT "6 Clicks" Daily Activity Outcome Measure (Version 2)  Help from another person  eating meals? 4  Help from another person taking care of personal  grooming? 3  Help from another person toileting, which includes using toliet, bedpan, or urinal? 1  Help from another person bathing (including washing, rinsing, drying)? 2  Help from another person to put on and taking off regular upper body clothing? 3  Help from another person to put on and taking off regular lower body clothing? 1  6 Click Score 14  Progressive Mobility  What is the highest level of mobility based on the progressive mobility assessment? Level 2 (Chairfast) - Balance while sitting on edge of bed and cannot stand  OT Goal Progression  Progress towards OT goals OT to reassess next treatment  Acute Rehab OT Goals  Patient Stated Goal improve transfers  OT Goal Formulation With patient  Time For Goal Achievement 02/03/22  Potential to Achieve Goals Good  ADL Goals  Pt Will Transfer to Toilet with mod assist;with +2 assist;stand pivot transfer;squat pivot transfer;bedside commode  Additional ADL Goal #1 Pt will be S in and OOB for basic ADLs with lateral scoot with or without transfer board  Additional ADL Goal #2 Pt will be able to stand at beside with min A for >1 to A for LB ADLs  Additional ADL Goal #3 NA  OT Time Calculation  OT Start Time (ACUTE ONLY) 1136  OT Stop Time (ACUTE ONLY) 1147  OT Time Calculation (min) 11 min  OT General Charges  $OT Visit 1 Visit  OT Treatments  $Therapeutic Activity 8-22 mins

## 2022-01-29 NOTE — ED Notes (Signed)
Cleaned patient up changed pads placed new ones patient is resting with call bell in reach

## 2022-01-29 NOTE — ED Notes (Signed)
Pharmacy contacted about missing warfarin doses.  No new orders at this time.

## 2022-01-30 DIAGNOSIS — M545 Low back pain, unspecified: Secondary | ICD-10-CM | POA: Diagnosis not present

## 2022-01-30 LAB — URINALYSIS, ROUTINE W REFLEX MICROSCOPIC
Bilirubin Urine: NEGATIVE
Glucose, UA: NEGATIVE mg/dL
Hgb urine dipstick: NEGATIVE
Ketones, ur: NEGATIVE mg/dL
Nitrite: POSITIVE — AB
Protein, ur: NEGATIVE mg/dL
Specific Gravity, Urine: 1.014 (ref 1.005–1.030)
WBC, UA: >50 WBC/hpf — ABNORMAL HIGH (ref 0–5)
pH: 5 (ref 5.0–8.0)

## 2022-01-30 MED ORDER — NITROFURANTOIN MONOHYD MACRO 100 MG PO CAPS
100.0000 mg | ORAL_CAPSULE | Freq: Two times a day (BID) | ORAL | Status: AC
  Administered 2022-01-31 – 2022-02-04 (×10): 100 mg via ORAL
  Filled 2022-01-30 (×12): qty 1

## 2022-01-30 MED ORDER — NITROFURANTOIN MONOHYD MACRO 100 MG PO CAPS
100.0000 mg | ORAL_CAPSULE | Freq: Once | ORAL | Status: AC
  Administered 2022-01-30: 100 mg via ORAL
  Filled 2022-01-30: qty 1

## 2022-01-30 NOTE — ED Provider Notes (Addendum)
Emergency Medicine Observation Re-evaluation Note  Beth Marshall is a 57 y.o. female, seen on rounds today.  Pt initially presented to the ED for complaints of Back Pain Currently, the patient is lying in bed, complaining of new dysuria and continued right sided abdominal discomfort.  Physical Exam  BP 123/63 (BP Location: Right Wrist)   Pulse 75   Temp 97.9 F (36.6 C) (Oral)   Resp 17   Ht '5\' 6"'$  (1.676 m)   Wt (!) 186 kg   SpO2 100%   BMI 66.18 kg/m  Physical Exam General: pleasant Cardiac: RRR Lungs: no distress Psych: calm  ED Course / MDM  EKG:   I have reviewed the labs performed to date as well as medications administered while in observation.    Plan  Chart review shows patient had a reported rectus sheath hematoma.  She is complaining of continued abdominal discomfort with possible worse bruising in the right side of the abdomen.  She is anticoagulated on warfarin.  Last INR was 4 days ago.  She is also complaining of new dysuria.  Will plan for UA and INR for further evaluation.  Vitals are otherwise stable.    Urinalysis shows UTI, urine culture will be sent.  Macrobid has been ordered.  PT/INR still pending.    Lorelle Gibbs, DO 01/30/22 1805    Lorelle Gibbs, DO 01/30/22 2134

## 2022-01-30 NOTE — ED Notes (Signed)
Patient requesting female to clean her and changed incontinent pads. Patient informed will try to find female to perform cleaning and changing and if not available this nurse will perform when she needs. Patient verbalize understanding and agrees with the plan.

## 2022-01-30 NOTE — ED Notes (Signed)
Patient has been cleaned, dried, and repositioned, urine collected.

## 2022-01-30 NOTE — ED Notes (Signed)
Patient has been bathed, dried, fresh linen provided, repositioned and a new purewick in place.

## 2022-01-31 DIAGNOSIS — M545 Low back pain, unspecified: Secondary | ICD-10-CM | POA: Diagnosis not present

## 2022-01-31 LAB — PROTIME-INR
INR: 2.3 — ABNORMAL HIGH (ref 0.8–1.2)
Prothrombin Time: 24.7 seconds — ABNORMAL HIGH (ref 11.4–15.2)

## 2022-01-31 NOTE — ED Provider Notes (Signed)
Emergency Medicine Observation Re-evaluation Note  Beth Marshall is a 57 y.o. female, seen on rounds today.  Pt initially presented to the ED for complaints of Back Pain Currently, the patient is resting.  Physical Exam  BP 118/61 (BP Location: Right Wrist)   Pulse 63   Temp 98.5 F (36.9 C) (Oral)   Resp 20   Ht '5\' 6"'$  (1.676 m)   Wt (!) 186 kg   SpO2 100%   BMI 66.18 kg/m  Physical Exam General: calm Cardiac: normal rate Lungs: easy respirations Psych: calm  ED Course / MDM  EKG:   I have reviewed the labs performed to date as well as medications administered while in observation.  Recent changes in the last 24 hours include new UTI, on abx.  Plan  Current plan is for continued abx for UTI. Repeat INR continues to be therapeutic. She is awaiting placement.    Lorelle Gibbs, DO 01/31/22 1128

## 2022-01-31 NOTE — ED Notes (Signed)
Went to obtain VS and patient finally asleep.

## 2022-02-01 DIAGNOSIS — M545 Low back pain, unspecified: Secondary | ICD-10-CM | POA: Diagnosis not present

## 2022-02-01 LAB — URINE CULTURE: Culture: >=100000 — AB

## 2022-02-01 NOTE — Progress Notes (Signed)
Physical Therapy Treatment Patient Details Name: Beth Marshall MRN: [id number] DOB: September 27, 1965 Today's Date: 02/01/2022   History of Present Illness Pt is a 57 y.o. female admitted from SNF rehab on 11/12/21 with back pain and x4 month and intermittent L leg pain, weakness, and numbness. Pt has been at [facility] for [facility] since June secondary to chronic back pain s/p L tib fib fx (BLE WBAT). PMH includes OA, back pain, hx of DVT, edema, factor V leiden mutation, GERD, anxiety, hypokalemia, insomnia, intrinsic asthma, lipoma, migraine, morbid obesity, tachycardia.    PT Comments    Pt admitted with above diagnosis. Pt continues to not be able to stand to RW.  Several attempts were unsuccessful. Pt laid back down and PT assisted pt with exercise for UE and LE.  Messaged SW regarding pts progress and limitations PT is facing in progression of pt.  Will continue PT as able.  Pt currently with functional limitations due to balance and endurance deficits. Pt will benefit from skilled PT to increase their independence and safety with mobility to allow discharge to the venue listed below.      Recommendations for follow up therapy are one component of a multi-disciplinary discharge planning process, led by the attending physician.  Recommendations may be updated based on patient status, additional functional criteria and insurance authorization.  Follow Up Recommendations  Skilled nursing-short term rehab (<3 hours/day) Can patient physically be transported by private vehicle: No   Assistance Recommended at Discharge Frequent or constant Supervision/Assistance  Patient can return home with the following Two people to help with walking and/or transfers;A lot of help with bathing/dressing/bathroom;Assistance with cooking/housework;Assist for transportation;Help with stairs or ramp for entrance   Equipment Recommendations  Other (comment) (defer to SNF)    Recommendations for Other Services OT  consult     Precautions / Restrictions Precautions Precautions: Fall Precaution Comments: bladder/bowel incontinence Restrictions Weight Bearing Restrictions: No LLE Weight Bearing: Weight bearing as tolerated Other Position/Activity Restrictions: confirmed WBAT BLE per Dr. Nechama Guard 11/14/21     Mobility  Bed Mobility Overal bed mobility: Needs Assistance Bed Mobility: Rolling Rolling: Supervision Sidelying to sit: Supervision (with rail)   Sit to supine: Min assist (for left LE, tried to use leg lifter)   General bed mobility comments: Pt was able to sit up with supervision using momentum.    Transfers Overall transfer level: Needs assistance Equipment used: Rolling walker (2 wheels) Transfers: Sit to/from Stand Sit to Stand: +2 physical assistance, +2 safety/equipment, From elevated surface, Total assist           General transfer comment: Attempted to stand pt to weigh on regular scale and pt tried at least 3 x and cannot clear buttocks off bed with 2 person assist.    Ambulation/Gait               General Gait Details: unable   Stairs             Wheelchair Mobility    Modified Rankin (Stroke Patients Only)       Balance Overall balance assessment: Needs assistance Sitting-balance support: Feet supported, No upper extremity supported Sitting balance-[name]: Good Sitting balance - Comments: Able to sit EOB without UE support   Standing balance support: Bilateral upper extremity supported, During functional activity Standing balance-[name]: Zero Standing balance comment: Pt could not clear buttocks off bed today even with 2 person assist.  Cognition Arousal/Alertness: Awake/alert Behavior During Therapy: WFL for tasks assessed/performed Overall Cognitive Status: Within Functional Limits for tasks assessed                                 General Comments: hyperverbose,  decreased insight into deficits        Exercises General Exercises - Upper Extremity Shoulder Flexion: Strengthening, Both, 20 reps, Theraband Theraband Level (Shoulder Flexion): Level 4 (Blue) Shoulder ABduction: Strengthening, Both, Theraband Theraband Level (Shoulder Abduction): Level 4 (Blue) Shoulder Horizontal ABduction: Strengthening, Both, Theraband, 20 reps Theraband Level (Shoulder Horizontal Abduction): Level 4 (Blue) Shoulder Horizontal ADduction: AROM, Both, Standing, Other reps (comment) (30 reps) Elbow Flexion: Strengthening, Both, 20 reps, Theraband Theraband Level (Elbow Flexion): Level 4 (Blue) Elbow Extension: Both, Strengthening, 20 reps, Theraband Theraband Level (Elbow Extension): Level 4 (Blue) General Exercises - Lower Extremity Ankle Circles/Pumps: AROM, 10 reps, Supine, Both Quad Sets: AROM, Both, 5 reps, Seated Gluteal Sets: AROM, Both, 10 reps, Seated Long Arc Quad: AROM, Both, 20 reps, Seated (with min manual resistance) Hip ABduction/ADduction: AROM, 10 reps, Both, Supine (with theraband)    General Comments        Pertinent Vitals/Pain Pain Assessment Pain Assessment: No/denies pain    Home Living                          Prior Function            PT Goals (current goals can now be found in the care plan section) Progress towards PT goals: Progressing toward goals    Frequency    Min 2X/week      PT Plan Current plan remains appropriate    Co-evaluation              AM-PAC PT "6 Clicks" Mobility   Outcome Measure  Help needed turning from your back to your side while in a flat bed without using bedrails?: A Little Help needed moving from lying on your back to sitting on the side of a flat bed without using bedrails?: A Lot Help needed moving to and from a bed to a chair (including a wheelchair)?: Total Help needed standing up from a chair using your arms (e.g., wheelchair or bedside chair)?: Total Help needed  to walk in hospital room?: Total Help needed climbing 3-5 steps with a railing? : Total 6 Click Score: 9    End of Session Equipment Utilized During Treatment: Gait belt Activity Tolerance: Patient limited by fatigue Patient left: with call bell/phone within reach;in bed;with family/visitor present Nurse Communication: Mobility status;Need for lift equipment PT Visit Diagnosis: Muscle weakness (generalized) (M62.81);Difficulty in walking, not elsewhere classified (R26.2)     Time: 4034-7425 PT Time Calculation (min) (ACUTE ONLY): 51 min  Charges:  $Therapeutic Exercise: 8-22 mins $Therapeutic Activity: 23-37 mins                     [facility] M,PT Acute Rehab Services [phone]    Alvira Philips 02/01/2022, 3:27 PM

## 2022-02-01 NOTE — ED Provider Notes (Signed)
Emergency Medicine Observation Re-evaluation Note  Beth Marshall is a 57 y.o. female, seen on rounds today.  Pt initially presented to the ED for complaints of Back Pain Currently, the patient is resting.  Physical Exam  BP 118/61 (BP Location: Right Wrist)   Pulse 63   Temp 98.5 F (36.9 C) (Oral)   Resp 20   Ht '5\' 6"'$  (1.676 m)   Wt (!) 186 kg   SpO2 100%   BMI 66.18 kg/m  Physical Exam General: calm Cardiac: normal rate Lungs: easy respirations Psych: calm  ED Course / MDM  EKG:   I have reviewed the labs performed to date as well as medications administered while in observation.  Recent changes in the last 24 hours include new UTI, on abx.  Plan  Current plan is for continued abx for UTI. Repeat INR continues to be therapeutic, checked on 1/14. She is awaiting placement.       Regan Lemming, MD 02/01/22 [phone]

## 2022-02-01 NOTE — ED Notes (Signed)
Pt has soiled the bed 3x; each time requiring linen change. Peri care provided, pt clean/dry at this time.

## 2022-02-01 NOTE — Progress Notes (Signed)
Patient will need to get her weight checked this week to determine progress for potential SNF placement.

## 2022-02-01 NOTE — Progress Notes (Signed)
[facility] for Warfarin Indication:  paroxysmal A-fib and recurrent DVT history of factor V Leiden mutation and protein S deficiency  Allergies  Allergen Reactions   Cephalexin Other (See Comments)    Unknown reaction per MAR    Chocolate Other (See Comments)    Unknown reaction per MAR   Codeine Nausea And Vomiting   Diphenhydramine Hcl Itching   Ranitidine Rash    Patient Measurements: Height: '5\' 6"'$  (167.6 cm) Weight: (!) 186 kg (410 lb) IBW/kg (Calculated) : 59.3  Vital Signs:    Labs: Recent Labs    01/31/22 0100  LABPROT 24.7*  INR 2.3*     CrCl cannot be calculated (Patient's most recent lab result is older than the maximum 21 days allowed.).  Assessment: 57 yo F with a history of factor V leiden, Afib and protein S deficiency to continue on warfarin.  Patient is morbidly obese, bedbound and has recent left tib/fib.   PTA dosing: 8.'5mg'$  daily except for '8mg'$  on Tue/Thur  INR remains therapeutic and stable on current regimen. Weekly INR - yesterday 2.3. CBC stable on last check on 1/04.  Goal of Therapy:  INR goal 2-3 Monitor platelets by anticoagulation protocol: Yes   Plan:  Continue warfarin '10mg'$  PO daily Weekly INR, monitor s/s bleeding  Erskine Speed, PharmD Clinical Pharmacist 02/01/2022 8:46 AM

## 2022-02-01 NOTE — Progress Notes (Signed)
Patient weighed 431 lbs today. CSW sent an email to [facility] leadership about plan of care and concerns for patients progress.

## 2022-02-01 NOTE — ED Provider Notes (Signed)
Emergency Medicine Observation Re-evaluation Note  Beth Marshall is a 57 y.o. female, seen on rounds today.  Pt initially presented to the ED for complaints of Back Pain Currently, the patient is resting.  Physical Exam  BP 118/61 (BP Location: Right Wrist)   Pulse 63   Temp 98.5 F (36.9 C) (Oral)   Resp 20   Ht '5\' 6"'$  (1.676 m)   Wt (!) 186 kg   SpO2 100%   BMI 66.18 kg/m  Physical Exam General: NAD Cardiac: well perfused Lungs: even and unlabored Psych: calm  ED Course / MDM  EKG:   I have reviewed the labs performed to date as well as medications administered while in observation.  Recent changes in the last 24 hours include new UTI, on abx.  Plan  Current plan is for continued abx for UTI. Repeat INR continues to be therapeutic. She is awaiting placement.       Regan Lemming, MD 02/01/22 [phone]

## 2022-02-02 DIAGNOSIS — M545 Low back pain, unspecified: Secondary | ICD-10-CM | POA: Diagnosis not present

## 2022-02-02 NOTE — ED Notes (Signed)
Pt in room A&O x4. Updated on plan of care. Pt consumed 100% of breakfast. Educated pt on the importance of providing self care. Pt was not receptive to nurse stating that she needs to start doing more in regards to her care. Further teaching needed.

## 2022-02-02 NOTE — ED Notes (Signed)
Writer rounded on patient. Pt A&Ox4, calm and cooperative and in no acute distress. Pt denies any needs at this time.

## 2022-02-02 NOTE — ED Notes (Signed)
Pt in room watching TV. Reports she did her exercises with her band that OT provided. Asked pt about living with family and she reports she can't live with here sister because there is 4 people living there including her wheelchair bound father who is supposed to come visit today. Pt encourage to wipe self. Adjusted in bed for better angle.

## 2022-02-02 NOTE — ED Provider Notes (Signed)
Emergency Medicine Observation Re-evaluation Note  Beth Marshall is a 57 y.o. female, seen on rounds today.  Pt initially presented to the ED for complaints of Back Pain Pt with hx chronic back pain. Also w class III obesity with physical deconditioning and limited mobility due to same - pt has been boarding in ED awaiting placement.  Physical Exam  BP (!) 140/59 (BP Location: Right Arm)   Pulse 70   Temp (!) 97.5 F (36.4 C) (Oral)   Resp 20   Ht 1.676 m ('5\' 6"'$ )   Wt (!) 195.5 kg   SpO2 100%   BMI 69.57 kg/m  Physical Exam General: no distress. Cardiac: regular rate  Lungs: breathing comfortably.   ED Course / MDM   I have reviewed the labs performed to date as well as medications administered while in observation.  Recent changes in the last 24 hours include ED obs, PT, dietary, TOC placement.   Plan  Current plan is for TOC placement.     Lajean Saver, MD 02/02/22 [phone]

## 2022-02-02 NOTE — ED Notes (Signed)
Patient denies pain and is resting comfortably.  

## 2022-02-02 NOTE — ED Notes (Signed)
Peri care performed, purewick changed, linens changed.

## 2022-02-03 DIAGNOSIS — M545 Low back pain, unspecified: Secondary | ICD-10-CM | POA: Diagnosis not present

## 2022-02-03 NOTE — Progress Notes (Signed)
Physical Therapy Treatment Patient Details Name: Beth Marshall MRN: [id number] DOB: 1965/04/28 Today's Date: 02/03/2022   History of Present Illness Pt is a 57 y.o. female admitted from SNF rehab on 11/12/21 with back pain and x4 month and intermittent L leg pain, weakness, and numbness. Pt has been at [facility] for rehab since June secondary to chronic back pain s/p L tib fib fx (BLE WBAT). PMH includes OA, back pain, hx of DVT, edema, factor V leiden mutation, GERD, anxiety, hypokalemia, insomnia, intrinsic asthma, lipoma, migraine, morbid obesity, tachycardia.    PT Comments    Pt admitted with above diagnosis. Pt was able to stand x 4 x to [name] with +2 min to mod assist for safety.  Pt feels more secure with bil Elbow support.  Continues to progress slowly.  0/4 goals met however pt has made progress toward transfer and standing goal. Revised goals and had a discussion with pt regarding PT and OT expectations as well as her progress.  Also discussed with pt what level that PT/OT feels she can feasibly get to which is most likely transfers to wheelchair and a lift may still need to be used for pt.  Pt currently with functional limitations due to balance and endruance deficits. Pt will benefit from skilled PT to increase their independence and safety with mobility to allow discharge to the venue listed below.      Recommendations for follow up therapy are one component of a multi-disciplinary discharge planning process, led by the attending physician.  Recommendations may be updated based on patient status, additional functional criteria and insurance authorization.  Follow Up Recommendations  Skilled nursing-short term rehab (<3 hours/day) Can patient physically be transported by private vehicle: No   Assistance Recommended at Discharge Frequent or constant Supervision/Assistance  Patient can return home with the following Two people to help with walking and/or transfers;A lot of  help with bathing/dressing/bathroom;Assistance with cooking/housework;Assist for transportation;Help with stairs or ramp for entrance   Equipment Recommendations  Other (comment) (defer to SNF)    Recommendations for Other Services       Precautions / Restrictions Precautions Precautions: Fall Precaution Comments: bladder/bowel incontinence Restrictions Weight Bearing Restrictions: No LLE Weight Bearing: Weight bearing as tolerated Other Position/Activity Restrictions: confirmed WBAT BLE per Dr. Nechama Guard 11/14/21     Mobility  Bed Mobility Overal bed mobility: Needs Assistance Bed Mobility: Rolling, Supine to Sit, Sit to Supine Rolling: Modified independent (Device/Increase time) Sidelying to sit: Supervision (with rail) Supine to sit: Supervision, HOB elevated Sit to supine: Max assist (+3)   General bed mobility comments: Supervision with use of bedrail to sit EOB, MAx A x 3 to return to supine w/ heavy assist with BLE back to bed and scooting hips to prevent sliding    Transfers Overall transfer level: Needs assistance Equipment used:  [name]) Transfers: Sit to/from Stand Sit to Stand: Min assist, Mod assist, +2 physical assistance, +2 safety/equipment           General transfer comment: Min A x 2 to Mod A x 2 for standing with [name], able to stand approx 20 sec with 4 various attempts, cues for posture and adjustment of [name] height to improve posture    Ambulation/Gait               General Gait Details: unable   Chief Strategy Officer    Modified Rankin (Stroke  Patients Only)       Balance Overall balance assessment: Needs assistance Sitting-balance support: Feet supported, No upper extremity supported Sitting balance-[name]: Good Sitting balance - Comments: Able to sit EOB without UE support   Standing balance support: Bilateral upper extremity supported, During functional activity Standing  balance-[name]: Poor Standing balance comment: relies on [name] for UE support                            Cognition Arousal/Alertness: Awake/alert Behavior During Therapy: WFL for tasks assessed/performed Overall Cognitive Status: Within Functional Limits for tasks assessed                                 General Comments: hyperverbose, decreased insight into deficits        Exercises Other Exercises Other Exercises: weight shifting with [name]. Could not pick up feet however    General Comments        Pertinent Vitals/Pain Pain Assessment Pain Assessment: Faces Faces Pain Scale: Hurts a little bit Pain Location: LLE Pain Descriptors / Indicators: Grimacing, Guarding Pain Intervention(s): Limited activity within patient's tolerance, Monitored during session, Premedicated before session, Repositioned    Home Living                          Prior Function            PT Goals (current goals can now be found in the care plan section) Acute Rehab PT Goals Patient Stated Goal: to go to a new SNF for rehab then home PT Goal Formulation: With patient Time For Goal Achievement: 02/17/22 Potential to Achieve Goals: Fair Progress towards PT goals: Progressing toward goals    Frequency    Min 2X/week      PT Plan Current plan remains appropriate    Co-evaluation PT/OT/SLP Co-Evaluation/Treatment: Yes Reason for Co-Treatment: Complexity of the patient's impairments (multi-system involvement);For patient/therapist safety PT goals addressed during session: Mobility/safety with mobility OT goals addressed during session: ADL's and self-care;Strengthening/ROM      AM-PAC PT "6 Clicks" Mobility   Outcome Measure  Help needed turning from your back to your side while in a flat bed without using bedrails?: A Little Help needed moving from lying on your back to sitting on the side of a flat bed without using bedrails?: A  Lot Help needed moving to and from a bed to a chair (including a wheelchair)?: Total Help needed standing up from a chair using your arms (e.g., wheelchair or bedside chair)?: Total Help needed to walk in hospital room?: Total Help needed climbing 3-5 steps with a railing? : Total 6 Click Score: 9    End of Session Equipment Utilized During Treatment: Gait belt Activity Tolerance: Patient limited by fatigue Patient left: with call bell/phone within reach;in bed Nurse Communication: Mobility status;Need for lift equipment PT Visit Diagnosis: Muscle weakness (generalized) (M62.81);Difficulty in walking, not elsewhere classified (R26.2)     Time: 7902-4097 PT Time Calculation (min) (ACUTE ONLY): 45 min  Charges:  $Therapeutic Activity: 8-22 mins                     [facility] M,PT Acute Rehab Services [place] 02/03/2022, 1:51 PM

## 2022-02-03 NOTE — ED Notes (Signed)
Patient reports losing vision briefly prior to working with physical therapy today and returned to normal. This RN requested for unit secretary to page provider. Awaiting response

## 2022-02-03 NOTE — NC FL2 (Signed)
[place] LEVEL OF CARE FORM     IDENTIFICATION  Patient Name: Beth Marshall Birthdate: 1965/09/19 Sex: female Admission Date (Current Location): 11/13/2021  [place] and [place] Number:  Kathleen Argue [id number] Ethan and Address:  [facility] [place], [place] [address], [place], [place] [address]      Provider Number: [id number]  Attending Physician Name and Address:  Default, Provider, MD  Relative Name and Phone Number:  Leverne Humbles, [phone]    Current Level of Care: Hospital Recommended Level of Care: [place] Prior Approval Number:    Date Approved/Denied:   PASRR Number: Pending  Discharge Plan: SNF    Current Diagnoses: Patient Active Problem List   Diagnosis Date Noted   Rectus sheath hematoma, initial encounter 06/23/2021   Acute postoperative anemia due to expected blood loss 06/22/2021   Vitamin D deficiency 06/22/2021   Right foot sprain, initial encounter 06/21/2021   Ankle fracture 06/19/2021   Tibia/fibula fracture, left, closed, initial encounter 06/19/2021   Fracture of lower extremity 06/19/2021   Atrial fibrillation ([place]) 05/06/2021   Moderate episode of recurrent major depressive disorder ([place]) 08/02/2018   History of DVT (deep vein thrombosis) 03/15/2018   Primary osteoarthritis of both knees 08/30/2017   Arthritis of right knee 12/20/2016   Chronic deep vein thrombosis (DVT) of lower extremity ([place]) 10/27/2015   Encounter for screening mammogram for breast cancer 10/27/2015   Factor V Leiden mutation ([place]) 10/27/2015   High risk medication use 07/25/2015   Migraine without aura and without status migrainosus, not intractable 06/25/2015   Hypokalemia 05/16/2015   Primary osteoarthritis of left knee 04/22/2015   Elevated blood pressure reading 03/20/2015   Long term (current) use of anticoagulants 03/20/2015   Edema of extremities 02/18/2015   Dizziness 02/18/2015   Back pain  02/18/2015   Abnormal coagulation profile 02/18/2015   Generalized anxiety disorder 02/18/2015   Gastroesophageal reflux disease 02/18/2015   Lesion of ovary 02/18/2015   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   Insomnia 02/18/2015   Impaired fasting glucose 02/18/2015   OSA (obstructive sleep apnea) 02/18/2015   Obesity, morbid, BMI 50 or higher ([place]) 02/18/2015   Lipoma 02/18/2015   Shortness of breath 02/18/2015   Protein S deficiency ([place]) 02/18/2015   Osteoarthritis 02/18/2015   Calculus of gallbladder with cholecystitis and obstruction 06/23/2014    Orientation RESPIRATION BLADDER Height & Weight     Self, Time, Situation, Place  Normal Incontinent Weight: (!) 431 lb (195.5 kg) Height:  '5\' 6"'$  (167.6 cm)  BEHAVIORAL SYMPTOMS/MOOD NEUROLOGICAL BOWEL NUTRITION STATUS      Incontinent  (Heart Healthy/Carb Modified)  AMBULATORY STATUS COMMUNICATION OF NEEDS Skin   Total Care Verbally Normal                       Personal Care Assistance Level of Assistance  Bathing, Feeding, Dressing Bathing Assistance: Maximum assistance Feeding assistance: Maximum assistance Dressing Assistance: Maximum assistance Total Care Assistance: Maximum assistance   Functional Limitations Info  Sight, Hearing, Speech Sight Info: Adequate Hearing Info: Adequate Speech Info: Adequate    SPECIAL CARE FACTORS FREQUENCY  PT (By licensed PT), OT (By licensed OT)     PT Frequency: Min 2X week OT Frequency: Min 1X week            Contractures Contractures Info: Not present    Additional Factors Info  Code Status Code Status Info: Full Allergies Info: Cephalexin Codeine Diphenhydramine Hcl Ranitidine  Current Medications (02/03/2022):  This is the current hospital active medication list Current Facility-Administered Medications  Medication Dose Route Frequency Provider Last Rate Last Admin   acetaminophen (TYLENOL) tablet 650 mg  650 mg Oral Q6H PRN Elgie Congo, MD   650  mg at 12/22/21 2002   albuterol (VENTOLIN HFA) 108 (90 Base) MCG/ACT inhaler 2 puff  2 puff Inhalation Q4H PRN Domenic Moras, PA-C   2 puff at 01/13/22 2312   cyclobenzaprine (FLEXERIL) tablet 5 mg  5 mg Oral TID PRN Elgie Congo, MD   5 mg at 01/31/22 1106   diclofenac Sodium (VOLTAREN) 1 % topical gel 2 g  2 g Topical Q8H PRN Georgina Snell C, MD   2 g at 01/07/22 2032   furosemide (LASIX) tablet 40 mg  40 mg Oral Daily Elgie Congo, MD   40 mg at 02/03/22 7322   hydrocortisone cream 1 %   Topical BID Elgie Congo, MD   Given at 02/03/22 0935   loratadine (CLARITIN) tablet 10 mg  10 mg Oral Daily Georgina Snell C, MD   10 mg at 02/03/22 0933   LORazepam (ATIVAN) tablet 1 mg  1 mg Oral G2R PRN Delora Fuel, MD   1 mg at 01/28/22 0855   melatonin tablet 3 mg  3 mg Oral Daily PRN Elgie Congo, MD   3 mg at 02/02/22 2229   menthol-cetylpyridinium (CEPACOL) lozenge 3 mg  1 lozenge Oral PRN Leanord Asal K, DO   3 mg at 01/11/22 1741   mometasone-formoterol (DULERA) 200-5 MCG/ACT inhaler 2 puff  2 puff Inhalation BID Georgina Snell C, MD   2 puff at 02/03/22 0935   montelukast (SINGULAIR) tablet 10 mg  10 mg Oral QHS Bell, Lorin C, RPH   10 mg at 02/02/22 2229   nitrofurantoin (macrocrystal-monohydrate) (MACROBID) capsule 100 mg  100 mg Oral Q12H Horton, Kristie M, DO   100 mg at 02/03/22 4270   nystatin (MYCOSTATIN/NYSTOP) topical powder   Topical PRN Carmin Muskrat, MD   Given at 01/31/22 1100   oxyCODONE-acetaminophen (PERCOCET/ROXICET) 5-325 MG per tablet 1-2 tablet  1-2 tablet Oral Q4H PRN Elgie Congo, MD   2 tablet at 02/03/22 0934   pantoprazole (PROTONIX) EC tablet 40 mg  40 mg Oral Daily Georgina Snell C, MD   40 mg at 02/03/22 0933   polyethylene glycol (MIRALAX / GLYCOLAX) packet 17 g  17 g Oral Daily PRN Georgina Snell C, MD       potassium chloride (KLOR-CON M) CR tablet 10 mEq  10 mEq Oral BID Georgina Snell C, MD   10 mEq at  02/03/22 0933   venlafaxine XR (EFFEXOR-XR) 24 hr capsule 75 mg  75 mg Oral Q breakfast Elgie Congo, MD   75 mg at 02/03/22 0933   Vitamin D (Ergocalciferol) (DRISDOL) 1.25 MG (50000 UNIT) capsule 50,000 Units  50,000 Units Oral Q7 days Elgie Congo, MD   50,000 Units at 01/30/22 1659   warfarin (COUMADIN) tablet 10 mg  10 mg Oral q1600 Louanne Belton, RPH   10 mg at 02/02/22 1556   Warfarin - Pharmacist Dosing Inpatient   Does not apply q1600 Tegeler, Gwenyth Allegra, MD   Given at 02/02/22 1558   Current Outpatient Medications  Medication Sig Dispense Refill   acetaminophen (TYLENOL) 650 MG CR tablet Take 650 mg by mouth every 6 (six) hours as needed for pain.     albuterol (PROVENTIL HFA;VENTOLIN HFA) 108 (  90 BASE) MCG/ACT inhaler Inhale 1 puff into the lungs every 6 (six) hours as needed for wheezing or shortness of breath.     BELSOMRA 10 MG TABS Take 10 mg by mouth daily as needed (for insomnia).     budesonide-formoterol (SYMBICORT) 160-4.5 MCG/ACT inhaler Inhale 2 puffs into the lungs.     calcipotriene (DOVONOX) 0.005 % cream Apply 1 Application topically 2 (two) times daily. Apply to affected areas twice a day for psoriasis     cetirizine (ZYRTEC) 10 MG tablet Take 10 mg by mouth.     cyclobenzaprine (FLEXERIL) 5 MG tablet Take 5 mg by mouth 3 (three) times daily as needed for muscle spasms.     diclofenac Sodium (VOLTAREN) 1 % GEL Apply 2 g topically every 8 (eight) hours as needed (for pain).     furosemide (LASIX) 40 MG tablet Take 40 mg by mouth.     montelukast (SINGULAIR) 10 MG tablet Take 10 mg by mouth daily at 12 noon.     oxyCODONE-acetaminophen (PERCOCET/ROXICET) 5-325 MG tablet Take 1-2 tablets by mouth every 4 (four) hours as needed for severe pain (1 tablet moderate pain, 2 tablets severe pain). 60 tablet 0   pantoprazole (PROTONIX) 40 MG tablet Take 1 tablet (40 mg total) by mouth daily. 30 tablet 0   polyethylene glycol (MIRALAX / GLYCOLAX) 17 g packet  Take 17 g by mouth daily as needed for mild constipation. 14 each 0   Potassium Chloride 10 MEQ PACK Take 10 mEq by mouth 2 (two) times daily.     SUMAtriptan (IMITREX) 100 MG tablet Take 1 tablet by mouth every 2 (two) hours as needed for migraine.     venlafaxine XR (EFFEXOR-XR) 75 MG 24 hr capsule Take 75 mg by mouth daily with breakfast.     Vitamin D, Ergocalciferol, (DRISDOL) 1.25 MG (50000 UNIT) CAPS capsule Take 1 capsule (50,000 Units total) by mouth every 7 (seven) days. (Patient taking differently: Take 50,000 Units by mouth every 7 (seven) days. Every Saturday) 5 capsule 0   warfarin (COUMADIN) 1 MG tablet Take 1 mg by mouth See admin instructions. Take with 7.'5mg'$  tablet for a total dose of 8.'5mg'$  once every Sun, Mon, Wed, Fri, Sat     warfarin (COUMADIN) 4 MG tablet Take 8 mg by mouth 2 (two) times a week. Take 2 tablets by mouth once every Tuesday and Thursday     warfarin (COUMADIN) 7.5 MG tablet Take 7.5 mg by mouth See admin instructions. Take with '1mg'$  tablet for a total dose of 8.'5mg'$  once every Sun, Mon, Wed, Fri, Sat       Discharge Medications: Please see discharge summary for a list of discharge medications.  Relevant Imaging Results:  Relevant Lab Results:   Additional Information SSN: [id number]  Raina Mina, LCSWA

## 2022-02-03 NOTE — Progress Notes (Signed)
Patient accepted at [facility] rehab for SNF placement. CSW started insurance authorization and requested PASRR.

## 2022-02-03 NOTE — Progress Notes (Addendum)
Occupational Therapy Treatment Patient Details Name: Beth Marshall MRN: [id number] DOB: 03-11-1965 Today's Date: 02/03/2022   History of present illness Pt is a 57 y.o. female admitted from SNF rehab on 11/12/21 with back pain and x4 month and intermittent L leg pain, weakness, and numbness. Pt has been at [facility] for rehab since June secondary to chronic back pain s/p L tib fib fx (BLE WBAT). PMH includes OA, back pain, hx of DVT, edema, factor V leiden mutation, GERD, anxiety, hypokalemia, insomnia, intrinsic asthma, lipoma, migraine, morbid obesity, tachycardia.   OT comments  Pt with overall inconsistent progress towards OT goals though demonstrated progress during today's session. Focus on standing with [name] x 4 trials with Min-Mod A x 2. Pt able to minimally weight shift but unable to lift feet. Based on inconsistent functional abiltities and safety concerns with transfers, initiated discussion with pt regarding plateau in abilities, home w/ bed level assist vs facility stay, and realistic goals. With this discussion, pt was noted with sudden reports of vision loss that resolved within 5 min, no other associated symptoms. Educated pt that her goal of walking likely not appropriate as transfers still difficult and that bed level ADL assist likely new baseline. Plan to assess ADLs and compensatory strategies for these in next session with pt agreeable.   Recommendations for follow up therapy are one component of a multi-disciplinary discharge planning process, led by the attending physician.  Recommendations may be updated based on patient status, additional functional criteria and insurance authorization.    Follow Up Recommendations  Skilled nursing-short term rehab (<3 hours/day) (unless family able to provide consistent bed level support for ADLs and hoyer lift for transfers)     Assistance Recommended at Discharge Frequent or constant Supervision/Assistance  Patient can  return home with the following  Two people to help with walking and/or transfers;Two people to help with bathing/dressing/bathroom   Equipment Recommendations  Hospital bed;Wheelchair cushion (measurements OT);Wheelchair (measurements OT);Other (comment) (hoyer lift)    Recommendations for Other Services      Precautions / Restrictions Precautions Precautions: Fall Precaution Comments: bladder/bowel incontinence Restrictions Weight Bearing Restrictions: No       Mobility Bed Mobility Overal bed mobility: Needs Assistance Bed Mobility: Rolling, Supine to Sit, Sit to Supine Rolling: Modified independent (Device/Increase time)   Supine to sit: Supervision, HOB elevated Sit to supine: Max assist (+3)   General bed mobility comments: Supervision with use of bedrail to sit EOB, MAx A x 3 to return to supine w/ heavy assist with BLE back to bed and scooting hips to prevent sliding    Transfers Overall transfer level: Needs assistance Equipment used:  [name]) Transfers: Sit to/from Stand Sit to Stand: Min assist, Mod assist, +2 physical assistance, +2 safety/equipment           General transfer comment: Min A x 2 to Mod A x 2 for standing with [name], able to stand approx 20 sec with 4 various attempts, cues for posture and adjustment of [name] height to improve posture     Balance Overall balance assessment: Needs assistance Sitting-balance support: Feet supported, No upper extremity supported Sitting balance-[name]: Good     Standing balance support: Bilateral upper extremity supported, During functional activity Standing balance-[name]: Poor                             ADL either performed or assessed with clinical  judgement   ADL Overall ADL's : Needs assistance/impaired                     Lower Body Dressing: Total assistance;Bed level Lower Body Dressing Details (indicate cue type and reason): for slippers        Toileting - Clothing Manipulation Details (indicate cue type and reason): noted incontinence BM bed level, nursing aware       General ADL Comments: Focus on standing w/ [name] to address transfer potential, readdressed DC plans, home vs facility placement and what assist needed as pt likely unable to progress standing transfers and sliding board transfers not safe with limited assist    Extremity/Trunk Assessment Upper Extremity Assessment Upper Extremity Assessment: Overall WFL for tasks assessed   Lower Extremity Assessment Lower Extremity Assessment: Defer to PT evaluation        Vision   Vision Assessment?: No apparent visual deficits   Perception     Praxis      Cognition Arousal/Alertness: Awake/alert Behavior During Therapy: WFL for tasks assessed/performed Overall Cognitive Status: Within Functional Limits for tasks assessed                                 General Comments: hyperverbose, decreased insight into deficits        Exercises      Shoulder Instructions       General Comments      Pertinent Vitals/ Pain       Pain Assessment Pain Assessment: Faces Faces Pain Scale: Hurts a little bit Pain Location: LLE Pain Descriptors / Indicators: Grimacing, Guarding Pain Intervention(s): Monitored during session  Home Living                                          Prior Functioning/Environment              Frequency  Min 1X/week        Progress Toward Goals  OT Goals(current goals can now be found in the care plan section)  Progress towards OT goals: Progressing toward goals  Acute Rehab OT Goals Patient Stated Goal: walk OT Goal Formulation: With patient Time For Goal Achievement: 02/17/22 Potential to Achieve Goals: Good  Plan Discharge plan remains appropriate    Co-evaluation    PT/OT/SLP Co-Evaluation/Treatment: Yes Reason for Co-Treatment: To address functional/ADL transfers;For  patient/therapist safety;Other (comment) (progress standing, readress DC plan and acute goals)   OT goals addressed during session: ADL's and self-care;Strengthening/ROM      AM-PAC OT "6 Clicks" Daily Activity     Outcome Measure   Help from another person eating meals?: None Help from another person taking care of personal grooming?: A Little Help from another person toileting, which includes using toliet, bedpan, or urinal?: Total Help from another person bathing (including washing, rinsing, drying)?: A Lot Help from another person to put on and taking off regular upper body clothing?: A Little Help from another person to put on and taking off regular lower body clothing?: Total 6 Click Score: 14    End of Session Equipment Utilized During Treatment: Gait belt  OT Visit Diagnosis: Other abnormalities of gait and mobility (R26.89);Muscle weakness (generalized) (M62.81);Pain Pain - Right/Left: Left Pain - part of body: Knee   Activity Tolerance Patient tolerated treatment well  Patient Left in bed;with call bell/phone within reach   Nurse Communication Mobility status        Time: 1126-1210 OT Time Calculation (min): 44 min  Charges: OT General Charges $OT Visit: 1 Visit OT Treatments $Therapeutic Activity: 23-37 mins  Malachy Chamber, OTR/L Acute Rehab Services Office: [phone]   Layla Maw 02/03/2022, 12:56 PM

## 2022-02-03 NOTE — ED Notes (Signed)
Pt called out reporting purewick had been dislodged and pt had BM. Pts linens changed, peri care performed, soiled purewick replaced

## 2022-02-03 NOTE — ED Provider Notes (Signed)
Emergency Medicine Observation Re-evaluation Note  Beth Marshall is a 57 y.o. female, seen on rounds today.  Pt initially presented to the ED for complaints of Back Pain Pt with hx chronic back pain. Also w class III obesity with physical deconditioning and limited mobility due to same - pt has been boarding in ED awaiting placement.  Physical Exam  BP (!) 140/59 (BP Location: Right Arm)   Pulse 70   Temp 97.9 F (36.6 C) (Oral)   Resp 20   Ht '5\' 6"'$  (1.676 m)   Wt (!) 195.5 kg   SpO2 100%   BMI 69.57 kg/m  Physical Exam General: no distress. Cardiac: Well perfused Lungs: breathing comfortably. Psych: No distress   ED Course / MDM   I have reviewed the labs performed to date as well as medications administered while in observation.  Recent changes in the last 24 hours include ED obs.  Plan  Current plan is for TOC placement.      Regan Lemming, MD 02/03/22 [phone]

## 2022-02-03 NOTE — ED Notes (Signed)
Patient is soiled in urine. Patient linen changed into new ones and and clean chux provided.

## 2022-02-03 NOTE — Progress Notes (Signed)
Per [facility] leadership CSW sent SNF referrals to [facility] and [facility].

## 2022-02-03 NOTE — Progress Notes (Signed)
TOC CSW notified pt of her acceptance to [facility] for SNF placement.  Pt is in agreeance.  TOC is currently awaiting PASRR and insurance auth.  Bonetta Mostek Tarpley-Carter, MSW, LCSW-A Pronouns:  She/Her/Hers [facility] Care Clinical Social Worker Direct Number:  [phone] [email or web address]

## 2022-02-04 DIAGNOSIS — M545 Low back pain, unspecified: Secondary | ICD-10-CM | POA: Diagnosis not present

## 2022-02-04 NOTE — Progress Notes (Signed)
TOC CSW received a call from [place].  CSW was given information for Peer to Peer call.  The call must take place Friday (02/05/2022) by 12pm.  Peer to Peer #:  [phone], option 5.  Rewa Weissberg Tarpley-Carter, MSW, LCSW-A Pronouns:  She/Her/Hers [facility] Clinical Social Worker Direct Number:  [phone] [email or web address]

## 2022-02-04 NOTE — ED Notes (Signed)
Patient requests med pass at a later time around 2230-2300.

## 2022-02-04 NOTE — Progress Notes (Signed)
Pt note Beth Marshall, [place] clinical support rep tilted pt with pt tolerating 65 degrees for 11 minutes.  Pt in good spirits.   Beth Marshall M,PT Acute Rehab Services [phone]

## 2022-02-04 NOTE — Progress Notes (Signed)
PASRR request approved ([id number]). CSW is still waiting on a decision from patients insurance company.

## 2022-02-04 NOTE — ED Provider Notes (Signed)
Emergency Medicine Observation Re-evaluation Note  Beth Marshall is a 57 y.o. female, seen on rounds today.  Pt initially presented to the ED for complaints of Back Pain The patient has been holding in the emergency room for over 1000 hours for possible placement patient has morbid obesity.  She is bedbound.  Pt is eating her meal this am.  In the midst of prayers.  Physical Exam  BP 132/67 (BP Location: Right Wrist)   Pulse 63   Temp 97.6 F (36.4 C) (Oral)   Resp 18   Ht 1.676 m ('5\' 6"'$ )   Wt (!) 195.5 kg   SpO2 99%   BMI 69.57 kg/m  Physical Exam General: No acute distress Cardiac: normal rate Lungs: normal effort Psych:   ED Course / MDM  EKG:   I have reviewed the labs performed to date as well as medications administered while in observation.  Recent changes in the last 24 hours include .  Continued attempts by [facility] for placement  Plan  Current plan is for SNF placement.    Dorie Rank, MD 02/04/22 1309

## 2022-02-04 NOTE — ED Notes (Signed)
Pt has a family member at bedside. Pt's family is helping her wash up and change her sheets and gown. This RN replaced a new purwick and will continue to monitor.

## 2022-02-04 NOTE — ED Notes (Signed)
Patient bed sheet and bed pads changed. Patient denies any other needs at this time.

## 2022-02-04 NOTE — Progress Notes (Signed)
CSW received a call from [place] one of the nurses from [employer]. Patients insurance authorization is being sent to the medical director for review. CSW was told there could be a possible peer to peer today. Langley Gauss stated she would call back once the medical director makes a decision.

## 2022-02-04 NOTE — Progress Notes (Signed)
Insurance authorization and PASRR request is still pending.

## 2022-02-05 DIAGNOSIS — M545 Low back pain, unspecified: Secondary | ICD-10-CM | POA: Diagnosis not present

## 2022-02-05 LAB — PROTIME-INR
INR: 3.2 — ABNORMAL HIGH (ref 0.8–1.2)
Prothrombin Time: 32.3 s — ABNORMAL HIGH (ref 11.4–15.2)

## 2022-02-05 MED ORDER — OXYCODONE-ACETAMINOPHEN 5-325 MG PO TABS: 1.0000 | ORAL_TABLET | Freq: Four times a day (QID) | ORAL | 0 refills | Status: AC | PRN

## 2022-02-05 NOTE — Progress Notes (Signed)
TOC CSW spoke with [facility] [phone].  Archie Patten gave call report information.  This information will also be shared with RN.   Room#:  100A  Call Report#:  [phone]  Reynolds Kittel Tarpley-Carter, MSW, LCSW-A Pronouns:  She/Her/Hers [facility] Health[facility]itions of Care Clinical Social Worker Direct Number:  [phone] [email or web address]

## 2022-02-05 NOTE — Progress Notes (Signed)
[facility] for Warfarin Indication:  paroxysmal A-fib and recurrent DVT history of factor V Leiden mutation and protein S deficiency  Allergies  Allergen Reactions   Cephalexin Other (See Comments)    Unknown reaction per MAR    Chocolate Other (See Comments)    Unknown reaction per MAR   Codeine Nausea And Vomiting   Diphenhydramine Hcl Itching   Ranitidine Rash    Patient Measurements: Height: '5\' 6"'$  (167.6 cm) Weight: (!) 195.5 kg (431 lb) IBW/kg (Calculated) : 59.3  Vital Signs:    Labs: Recent Labs    02/04/22 2343  LABPROT 32.3*  INR 3.2*     CrCl cannot be calculated (Patient's most recent lab result is older than the maximum 21 days allowed.).  Assessment: 57 yo F with a history of factor V leiden, Afib and protein S deficiency to continue on warfarin.  Patient is morbidly obese, bedbound and has recent left tib/fib.   PTA dosing: 8.'5mg'$  daily except for '8mg'$  on Tue/Thur  INR supratherapeutic yesterday at 3.2 - has been stable on '10mg'$  daily for several weeks. No signs of bleeding.  Goal of Therapy:  INR goal 2-3 Monitor platelets by anticoagulation protocol: Yes   Plan:  Hold warfarin dose tonight as INR is supratherapeutic Recheck INR and CBC on 1/20  Erskine Speed, PharmD Clinical Pharmacist 02/05/2022 7:01 AM

## 2022-02-05 NOTE — ED Provider Notes (Addendum)
Emergency Medicine Observation Re-evaluation Note  Beth Marshall is a 57 y.o. female, seen on rounds today.  Pt initially presented to the ED for complaints of Back Pain Currently, the patient is resting.  Physical Exam  BP 132/67 (BP Location: Right Wrist)   Pulse 63   Temp 97.6 F (36.4 C) (Oral)   Resp 18   Ht '5\' 6"'$  (1.676 m)   Wt (!) 195.5 kg   SpO2 99%   BMI 69.57 kg/m  Physical Exam Pulmonary:     Effort: Pulmonary effort is normal.  Neurological:     Mental Status: She is alert.      ED Course / MDM  EKG:   I have reviewed the labs performed to date as well as medications administered while in observation.  Recent changes in the last 24 hours include nothing.  Plan  Current plan is for TOC placement.  Peer to peer with her with her health insurance.  She has been denied short-term rehab facility.  They will approve intermittent therapy her long term care facility which she was living in prior to her most recent rehab stay.  Pt to be discharged to facility per CM  Lennice Sites, DO 02/05/22 0810    Lennice Sites, DO 02/05/22 4174    Lennice Sites, DO 02/05/22 1251

## 2022-02-05 NOTE — Progress Notes (Signed)
Physical Therapy Treatment Patient Details Name: Beth Marshall MRN: [id number] DOB: 1965/07/11 Today's Date: 02/05/2022   History of Present Illness Pt is a 57 y.o. female admitted from SNF rehab on 11/12/21 with back pain and x4 month and intermittent L leg pain, weakness, and numbness. Pt has been at [facility] for rehab since June secondary to chronic back pain s/p L tib fib fx (BLE WBAT). PMH includes OA, back pain, hx of DVT, edema, factor V leiden mutation, GERD, anxiety, hypokalemia, insomnia, intrinsic asthma, lipoma, migraine, morbid obesity, tachycardia.    PT Comments    Pt admitted with above diagnosis. Pt again stood 4 x to [employer] and incr time from 20-30 seconds each attempt.  Will continue to progress pt as pt tolerates.  Pt currently with functional limitations due to balance and endurance deficits. Pt will benefit from skilled PT to increase their independence and safety with mobility to allow discharge to the venue listed below.      Recommendations for follow up therapy are one component of a multi-disciplinary discharge planning process, led by the attending physician.  Recommendations may be updated based on patient status, additional functional criteria and insurance authorization.  Follow Up Recommendations  Skilled nursing-short term rehab (<3 hours/day) Can patient physically be transported by private vehicle: No   Assistance Recommended at Discharge Frequent or constant Supervision/Assistance  Patient can return home with the following Two people to help with walking and/or transfers;A lot of help with bathing/dressing/bathroom;Assistance with cooking/housework;Assist for transportation;Help with stairs or ramp for entrance   Equipment Recommendations  Other (comment) (defer to SNF)    Recommendations for Other Services OT consult     Precautions / Restrictions Precautions Precautions: Fall Precaution Comments: bladder/bowel  incontinence Restrictions Weight Bearing Restrictions: No LLE Weight Bearing: Weight bearing as tolerated Other Position/Activity Restrictions: confirmed WBAT BLE per Dr. Nechama Guard 11/14/21     Mobility  Bed Mobility Overal bed mobility: Needs Assistance Bed Mobility: Rolling, Supine to Sit, Sit to Supine Rolling: Modified independent (Device/Increase time) Sidelying to sit: Supervision (with rail) Supine to sit: Supervision, HOB elevated Sit to supine: Max assist (+2)   General bed mobility comments: Supervision with use of bedrail to sit EOB, MAx A x 2 to return to supine w/ heavy assist with BLE back to bed and scooting hips to prevent sliding    Transfers Overall transfer level: Needs assistance Equipment used:  [name]) Transfers: Sit to/from Stand Sit to Stand: Min assist, Mod assist, +2 physical assistance, +2 safety/equipment           General transfer comment: Min A x 2 to Mod A x 2 for standing with [name], able to stand 20-30  sec with 4  attempts, cues for posture and adjustment of [name] height to improve posture    Ambulation/Gait               General Gait Details: unable   Chief Strategy Officer    Modified Rankin (Stroke Patients Only)       Balance Overall balance assessment: Needs assistance Sitting-balance support: Feet supported, No upper extremity supported Sitting balance-[name]: Good Sitting balance - Comments: Able to sit EOB without UE support   Standing balance support: Bilateral upper extremity supported, During functional activity Standing balance-[name]: Poor Standing balance comment: relies on [name] for UE support  Cognition Arousal/Alertness: Awake/alert Behavior During Therapy: WFL for tasks assessed/performed Overall Cognitive Status: Within Functional Limits for tasks assessed                                 General  Comments: hyperverbose, decreased insight into deficits        Exercises General Exercises - Lower Extremity Quad Sets: AROM, Both, 5 reps, Seated Other Exercises Other Exercises: weight shifting with [name]. Could not pick up feet however    General Comments        Pertinent Vitals/Pain Pain Assessment Pain Assessment: Faces Faces Pain Scale: Hurts even more Pain Location: LLE and right knee Pain Descriptors / Indicators: Grimacing, Guarding Pain Intervention(s): Limited activity within patient's tolerance, Monitored during session, Repositioned, Premedicated before session    Home Living                          Prior Function            PT Goals (current goals can now be found in the care plan section) Acute Rehab PT Goals Patient Stated Goal: to go to a new SNF for rehab then home Progress towards PT goals: Progressing toward goals    Frequency    Min 2X/week      PT Plan Current plan remains appropriate    Co-evaluation              AM-PAC PT "6 Clicks" Mobility   Outcome Measure  Help needed turning from your back to your side while in a flat bed without using bedrails?: A Little Help needed moving from lying on your back to sitting on the side of a flat bed without using bedrails?: A Lot Help needed moving to and from a bed to a chair (including a wheelchair)?: Total Help needed standing up from a chair using your arms (e.g., wheelchair or bedside chair)?: Total Help needed to walk in hospital room?: Total Help needed climbing 3-5 steps with a railing? : Total 6 Click Score: 9    End of Session Equipment Utilized During Treatment: Gait belt Activity Tolerance: Patient limited by fatigue Patient left: with call bell/phone within reach;in bed Nurse Communication: Mobility status;Need for lift equipment PT Visit Diagnosis: Muscle weakness (generalized) (M62.81);Difficulty in walking, not elsewhere classified (R26.2)     Time:  1001-1035 PT Time Calculation (min) (ACUTE ONLY): 34 min  Charges:  $Therapeutic Exercise: 8-22 mins $Therapeutic Activity: 8-22 mins                     [facility] M,PT Acute Rehab Services [phone]    Alvira Philips 02/05/2022, 12:24 PM

## 2022-02-05 NOTE — ED Notes (Signed)
Pt verified all belonging are in her custody

## 2022-02-05 NOTE — ED Notes (Signed)
Patient cleaned up at this time.

## 2022-02-05 NOTE — Progress Notes (Addendum)
This CSW attempted to call Arbie Cookey at [employer] to inform of Denial after peer to peer. Left HIPAA Compliant voicemail. It was reported that Arbie Cookey will be in a meeting until 11 AM.   This CSW spoke with Archie Patten and [place] requests hard script for pain medications in the event she has to order from a specialty pharmacy. Once LOG is complete, TOC will contact [facility] for transport. RNCM and EDP aware.   [facility] called.

## 2022-02-05 NOTE — Discharge Planning (Signed)
RNCM coordinating with EDSW for discharge.  Pt in need of peer review for SNF placement consideration. RNCM secured EDP to complete and authorization was DENIED. Approval decision communicated with EDSW. TOC continues to work on discharge plan.

## 2023-02-23 ENCOUNTER — Other Ambulatory Visit: Payer: Self-pay

## 2023-02-23 ENCOUNTER — Emergency Department (HOSPITAL_COMMUNITY): Payer: 59

## 2023-02-23 ENCOUNTER — Emergency Department (HOSPITAL_COMMUNITY)
Admission: EM | Admit: 2023-02-23 | Discharge: 2023-02-24 | Disposition: A | Discharge status: Home / Self Care | Payer: 59 | Attending: Emergency Medicine | Admitting: Emergency Medicine

## 2023-02-23 ENCOUNTER — Encounter (HOSPITAL_COMMUNITY): Payer: Self-pay | Admitting: Emergency Medicine

## 2023-02-23 DIAGNOSIS — R051 Acute cough: Secondary | ICD-10-CM | POA: Insufficient documentation

## 2023-02-23 DIAGNOSIS — R059 Cough, unspecified: Secondary | ICD-10-CM | POA: Diagnosis present

## 2023-02-23 DIAGNOSIS — I10 Essential (primary) hypertension: Secondary | ICD-10-CM | POA: Insufficient documentation

## 2023-02-23 DIAGNOSIS — Z7901 Long term (current) use of anticoagulants: Secondary | ICD-10-CM | POA: Diagnosis not present

## 2023-02-23 DIAGNOSIS — J45909 Unspecified asthma, uncomplicated: Secondary | ICD-10-CM | POA: Insufficient documentation

## 2023-02-23 DIAGNOSIS — E876 Hypokalemia: Secondary | ICD-10-CM

## 2023-02-23 DIAGNOSIS — Z20822 Contact with and (suspected) exposure to covid-19: Secondary | ICD-10-CM | POA: Insufficient documentation

## 2023-02-23 LAB — CBC WITH DIFFERENTIAL/PLATELET
Abs Immature Granulocytes: 0.04 10*3/uL (ref 0.00–0.07)
Basophils Absolute: 0.1 10*3/uL (ref 0.0–0.1)
Basophils Relative: 1 %
Eosinophils Absolute: 0.1 10*3/uL (ref 0.0–0.5)
Eosinophils Relative: 1 %
HCT: 39.8 % (ref 36.0–46.0)
Hemoglobin: 12.4 g/dL (ref 12.0–15.0)
Immature Granulocytes: 0 %
Lymphocytes Relative: 26 %
Lymphs Abs: 2.7 10*3/uL (ref 0.7–4.0)
MCH: 27.6 pg (ref 26.0–34.0)
MCHC: 31.2 g/dL (ref 30.0–36.0)
MCV: 88.6 fL (ref 80.0–100.0)
Monocytes Absolute: 0.9 10*3/uL (ref 0.1–1.0)
Monocytes Relative: 9 %
Neutro Abs: 6.6 10*3/uL (ref 1.7–7.7)
Neutrophils Relative %: 63 %
Platelets: 221 10*3/uL (ref 150–400)
RBC: 4.49 MIL/uL (ref 3.87–5.11)
RDW: 16.7 % — ABNORMAL HIGH (ref 11.5–15.5)
WBC: 10.4 10*3/uL (ref 4.0–10.5)
nRBC: 0 % (ref 0.0–0.2)

## 2023-02-23 LAB — PROTIME-INR
INR: 2.1 — ABNORMAL HIGH (ref 0.8–1.2)
Prothrombin Time: 23.9 s — ABNORMAL HIGH (ref 11.4–15.2)

## 2023-02-23 LAB — MAGNESIUM: Magnesium: 2 mg/dL (ref 1.7–2.4)

## 2023-02-23 LAB — COMPREHENSIVE METABOLIC PANEL
ALT: 17 U/L (ref 0–44)
AST: 19 U/L (ref 15–41)
Albumin: 3.3 g/dL — ABNORMAL LOW (ref 3.5–5.0)
Alkaline Phosphatase: 61 U/L (ref 38–126)
Anion gap: 14 (ref 5–15)
BUN: 10 mg/dL (ref 6–20)
CO2: 33 mmol/L — ABNORMAL HIGH (ref 22–32)
Calcium: 9.4 mg/dL (ref 8.9–10.3)
Chloride: 88 mmol/L — ABNORMAL LOW (ref 98–111)
Creatinine, Ser: 0.63 mg/dL (ref 0.44–1.00)
GFR, Estimated: >60 mL/min (ref >60)
Glucose, Bld: 107 mg/dL — ABNORMAL HIGH (ref 70–99)
Potassium: 2.5 mmol/L — CL (ref 3.5–5.1)
Sodium: 135 mmol/L (ref 135–145)
Total Bilirubin: 0.6 mg/dL (ref 0.0–1.2)
Total Protein: 7 g/dL (ref 6.5–8.1)

## 2023-02-23 LAB — GROUP A STREP BY PCR: Group A Strep by PCR: NOT DETECTED

## 2023-02-23 LAB — RESP PANEL BY RT-PCR (RSV, FLU A&B, COVID)  RVPGX2
Influenza A by PCR: NEGATIVE
Influenza B by PCR: NEGATIVE
Resp Syncytial Virus by PCR: NEGATIVE
SARS Coronavirus 2 by RT PCR: NEGATIVE

## 2023-02-23 MED ORDER — BENZONATATE 100 MG PO CAPS
200.0000 mg | ORAL_CAPSULE | Freq: Once | ORAL | Status: AC
  Administered 2023-02-23: 200 mg via ORAL
  Filled 2023-02-23: qty 2

## 2023-02-23 MED ORDER — POTASSIUM CHLORIDE CRYS ER 20 MEQ PO TBCR
40.0000 meq | EXTENDED_RELEASE_TABLET | Freq: Once | ORAL | Status: AC
  Administered 2023-02-23: 40 meq via ORAL
  Filled 2023-02-23: qty 2

## 2023-02-23 MED ORDER — ACETAMINOPHEN 500 MG PO TABS
1000.0000 mg | ORAL_TABLET | Freq: Once | ORAL | Status: AC
  Administered 2023-02-23: 1000 mg via ORAL
  Filled 2023-02-23: qty 2

## 2023-02-23 MED ORDER — POTASSIUM CHLORIDE 10 MEQ/100ML IV SOLN
10.0000 meq | INTRAVENOUS | Status: AC
  Administered 2023-02-23: 10 meq via INTRAVENOUS
  Filled 2023-02-23: qty 100

## 2023-02-23 MED ORDER — POTASSIUM CHLORIDE 10 MEQ/100ML IV SOLN
10.0000 meq | Freq: Once | INTRAVENOUS | Status: AC
  Administered 2023-02-23: 10 meq via INTRAVENOUS
  Filled 2023-02-23: qty 100

## 2023-02-23 MED ORDER — POTASSIUM CHLORIDE CRYS ER 20 MEQ PO TBCR: 20.0000 meq | EXTENDED_RELEASE_TABLET | Freq: Two times a day (BID) | ORAL | 0 refills | Status: AC

## 2023-02-23 NOTE — ED Provider Notes (Signed)
  Physical Exam  BP 99/60   Pulse 83   Temp 98.4 F (36.9 C)   Resp 13   Ht 5' 6 (1.676 m)   Wt (!) 196 kg   SpO2 99%   BMI 69.74 kg/m   Physical Exam  Procedures  Procedures  ED Course / MDM   Clinical Course as of 02/23/23 2251  Wed Feb 23, 2023  1622 Chest x-ray negative  I, Ozell Marine DO, am transitioning care of this patient to the oncoming provider pending remainder of laboratory workup reevaluation and disposition [MP]    Clinical Course User Index [MP] Marine Ozell LABOR, DO   Medical Decision Making Amount and/or Complexity of Data Reviewed Labs: ordered. Radiology: ordered.  Risk OTC drugs. Prescription drug management.   Received in signout.  Has had cough.  Had been seeing ENT but they were unable to get x-ray due to body habitus.  Blood work does show a hypokalemia.  Will supplement both IV and orally.  Magnesium reassuring.  X-ray does not show pneumonia here and negative viral testing.  Appears stable to discharge back to nursing home.       Beth Lot, MD 02/23/23 2251

## 2023-02-23 NOTE — ED Provider Notes (Signed)
 [place] EMERGENCY DEPARTMENT AT [facility] Provider Note   CSN: [id number] Arrival date & time: 02/23/23  1415     History  No chief complaint on file.   Beth Marshall is a 58 y.o. female.  With a history of morbid obesity, factor V Leiden on warfarin, asthma and hypertension who presents to ED for coughing and headache.  Patient is a resident of [facility].  She is experiencing ongoing dry cough, sore throat shortness of breath and a headache for the last month or so.  She was seen as an outpatient by [facility] ENT who was unable to obtain chest x-ray successfully due to the patient's body habitus.  No fevers chills nausea vomiting diarrhea or abdominal pain.  HPI     Home Medications Prior to Admission medications   Medication Sig Start Date End Date Taking? Authorizing Provider  acetaminophen  (TYLENOL ) 650 MG CR tablet Take 650 mg by mouth every 6 (six) hours as needed for pain.    [provider]  albuterol  (PROVENTIL  HFA;VENTOLIN  HFA) 108 (90 BASE) MCG/ACT inhaler Inhale 1 puff into the lungs every 6 (six) hours as needed for wheezing or shortness of breath.    [provider]  BELSOMRA 10 MG TABS Take 10 mg by mouth daily as needed (for insomnia). 06/01/21   [provider]  budesonide-formoterol  (SYMBICORT) 160-4.5 MCG/ACT inhaler Inhale 2 puffs into the lungs.    [provider]  calcipotriene  (DOVONOX) 0.005 % cream Apply 1 Application topically 2 (two) times daily. Apply to affected areas twice a day for psoriasis    [provider]  cetirizine (ZYRTEC) 10 MG tablet Take 10 mg by mouth. 01/16/19   [provider]  cyclobenzaprine  (FLEXERIL ) 5 MG tablet Take 5 mg by mouth 3 (three) times daily as needed for muscle spasms.    [provider]  diclofenac  Sodium (VOLTAREN ) 1 % GEL Apply 2 g topically every 8 (eight) hours as needed (for pain).    [provider]  furosemide   (LASIX ) 40 MG tablet Take 40 mg by mouth.    [provider]  montelukast  (SINGULAIR ) 10 MG tablet Take 10 mg by mouth daily at 12 noon. 10/24/18   [provider]  oxyCODONE -acetaminophen  (PERCOCET/ROXICET) 5-325 MG tablet Take 1-2 tablets by mouth every 4 (four) hours as needed for severe pain (1 tablet moderate pain, 2 tablets severe pain). 06/22/21   Danton Lauraine LABOR, PA-C  oxyCODONE -acetaminophen  (PERCOCET/ROXICET) 5-325 MG tablet Take 1 tablet by mouth every 6 (six) hours as needed for severe pain. 02/05/22   Beverley Leita LABOR, PA-C  pantoprazole  (PROTONIX ) 40 MG tablet Take 1 tablet (40 mg total) by mouth daily. 06/27/21 12/26/21  [place], Camellia PARAS, DO  polyethylene glycol (MIRALAX  / GLYCOLAX ) 17 g packet Take 17 g by mouth daily as needed for mild constipation. 06/26/21   [place], Camellia PARAS, DO  Potassium Chloride  10 MEQ PACK Take 10 mEq by mouth 2 (two) times daily.    [provider]  SUMAtriptan  (IMITREX ) 100 MG tablet Take 1 tablet by mouth every 2 (two) hours as needed for migraine. 07/18/15   [provider]  venlafaxine  XR (EFFEXOR -XR) 75 MG 24 hr capsule Take 75 mg by mouth daily with breakfast. 08/02/18   [provider]  Vitamin D , Ergocalciferol , (DRISDOL ) 1.25 MG (50000 UNIT) CAPS capsule Take 1 capsule (50,000 Units total) by mouth every 7 (seven) days. Patient taking differently: Take 50,000 Units by mouth every  7 (seven) days. Every Saturday 06/22/21   Danton Lauraine LABOR, PA-C  warfarin (COUMADIN ) 1 MG tablet Take 1 mg by mouth See admin instructions. Take with 7.5mg  tablet for a total dose of 8.5mg  once every Sun, Mon, Wed, Fri, Sat    [provider]  warfarin (COUMADIN ) 4 MG tablet Take 8 mg by mouth 2 (two) times a week. Take 2 tablets by mouth once every Tuesday and Thursday    [provider]  warfarin (COUMADIN ) 7.5 MG tablet Take 7.5 mg by mouth See admin instructions. Take with 1mg  tablet for a total dose of 8.5mg  once every Sun,  Mon, Wed, Fri, Sat    [provider]      Allergies    Cephalexin, Chocolate, Codeine, Diphenhydramine hcl, and Ranitidine    Review of Systems   Review of Systems  Physical Exam Updated Vital Signs BP (!) 89/60   Temp 98.2 F (36.8 C) (Oral)   Resp (!) 21   Ht 5' 6 (1.676 m)   Wt (!) 196 kg   BMI 69.74 kg/m  Physical Exam Vitals and nursing note reviewed.  Constitutional:      Comments: Morbidly obese  HENT:     Head: Normocephalic and atraumatic.     Mouth/Throat:     Pharynx: No oropharyngeal exudate or posterior oropharyngeal erythema.  Eyes:     Pupils: Pupils are equal, round, and reactive to light.  Cardiovascular:     Rate and Rhythm: Normal rate and regular rhythm.  Pulmonary:     Effort: Pulmonary effort is normal.     Breath sounds: Normal breath sounds.  Abdominal:     Palpations: Abdomen is soft.     Tenderness: There is no abdominal tenderness.  Skin:    General: Skin is warm and dry.  Neurological:     Mental Status: She is alert.  Psychiatric:        Mood and Affect: Mood normal.     ED Results / Procedures / Treatments   Labs (all labs ordered are listed, but only abnormal results are displayed) Labs Reviewed  RESP PANEL BY RT-PCR (RSV, FLU A&B, COVID)  [id number]  GROUP A STREP BY PCR  COMPREHENSIVE METABOLIC PANEL  CBC WITH DIFFERENTIAL/PLATELET  PROTIME-INR    EKG EKG Interpretation Date/Time:  Wednesday February 23 2023 14:40:58 EST Ventricular Rate:  101 PR Interval:  184 QRS Duration:  121 QT Interval:  392 QTC Calculation: 509 R Axis:   -14  Text Interpretation: Sinus tachycardia Nonspecific intraventricular conduction delay Probable anterior infarct, age indeterminate ST depression V1-V3, suggest recording posterior leads Confirmed by Pamella Sharper [phone]) on 02/23/2023 4:22:17 PM  Radiology DG Chest Portable 1 View Result Date: 02/23/2023 CLINICAL DATA:  Cough, shortness of breath. EXAM: PORTABLE CHEST 1 VIEW  COMPARISON:  November 15, 2022. FINDINGS: Stable cardiomegaly. Both lungs are clear. The visualized skeletal structures are unremarkable. IMPRESSION: No active disease. Electronically Signed   By: Lynwood Landy Raddle M.D.   On: 02/23/2023 15:56    Procedures Procedures    Medications Ordered in ED Medications  acetaminophen  (TYLENOL ) tablet 1,000 mg (has no administration in time range)  benzonatate  (TESSALON ) capsule 200 mg (200 mg Oral Given 02/23/23 1555)    ED Course/ Medical Decision Making/ A&P Clinical Course as of 02/23/23 1622  Wed Feb 23, 2023  1622 Chest x-ray negative  I, Sharper Pamella DO, am transitioning care of this patient to the oncoming provider pending remainder of laboratory workup reevaluation  and disposition [MP]    Clinical Course User Index [MP] Pamella Ozell LABOR, DO                                 Medical Decision Making 58 year old female with history as above presenting for 1 month of dry cough congestion sore throat.  Seen by ENT who was unable to get chest x-ray in the office due to body habitus.  Afebrile and normotensive here on initial assessment.  No adventitious lung sounds on exam.  Cough has been mostly dry.  Differential diagnosis includes viral respiratory illness, pneumonia, bronchitis, pharyngitis and strep throat.  Will obtain COVID/RSV/influenza swab, strep swab, basic laboratory workup, chest x-ray.  Will trial Tessalon  Perles and acetaminophen  to see if this helps with her symptoms.  Amount and/or Complexity of Data Reviewed Labs: ordered. Radiology: ordered.  Risk OTC drugs. Prescription drug management.           Final Clinical Impression(s) / ED Diagnoses Final diagnoses:  Acute cough    Rx / DC Orders ED Discharge Orders     None         Pamella Ozell LABOR, DO 02/23/23 1622

## 2023-02-23 NOTE — ED Notes (Signed)
 Called lab for magnesium add on

## 2023-02-23 NOTE — ED Triage Notes (Signed)
 Pt BIB [facility] from [facility] ENT office due to cough, headache and shortness of breath that started a month ago.  They could not obtain chest xray in office due to her size.  Pt does come from [facility].  VS BP 140/80, HR 100, SpO2 100%, Resp 20

## 2023-02-23 NOTE — ED Notes (Signed)
 Lab called with 2 hemolyzed samples. This RN sent down 2 additional samples

## 2023-02-23 NOTE — Discharge Instructions (Addendum)
 You were seen in the Emergency Department for coughing congestion Your chest x-ray did not show any evidence of pneumonia Your RSV COVID influenza were all negative Your blood work looked okay You remained stable on room air You can continue taking previously prescribed medications Follow-up with your primary doctor within 1 week for reevaluation Return to the emergency department for worsening shortness of breath or any other concerns

## 2023-02-23 NOTE — ED Notes (Signed)
 Contacted phlebotomy for difficult stick.

## 2023-04-06 ENCOUNTER — Emergency Department (HOSPITAL_COMMUNITY)
Admission: EM | Admit: 2023-04-06 | Discharge: 2023-04-06 | Disposition: A | Discharge status: Rehabilitation Facility | Attending: Emergency Medicine | Admitting: Emergency Medicine

## 2023-04-06 ENCOUNTER — Encounter (HOSPITAL_COMMUNITY): Payer: Self-pay

## 2023-04-06 ENCOUNTER — Emergency Department (HOSPITAL_COMMUNITY)

## 2023-04-06 DIAGNOSIS — Z7951 Long term (current) use of inhaled steroids: Secondary | ICD-10-CM | POA: Insufficient documentation

## 2023-04-06 DIAGNOSIS — R1084 Generalized abdominal pain: Secondary | ICD-10-CM | POA: Insufficient documentation

## 2023-04-06 DIAGNOSIS — J029 Acute pharyngitis, unspecified: Secondary | ICD-10-CM | POA: Diagnosis not present

## 2023-04-06 DIAGNOSIS — R1013 Epigastric pain: Secondary | ICD-10-CM | POA: Diagnosis present

## 2023-04-06 DIAGNOSIS — I82509 Chronic embolism and thrombosis of unspecified deep veins of unspecified lower extremity: Secondary | ICD-10-CM | POA: Insufficient documentation

## 2023-04-06 DIAGNOSIS — Z6841 Body Mass Index (BMI) 40.0 and over, adult: Secondary | ICD-10-CM | POA: Diagnosis not present

## 2023-04-06 DIAGNOSIS — D6851 Activated protein C resistance: Secondary | ICD-10-CM | POA: Insufficient documentation

## 2023-04-06 DIAGNOSIS — R112 Nausea with vomiting, unspecified: Secondary | ICD-10-CM | POA: Insufficient documentation

## 2023-04-06 DIAGNOSIS — Z7901 Long term (current) use of anticoagulants: Secondary | ICD-10-CM | POA: Insufficient documentation

## 2023-04-06 DIAGNOSIS — J45909 Unspecified asthma, uncomplicated: Secondary | ICD-10-CM | POA: Diagnosis not present

## 2023-04-06 DIAGNOSIS — N200 Calculus of kidney: Secondary | ICD-10-CM | POA: Insufficient documentation

## 2023-04-06 DIAGNOSIS — Z79899 Other long term (current) drug therapy: Secondary | ICD-10-CM | POA: Insufficient documentation

## 2023-04-06 LAB — CBC WITH DIFFERENTIAL/PLATELET
Abs Immature Granulocytes: 0.03 10*3/uL (ref 0.00–0.07)
Basophils Absolute: 0.1 10*3/uL (ref 0.0–0.1)
Basophils Relative: 1 %
Eosinophils Absolute: 0.2 10*3/uL (ref 0.0–0.5)
Eosinophils Relative: 2 %
HCT: 37 % (ref 36.0–46.0)
Hemoglobin: 11.5 g/dL — ABNORMAL LOW (ref 12.0–15.0)
Immature Granulocytes: 0 %
Lymphocytes Relative: 26 %
Lymphs Abs: 2.5 10*3/uL (ref 0.7–4.0)
MCH: 27.9 pg (ref 26.0–34.0)
MCHC: 31.1 g/dL (ref 30.0–36.0)
MCV: 89.8 fL (ref 80.0–100.0)
Monocytes Absolute: 0.8 10*3/uL (ref 0.1–1.0)
Monocytes Relative: 8 %
Neutro Abs: 6.1 10*3/uL (ref 1.7–7.7)
Neutrophils Relative %: 63 %
Platelets: 285 10*3/uL (ref 150–400)
RBC: 4.12 MIL/uL (ref 3.87–5.11)
RDW: 15.9 % — ABNORMAL HIGH (ref 11.5–15.5)
WBC: 9.7 10*3/uL (ref 4.0–10.5)
nRBC: 0 % (ref 0.0–0.2)

## 2023-04-06 LAB — COMPREHENSIVE METABOLIC PANEL
ALT: 12 U/L (ref 0–44)
AST: 18 U/L (ref 15–41)
Albumin: 3.2 g/dL — ABNORMAL LOW (ref 3.5–5.0)
Alkaline Phosphatase: 52 U/L (ref 38–126)
Anion gap: 5 (ref 5–15)
BUN: 11 mg/dL (ref 6–20)
CO2: 27 mmol/L (ref 22–32)
Calcium: 8.7 mg/dL — ABNORMAL LOW (ref 8.9–10.3)
Chloride: 105 mmol/L (ref 98–111)
Creatinine, Ser: 0.75 mg/dL (ref 0.44–1.00)
GFR, Estimated: >60 mL/min (ref >60)
Glucose, Bld: 94 mg/dL (ref 70–99)
Potassium: 4.2 mmol/L (ref 3.5–5.1)
Sodium: 137 mmol/L (ref 135–145)
Total Bilirubin: 0.6 mg/dL (ref 0.0–1.2)
Total Protein: 6.3 g/dL — ABNORMAL LOW (ref 6.5–8.1)

## 2023-04-06 LAB — LIPASE, BLOOD: Lipase: 23 U/L (ref 11–51)

## 2023-04-06 LAB — RESP PANEL BY RT-PCR (RSV, FLU A&B, COVID)  RVPGX2
Influenza A by PCR: NEGATIVE
Influenza B by PCR: NEGATIVE
Resp Syncytial Virus by PCR: NEGATIVE
SARS Coronavirus 2 by RT PCR: NEGATIVE

## 2023-04-06 MED ORDER — IOHEXOL 350 MG/ML SOLN
100.0000 mL | Freq: Once | INTRAVENOUS | Status: AC | PRN
  Administered 2023-04-06: 100 mL via INTRAVENOUS

## 2023-04-06 MED ORDER — ONDANSETRON 4 MG PO TBDP: 4.0000 mg | ORAL_TABLET | Freq: Three times a day (TID) | ORAL | 0 refills | Status: DC | PRN

## 2023-04-06 MED ORDER — ONDANSETRON 4 MG PO TBDP: 4.0000 mg | ORAL_TABLET | Freq: Three times a day (TID) | ORAL | 0 refills | Status: AC | PRN

## 2023-04-06 MED ORDER — ONDANSETRON HCL 4 MG/2ML IJ SOLN
4.0000 mg | Freq: Once | INTRAMUSCULAR | Status: AC
  Administered 2023-04-06: 4 mg via INTRAVENOUS
  Filled 2023-04-06: qty 2

## 2023-04-06 NOTE — ED Notes (Signed)
[facility] called no eta given

## 2023-04-06 NOTE — ED Triage Notes (Signed)
 Pt BIB [facility] d/t N/V and mid epigastric area since 0200.

## 2023-04-06 NOTE — ED Provider Notes (Addendum)
 [place] EMERGENCY DEPARTMENT AT [facility] Provider Note   CSN: [id number] Arrival date & time: 04/06/23  6295     History  Chief Complaint  Patient presents with   Emesis    Beth Marshall is a 58 y.o. female with history of factor V Leiden mutation, chronic DVT of lower extremities, migraines, morbid obesity with a BMI 50 or higher, GERD, intrinsic asthma.  Patient had cholecystectomy 2016.  Patient presents to ED for evaluation of abdominal pain, nausea and vomiting.  The patient reports that she began having epigastric abdominal pain last night around 1:30 AM.  She reports that around 3 AM she began to have nausea and vomiting.  Reports 15 episodes of nausea and vomiting since onset this morning.  Denies blood in vomit.  Denies fevers, chest pain, shortness of breath.  Denies dysuria, flank pain.  Denies diarrhea.  Reports that COVID is currently going around her nursing home and she is complaining of a sore throat but denies any GI illnesses currently being passed around.  Reports that she began Ozempic on Monday.  Reports last bowel movement was yesterday, normal.   Emesis Associated symptoms: abdominal pain and sore throat   Associated symptoms: no diarrhea and no fever        Home Medications Prior to Admission medications   Medication Sig Start Date End Date Taking? Authorizing Provider  ondansetron (ZOFRAN-ODT) 4 MG disintegrating tablet Take 1 tablet (4 mg total) by mouth every 8 (eight) hours as needed for nausea or vomiting. 04/06/23  Yes Al Decant, PA-C  acetaminophen (TYLENOL) 650 MG CR tablet Take 650 mg by mouth every 6 (six) hours as needed for pain.    [provider]  albuterol (PROVENTIL HFA;VENTOLIN HFA) 108 (90 BASE) MCG/ACT inhaler Inhale 1 puff into the lungs every 6 (six) hours as needed for wheezing or shortness of breath.    [provider]  BELSOMRA 10 MG TABS Take 10 mg by mouth daily as needed (for insomnia).  06/01/21   [provider]  budesonide-formoterol (SYMBICORT) 160-4.5 MCG/ACT inhaler Inhale 2 puffs into the lungs.    [provider]  calcipotriene (DOVONOX) 0.005 % cream Apply 1 Application topically 2 (two) times daily. Apply to affected areas twice a day for psoriasis    [provider]  cetirizine (ZYRTEC) 10 MG tablet Take 10 mg by mouth. 01/16/19   [provider]  cyclobenzaprine (FLEXERIL) 5 MG tablet Take 5 mg by mouth 3 (three) times daily as needed for muscle spasms.    [provider]  diclofenac Sodium (VOLTAREN) 1 % GEL Apply 2 g topically every 8 (eight) hours as needed (for pain).    [provider]  furosemide (LASIX) 40 MG tablet Take 40 mg by mouth.    [provider]  montelukast (SINGULAIR) 10 MG tablet Take 10 mg by mouth daily at 12 noon. 10/24/18   [provider]  oxyCODONE-acetaminophen (PERCOCET/ROXICET) 5-325 MG tablet Take 1-2 tablets by mouth every 4 (four) hours as needed for severe pain (1 tablet moderate pain, 2 tablets severe pain). 06/22/21   [place], PA-C  oxyCODONE-acetaminophen (PERCOCET/ROXICET) 5-325 MG tablet Take 1 tablet by mouth every 6 (six) hours as needed for severe pain. 02/05/22   Jeannie Fend, PA-C  pantoprazole (PROTONIX) 40 MG tablet Take 1 tablet (40 mg total) by mouth daily. 06/27/21 12/26/21  [place], Alvira Philips, DO  polyethylene glycol (MIRALAX / GLYCOLAX) 17 g packet Take  17 g by mouth daily as needed for mild constipation. 06/26/21   [place], Alvira Philips, DO  Potassium Chloride 10 MEQ PACK Take 10 mEq by mouth 2 (two) times daily.    [provider]  potassium chloride SA (KLOR-CON M) 20 MEQ tablet Take 1 tablet (20 mEq total) by mouth 2 (two) times daily. 02/23/23   Benjiman Core, MD  SUMAtriptan (IMITREX) 100 MG tablet Take 1 tablet by mouth every 2 (two) hours as needed for migraine. 07/18/15   [provider]  venlafaxine XR (EFFEXOR-XR) 75 MG 24 hr  capsule Take 75 mg by mouth daily with breakfast. 08/02/18   [provider]  Vitamin D, Ergocalciferol, (DRISDOL) 1.25 MG (50000 UNIT) CAPS capsule Take 1 capsule (50,000 Units total) by mouth every 7 (seven) days. Patient taking differently: Take 50,000 Units by mouth every 7 (seven) days. Every Saturday 06/22/21   [place], PA-C  warfarin (COUMADIN) 1 MG tablet Take 1 mg by mouth See admin instructions. Take with 7.5mg  tablet for a total dose of 8.5mg  once every Sun, Mon, Wed, Fri, Sat    [provider]  warfarin (COUMADIN) 4 MG tablet Take 8 mg by mouth 2 (two) times a week. Take 2 tablets by mouth once every Tuesday and Thursday    [provider]  warfarin (COUMADIN) 7.5 MG tablet Take 7.5 mg by mouth See admin instructions. Take with 1mg  tablet for a total dose of 8.5mg  once every Sun, Mon, Wed, Fri, Sat    [provider]      Allergies    Cephalexin, Chocolate, Codeine, Diphenhydramine hcl, and Ranitidine    Review of Systems   Review of Systems  Constitutional:  Negative for fever.  HENT:  Positive for sore throat.   Gastrointestinal:  Positive for abdominal pain, nausea and vomiting. Negative for diarrhea.  All other systems reviewed and are negative.   Physical Exam Updated Vital Signs BP 122/68   Pulse 68   Temp 98.3 F (36.8 C) (Oral)   Resp 19   Ht 5\' 6"  (1.676 m)   Wt (!) 210 kg   SpO2 99%   BMI 74.73 kg/m  Physical Exam Constitutional:      General: She is not in acute distress.    Appearance: She is obese. She is not ill-appearing, toxic-appearing or diaphoretic.     Comments: Patient in no apparent distress  HENT:     Mouth/Throat:     Mouth: Mucous membranes are moist.  Eyes:     Extraocular Movements: Extraocular movements intact.     Conjunctiva/sclera: Conjunctivae normal.     Pupils: Pupils are equal, round, and reactive to light.  Cardiovascular:     Rate and Rhythm: Normal rate and regular rhythm.   Pulmonary:     Effort: Pulmonary effort is normal.     Breath sounds: Normal breath sounds. No wheezing.  Abdominal:     Palpations: Abdomen is soft.     Tenderness: There is abdominal tenderness.     Comments: Epigastric TTP, no rebound or guarding  Musculoskeletal:     Cervical back: Normal range of motion and neck supple. No tenderness.  Skin:    General: Skin is warm and dry.     Capillary Refill: Capillary refill takes less than 2 seconds.  Neurological:     Mental Status: She is oriented to person, place, and time. Mental status is at baseline.     ED Results / Procedures / Treatments  Labs (all labs ordered are listed, but only abnormal results are displayed) Labs Reviewed  CBC WITH DIFFERENTIAL/PLATELET - Abnormal; Notable for the following components:      Result Value   Hemoglobin 11.5 (*)    RDW 15.9 (*)    All other components within normal limits  COMPREHENSIVE METABOLIC PANEL - Abnormal; Notable for the following components:   Calcium 8.7 (*)    Total Protein 6.3 (*)    Albumin 3.2 (*)    All other components within normal limits  RESP PANEL BY RT-PCR (RSV, FLU A&B, COVID)  [id number]  LIPASE, BLOOD  URINALYSIS, ROUTINE W REFLEX MICROSCOPIC    EKG None  Radiology CT ABDOMEN PELVIS W CONTRAST Result Date: 04/06/2023 CLINICAL DATA:  Abdominal pain. EXAM: CT ABDOMEN AND PELVIS WITH CONTRAST TECHNIQUE: Multidetector CT imaging of the abdomen and pelvis was performed using the standard protocol following bolus administration of intravenous contrast. RADIATION DOSE REDUCTION: This exam was performed according to the departmental dose-optimization program which includes automated exposure control, adjustment of the mA and/or kV according to patient size and/or use of iterative reconstruction technique. CONTRAST:  OMNIPAQUE IOHEXOL 350 MG/ML SOLN COMPARISON:  CT abdomen pelvis dated 08/04/2021. FINDINGS: Lower chest: The visualized lung bases are clear. No  intra-abdominal free air or free fluid. Hepatobiliary: The liver is unremarkable. No biliary dilatation. Cholecystectomy. No retained calcified stone noted in the central CBD. Pancreas: Unremarkable. No pancreatic ductal dilatation or surrounding inflammatory changes. Spleen: Normal in size without focal abnormality. Adrenals/Urinary Tract: The left adrenal glands unremarkable. Indeterminate 2 cm right adrenal nodule. There is a 6 mm nonobstructing right renal upper pole calculus. No hydronephrosis. The left kidney is unremarkable. The visualized ureters and urinary bladder appear unremarkable. Stomach/Bowel: There is no bowel obstruction or active inflammation. The appendix is normal. Vascular/Lymphatic: Mild aortoiliac atherosclerotic disease. The IVC is unremarkable. No portal venous gas. There is no adenopathy. Reproductive: Hysterectomy.  No suspicious adnexal masses. Other: None Musculoskeletal: Osteopenia with degenerative changes of the spine. No acute osseous pathology. IMPRESSION: 1. No acute intra-abdominal or pelvic pathology. 2. A 6 mm nonobstructing right renal upper pole calculus. No hydronephrosis. Electronically Signed   By: Elgie Collard M.D.   On: 04/06/2023 10:49    Procedures Procedures   Medications Ordered in ED Medications  ondansetron (ZOFRAN) injection 4 mg (4 mg Intravenous Given 04/06/23 0655)  iohexol (OMNIPAQUE) 350 MG/ML injection 100 mL (100 mLs Intravenous Contrast Given 04/06/23 0849)    ED Course/ Medical Decision Making/ A&P  Medical Decision Making Amount and/or Complexity of Data Reviewed Radiology: ordered.  Risk Prescription drug management.   58 year old female presents for evaluation.  Please see HPI for further details.  On examination the patient is afebrile and nontachycardic.  Lung sounds are clear bilaterally, she is not hypoxic.  Abdomen has tenderness in the epigastric region.  Neurological examinations at baseline.  Differential diagnosis  includes foodborne illness, norovirus, bowel obstruction, medication side effects  Will collect labs to include CBC, CMP, lipase, urinalysis and viral panel.  Will also assess patient with CT abdomen pelvis with contrast due to focality of tenderness.  Will provide patient with 4 mg of Zofran for nausea.  CBC without leukocytosis.  Metabolic panel without Electra derangement or elevated LFTs.  Lipase within normal limits.  Viral panel negative for all.  CT abdomen pelvis negative for any kind of acute findings.  Patient reevaluated.  After Zofran, patient reports feeling less nauseous.  States she feels better.  Patient  abdominal pain, nausea and vomiting to be secondary to recently starting Ozempic.  Will send her home with Zofran.  Will have her follow-up with her GI team.  Return precautions provided and she voiced understanding.  Stable to discharge.   Final Clinical Impression(s) / ED Diagnoses Final diagnoses:  Generalized abdominal pain  Nausea and vomiting, unspecified vomiting type    Rx / DC Orders ED Discharge Orders          Ordered    ondansetron (ZOFRAN-ODT) 4 MG disintegrating tablet  Every 8 hours PRN        04/06/23 1059                Al Decant, PA-C 04/06/23 1136    Virgina Norfolk, DO 04/06/23 1453

## 2023-04-06 NOTE — Discharge Instructions (Signed)
 It was a pleasure taking part in your care.  As discussed, your workup is reassuring.  CT scan of your abdomen showed no acute findings.  I suspect that your abdominal pain, nausea and vomiting is secondary to beginning Ozempic recently.  I am sending you home with Zofran.  Please take this every 6 hours as needed for nausea and vomiting.  Please follow-up with your PCP.  Return to the ED with any new or worsening symptoms.

## 2023-04-06 NOTE — ED Notes (Signed)
 Report called to Meagan at [facility] for pt discharge. Will contact [facility] for transport

## 2023-05-08 ENCOUNTER — Emergency Department (HOSPITAL_COMMUNITY)

## 2023-05-08 ENCOUNTER — Emergency Department (HOSPITAL_COMMUNITY)
Admission: EM | Admit: 2023-05-08 | Discharge: 2023-05-08 | Disposition: A | Discharge status: Home / Self Care | Attending: Emergency Medicine | Admitting: Emergency Medicine

## 2023-05-08 ENCOUNTER — Other Ambulatory Visit: Payer: Self-pay

## 2023-05-08 ENCOUNTER — Encounter (HOSPITAL_COMMUNITY): Payer: Self-pay

## 2023-05-08 DIAGNOSIS — M25561 Pain in right knee: Secondary | ICD-10-CM | POA: Diagnosis not present

## 2023-05-08 DIAGNOSIS — S0990XA Unspecified injury of head, initial encounter: Secondary | ICD-10-CM | POA: Insufficient documentation

## 2023-05-08 DIAGNOSIS — M25562 Pain in left knee: Secondary | ICD-10-CM | POA: Diagnosis not present

## 2023-05-08 DIAGNOSIS — Z7901 Long term (current) use of anticoagulants: Secondary | ICD-10-CM | POA: Diagnosis not present

## 2023-05-08 DIAGNOSIS — W19XXXA Unspecified fall, initial encounter: Secondary | ICD-10-CM

## 2023-05-08 DIAGNOSIS — M79671 Pain in right foot: Secondary | ICD-10-CM | POA: Insufficient documentation

## 2023-05-08 DIAGNOSIS — Z86718 Personal history of other venous thrombosis and embolism: Secondary | ICD-10-CM | POA: Insufficient documentation

## 2023-05-08 DIAGNOSIS — G8929 Other chronic pain: Secondary | ICD-10-CM

## 2023-05-08 DIAGNOSIS — W06XXXA Fall from bed, initial encounter: Secondary | ICD-10-CM | POA: Diagnosis not present

## 2023-05-08 LAB — CBC WITH DIFFERENTIAL/PLATELET
Abs Immature Granulocytes: 0.07 10*3/uL (ref 0.00–0.07)
Basophils Absolute: 0.1 10*3/uL (ref 0.0–0.1)
Basophils Relative: 1 %
Eosinophils Absolute: 0.2 10*3/uL (ref 0.0–0.5)
Eosinophils Relative: 2 %
HCT: 37.4 % (ref 36.0–46.0)
Hemoglobin: 11.5 g/dL — ABNORMAL LOW (ref 12.0–15.0)
Immature Granulocytes: 1 %
Lymphocytes Relative: 21 %
Lymphs Abs: 2.3 10*3/uL (ref 0.7–4.0)
MCH: 27.6 pg (ref 26.0–34.0)
MCHC: 30.7 g/dL (ref 30.0–36.0)
MCV: 89.7 fL (ref 80.0–100.0)
Monocytes Absolute: 0.7 10*3/uL (ref 0.1–1.0)
Monocytes Relative: 7 %
Neutro Abs: 7.5 10*3/uL (ref 1.7–7.7)
Neutrophils Relative %: 68 %
Platelets: 287 10*3/uL (ref 150–400)
RBC: 4.17 MIL/uL (ref 3.87–5.11)
RDW: 15.7 % — ABNORMAL HIGH (ref 11.5–15.5)
WBC: 10.8 10*3/uL — ABNORMAL HIGH (ref 4.0–10.5)
nRBC: 0 % (ref 0.0–0.2)

## 2023-05-08 LAB — COMPREHENSIVE METABOLIC PANEL WITH GFR
ALT: 13 U/L (ref 0–44)
AST: 13 U/L — ABNORMAL LOW (ref 15–41)
Albumin: 3.1 g/dL — ABNORMAL LOW (ref 3.5–5.0)
Alkaline Phosphatase: 45 U/L (ref 38–126)
Anion gap: 6 (ref 5–15)
BUN: 11 mg/dL (ref 6–20)
CO2: 29 mmol/L (ref 22–32)
Calcium: 9.1 mg/dL (ref 8.9–10.3)
Chloride: 101 mmol/L (ref 98–111)
Creatinine, Ser: 0.61 mg/dL (ref 0.44–1.00)
GFR, Estimated: >60 mL/min (ref >60)
Glucose, Bld: 117 mg/dL — ABNORMAL HIGH (ref 70–99)
Potassium: 4 mmol/L (ref 3.5–5.1)
Sodium: 136 mmol/L (ref 135–145)
Total Bilirubin: 0.5 mg/dL (ref 0.0–1.2)
Total Protein: 6.2 g/dL — ABNORMAL LOW (ref 6.5–8.1)

## 2023-05-08 LAB — PROTIME-INR
INR: 2.2 — ABNORMAL HIGH (ref 0.8–1.2)
Prothrombin Time: 24.5 s — ABNORMAL HIGH (ref 11.4–15.2)

## 2023-05-08 MED ORDER — IBUPROFEN 400 MG PO TABS: 600.0000 mg | ORAL_TABLET | Freq: Once | ORAL | Status: DC

## 2023-05-08 MED ORDER — ACETAMINOPHEN 325 MG PO TABS
650.0000 mg | ORAL_TABLET | ORAL | Status: AC
  Administered 2023-05-08: 650 mg via ORAL
  Filled 2023-05-08: qty 2

## 2023-05-08 NOTE — Discharge Instructions (Signed)
 It was a pleasure taking part in your care.  As discussed, your work up is reassuring.  Please follow-up with your PCP.  Please return to the ED with any new or worsening symptoms.

## 2023-05-08 NOTE — ED Notes (Addendum)
 IV attempted twice by this nurse. Both attempts unsuccessful.

## 2023-05-08 NOTE — ED Triage Notes (Signed)
 Patient is coming from [place] from a fall from bed. Patient complaining of right foot pain and bilat knee pain. Previous right knee injury from one year ago. Patient is currently taking warfarin. Has not had any of her day time medications. EMS VS 160 SBP palpated 98% RA 89 HR

## 2023-05-08 NOTE — ED Provider Notes (Signed)
 [facility] Provider Note   CSN: [id number] Arrival date & time: 05/08/23  4098     History  Chief Complaint  Patient presents with   Beth Marshall is a 58 y.o. female with extensive medical history to include history of DVT, factor V Leiden mutation, chronic DVTs, osteoarthritis, morbid obesity.  Patient presents to ED for evaluation of fall.  States that she was at a rehab facility this morning being cleaned.  States that she was rolled over onto her right when she slipped out of the bed and fell to the floor.  She reports that the bed was probably 3 or 4 feet off of the ground.  She denies hitting her head or losing consciousness.  She does take warfarin for chronic DVTs.  She denies neck pain, back pain, lightheadedness, dizziness, weakness, chest pain or shortness of breath.  Denies nausea or vomiting.  Reports bilateral knee pain, right foot pain.  Denies medications prior to arrival.   Fall Pertinent negatives include no chest pain and no shortness of breath.       Home Medications Prior to Admission medications   Medication Sig Start Date End Date Taking? Authorizing Provider  acetaminophen  (TYLENOL ) 650 MG CR tablet Take 650 mg by mouth every 6 (six) hours as needed for pain.    [provider]  albuterol  (PROVENTIL  HFA;VENTOLIN  HFA) 108 (90 BASE) MCG/ACT inhaler Inhale 1 puff into the lungs every 6 (six) hours as needed for wheezing or shortness of breath.    [provider]  BELSOMRA 10 MG TABS Take 10 mg by mouth daily as needed (for insomnia). 06/01/21   [provider]  budesonide-formoterol  (SYMBICORT) 160-4.5 MCG/ACT inhaler Inhale 2 puffs into the lungs.    [provider]  calcipotriene  (DOVONOX) 0.005 % cream Apply 1 Application topically 2 (two) times daily. Apply to affected areas twice a day for psoriasis    [provider]  cetirizine (ZYRTEC) 10 MG tablet Take 10  mg by mouth. 01/16/19   [provider]  cyclobenzaprine  (FLEXERIL ) 5 MG tablet Take 5 mg by mouth 3 (three) times daily as needed for muscle spasms.    [provider]  diclofenac  Sodium (VOLTAREN ) 1 % GEL Apply 2 g topically every 8 (eight) hours as needed (for pain).    [provider]  furosemide  (LASIX ) 40 MG tablet Take 40 mg by mouth.    [provider]  montelukast  (SINGULAIR ) 10 MG tablet Take 10 mg by mouth daily at 12 noon. 10/24/18   [provider]  ondansetron  (ZOFRAN -ODT) 4 MG disintegrating tablet Take 1 tablet (4 mg total) by mouth every 8 (eight) hours as needed for nausea or vomiting. 04/06/23   Adel Aden, PA-C  oxyCODONE -acetaminophen  (PERCOCET/ROXICET) 5-325 MG tablet Take 1-2 tablets by mouth every 4 (four) hours as needed for severe pain (1 tablet moderate pain, 2 tablets severe pain). 06/22/21   Versie Gores, PA-C  oxyCODONE -acetaminophen  (PERCOCET/ROXICET) 5-325 MG tablet Take 1 tablet by mouth every 6 (six) hours as needed for severe pain. 02/05/22   Darlis Eisenmenger, PA-C  pantoprazole  (PROTONIX ) 40 MG tablet Take 1 tablet (40 mg total) by mouth daily. 06/27/21 12/26/21  [place], Rema Care, DO  polyethylene glycol (MIRALAX  / GLYCOLAX ) 17 g packet Take 17 g by mouth daily as needed for mild constipation. 06/26/21   [place], Eric J, DO  Potassium Chloride  10 MEQ PACK Take 10  mEq by mouth 2 (two) times daily.    [provider]  potassium chloride  SA (KLOR-CON  M) 20 MEQ tablet Take 1 tablet (20 mEq total) by mouth 2 (two) times daily. 02/23/23   Mozell Arias, MD  SUMAtriptan  (IMITREX ) 100 MG tablet Take 1 tablet by mouth every 2 (two) hours as needed for migraine. 07/18/15   [provider]  venlafaxine  XR (EFFEXOR -XR) 75 MG 24 hr capsule Take 75 mg by mouth daily with breakfast. 08/02/18   [provider]  Vitamin D , Ergocalciferol , (DRISDOL ) 1.25 MG (50000 UNIT) CAPS capsule Take 1 capsule (50,000 Units  total) by mouth every 7 (seven) days. Patient taking differently: Take 50,000 Units by mouth every 7 (seven) days. Every Saturday 06/22/21   Versie Gores, PA-C  warfarin (COUMADIN ) 1 MG tablet Take 1 mg by mouth See admin instructions. Take with 7.5mg  tablet for a total dose of 8.5mg  once every Sun, Mon, Wed, Fri, Sat    [provider]  warfarin (COUMADIN ) 4 MG tablet Take 8 mg by mouth 2 (two) times a week. Take 2 tablets by mouth once every Tuesday and Thursday    [provider]  warfarin (COUMADIN ) 7.5 MG tablet Take 7.5 mg by mouth See admin instructions. Take with 1mg  tablet for a total dose of 8.5mg  once every Sun, Mon, Wed, Fri, Sat    [provider]      Allergies    Cephalexin, Chocolate, Codeine, Diphenhydramine hcl, and Ranitidine    Review of Systems   Review of Systems  Respiratory:  Negative for shortness of breath.   Cardiovascular:  Negative for chest pain.  Musculoskeletal:  Positive for arthralgias and myalgias.  All other systems reviewed and are negative.   Physical Exam Updated Vital Signs BP 107/70   Pulse 85   Resp 15   SpO2 100%  Physical Exam Vitals and nursing note reviewed.  Constitutional:      General: She is not in acute distress.    Appearance: She is well-developed. She is obese.  HENT:     Head: Normocephalic and atraumatic.  Eyes:     Conjunctiva/sclera: Conjunctivae normal.  Neck:     Comments: No tenderness to cervical spine Cardiovascular:     Rate and Rhythm: Normal rate and regular rhythm.     Heart sounds: No murmur heard. Pulmonary:     Effort: Pulmonary effort is normal. No respiratory distress.     Breath sounds: Normal breath sounds.  Abdominal:     Palpations: Abdomen is soft.     Tenderness: There is no abdominal tenderness.  Musculoskeletal:        General: No swelling.     Cervical back: Neck supple.     Comments: Reduced range of motion of bilateral knees secondary to pain.  No obvious  deformity.  No overlying skin change.  No erythema, no warmth.  No deformity to patient right foot.  2+ DP pulse in her right foot.  Neurovascularly intact.  Skin:    General: Skin is warm and dry.     Capillary Refill: Capillary refill takes less than 2 seconds.  Neurological:     General: No focal deficit present.     Mental Status: She is alert and oriented to person, place, and time.     GCS: GCS eye subscore is 4. GCS verbal subscore is 5. GCS motor subscore is 6.     Cranial Nerves: Cranial nerves 2-12 are intact. No cranial nerve deficit.  Sensory: Sensation is intact. No sensory deficit.     Motor: Motor function is intact. No weakness.     Comments: CN II through XII intact.  Intact finger-nose, heel-to-shin.  No pronator drift, no slurred speech.  5 out of 5 strength bilateral upper extremities.  5 out of 5 strength bilateral lower extremities.  Pupils PERRL.  Psychiatric:        Mood and Affect: Mood normal.     ED Results / Procedures / Treatments   Labs (all labs ordered are listed, but only abnormal results are displayed) Labs Reviewed  CBC WITH DIFFERENTIAL/PLATELET - Abnormal; Notable for the following components:      Result Value   WBC 10.8 (*)    Hemoglobin 11.5 (*)    RDW 15.7 (*)    All other components within normal limits  COMPREHENSIVE METABOLIC PANEL WITH GFR - Abnormal; Notable for the following components:   Glucose, Bld 117 (*)    Total Protein 6.2 (*)    Albumin 3.1 (*)    AST 13 (*)    All other components within normal limits  PROTIME-INR - Abnormal; Notable for the following components:   Prothrombin Time 24.5 (*)    INR 2.2 (*)    All other components within normal limits    EKG None  Radiology CT Head Wo Contrast Result Date: 05/08/2023 CLINICAL DATA:  58 year old female status post fall from bed.  Pain. EXAM: CT HEAD WITHOUT CONTRAST TECHNIQUE: Contiguous axial images were obtained from the base of the skull through the vertex  without intravenous contrast. RADIATION DOSE REDUCTION: This exam was performed according to the departmental dose-optimization program which includes automated exposure control, adjustment of the mA and/or kV according to patient size and/or use of iterative reconstruction technique. COMPARISON:  Head CT 06/18/2021. FINDINGS: Brain: Cerebral volume remains normal for age. No midline shift, ventriculomegaly, mass effect, evidence of mass lesion, intracranial hemorrhage or evidence of cortically based acute infarction. Gray-white matter differentiation is within normal limits throughout the brain. Vascular: No suspicious intracranial vascular hyperdensity. Age advanced calcified atherosclerosis at the skull base. Skull: Intact.  No acute osseous abnormality identified. Sinuses/Orbits: Visualized paranasal sinuses and mastoids are clear. Other: Chronic forehead scalp soft tissue scarring. No acute orbit or scalp soft tissue injury identified. IMPRESSION: 1. No acute traumatic injury identified. 2. Age advanced calcified atherosclerosis at the skull base but otherwise normal noncontrast CT appearance of the brain. Electronically Signed   By: Marlise Simpers M.D.   On: 05/08/2023 08:41   DG Knee Complete 4 Views Right Result Date: 05/08/2023 CLINICAL DATA:  58 year old female status post fall from bed. Pain. EXAM: RIGHT KNEE - COMPLETE 4+ VIEW COMPARISON:  Right knee series 06/18/2021 [facility] FINDINGS: Four views.  Large body habitus. 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 Stable visualized osseous structures. Calcified peripheral vascular disease has progressed since 2023. IMPRESSION: 1. 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 No acute fracture or dislocation identified. 2. Progressed calcified peripheral vascular disease. Electronically Signed   By: Marlise Simpers M.D.   On: 05/08/2023 08:20   DG Knee  Complete 4 Views Left Result Date: 05/08/2023 CLINICAL DATA:  58 year old female status post fall from bed. Pain. EXAM: LEFT KNEE - COMPLETE 4+ VIEW COMPARISON:  Left tib fib series 11/12/2021. FINDINGS: Four views.  Large body habitus. Chronic tibia ORIF with intramedullary rod, 4 proximal interlocking cortical screws. Suboptimal healing of the proximal left tibia metadiaphysis  fracture since 2023, some residual fracture lucency visible along the medial cortex. But hardware appears intact with no definite loosening. Severe tricompartmental right knee joint degeneration, joint space loss, spurring. No acute osseous abnormality identified. IMPRESSION: 1. No acute fracture or dislocation identified about the left knee. 2. Chronic left tibia ORIF with suboptimal healing of the proximal tibia metadiaphysis fracture since 2023. 3. Severe tricompartmental left knee joint degeneration. Electronically Signed   By: Marlise Simpers M.D.   On: 05/08/2023 08:19   DG Foot Complete Right Result Date: 05/08/2023 CLINICAL DATA:  58 year old female status post fall from bed.  Pain. EXAM: RIGHT FOOT COMPLETE - 3+ VIEW COMPARISON:  Right foot series 06/18/2021. FINDINGS: Three views. Stable osteopenia. Calcaneus degenerative spurring partially obscured by external artifact now. Stable joint spaces, alignment, osseous structures. No acute osseous abnormality identified. Soft tissue swelling most pronounced at the medial ankle. IMPRESSION: Medial soft tissue swelling. No acute fracture or dislocation identified about the right foot. Electronically Signed   By: Marlise Simpers M.D.   On: 05/08/2023 08:17    Procedures Procedures   Medications Ordered in ED Medications  acetaminophen  (TYLENOL ) tablet 650 mg (650 mg Oral Given 05/08/23 1610)    ED Course/ Medical Decision Making/ A&P   Medical Decision Making  58 year old female presents for evaluation.  Please see HPI for further details.  On exam patient is afebrile and  nontachycardic.  Her lung sounds are clear bilaterally, she is not hypoxic.  Abdomen soft and compressible.  Neurological examinations are baseline.  Reduced range of motion of bilateral knees secondary to pain.  No obvious deformity patient right foot.  She reports DP pulse in the right foot.  Neurovascular intact.  Patient CBC with a slight leukocytosis 10.8, baseline hemoglobin.  PT/INR appropriately elevated in the setting of warfarin use.  CMP without electrolyte derangement, anion gap 6, no elevated LFTs.  X-ray imaging of patient bilateral knees shows osteoarthritis but no acute fracture.  X-ray imaging of patient right foot unremarkable.  Patient CT head unremarkable.  This was collected due to the patient falling and taking a blood thinner.  At this time, patient workup unremarkable.  Patient we discharged him at this time.  She is amenable plan.  She was advised to follow-up with her PCP.  Stable.  Final Clinical Impression(s) / ED Diagnoses Final diagnoses:  Fall, initial encounter  Chronic pain of both knees    Rx / DC Orders ED Discharge Orders     None         Kristin Peyer 05/08/23 [id number]    Roberts Ching, MD 05/17/23 1739

## 2023-05-08 NOTE — ED Notes (Signed)
 [facility]AR has been arranged for the patient to travel to [facility].  No ETA could be provided at the time.

## 2023-10-14 IMAGING — CT CT ABD-PELV W/ CM
2 of 5 series · 15 of 46 positions shown, 17 images · IV contrast (APPLIED)
Comparison: CT 06/23/2014

CLINICAL DATA: Left lower quadrant pain radiating to the left leg.

EXAM:
CT ABDOMEN AND PELVIS WITH CONTRAST
TECHNIQUE: Multidetector CT imaging of the abdomen and pelvis was performed
using the standard protocol following bolus administration of
intravenous contrast.

[Series 3: abd/ pelvis 5.0 i30f 2 · axial · 0.98mm/px · z∈[+1003,+1483]mm · 12 of 108 slices shown, 14 images]
[im 6/108  soft-tissue]
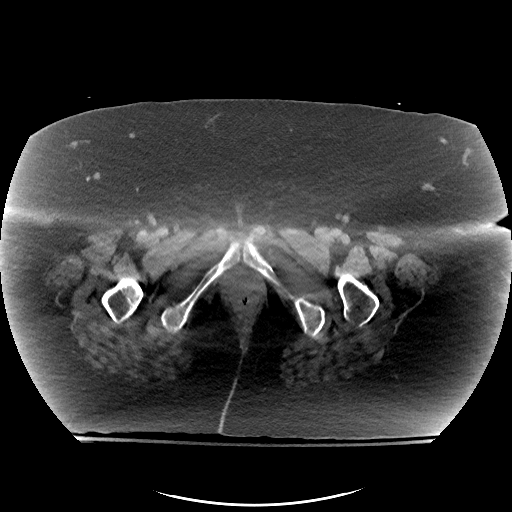
[im 6/108  bone]
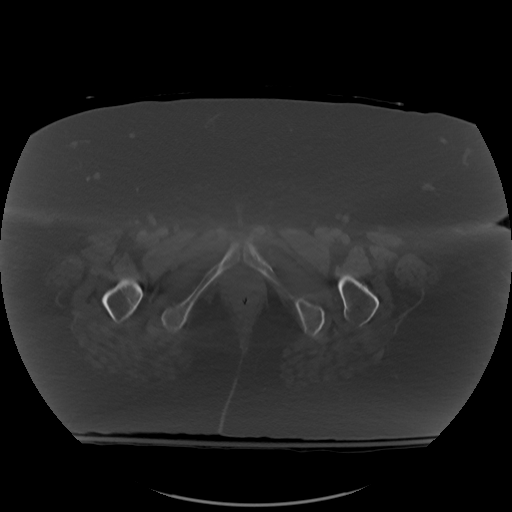
[im 17/108  soft-tissue]
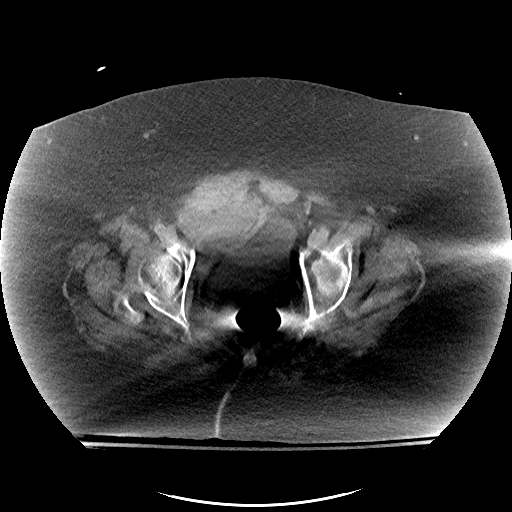
[im 23/108  soft-tissue]
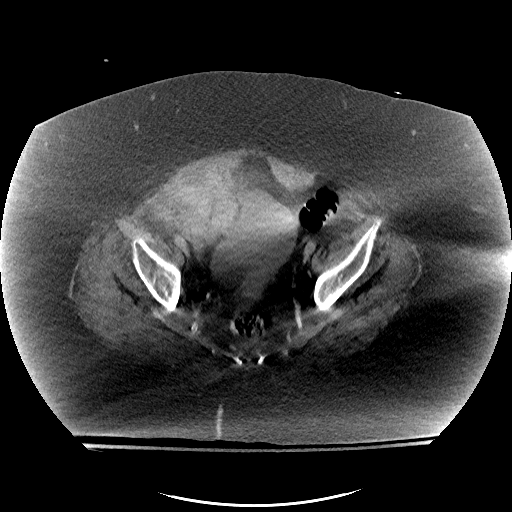
[im 34/108  soft-tissue]
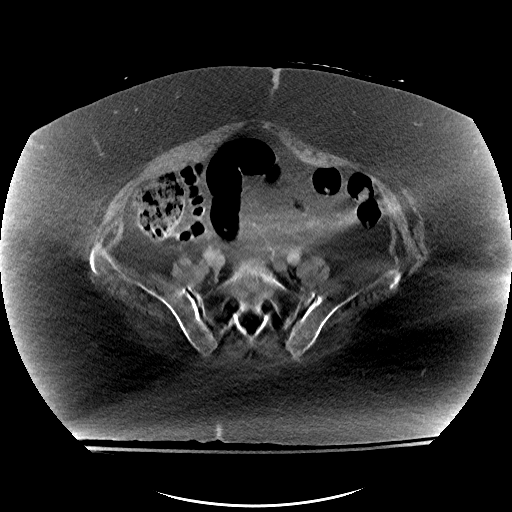
[im 40/108  soft-tissue]
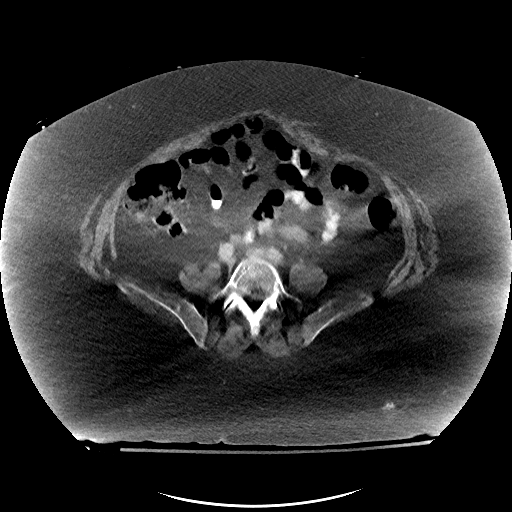
[im 51/108  soft-tissue]
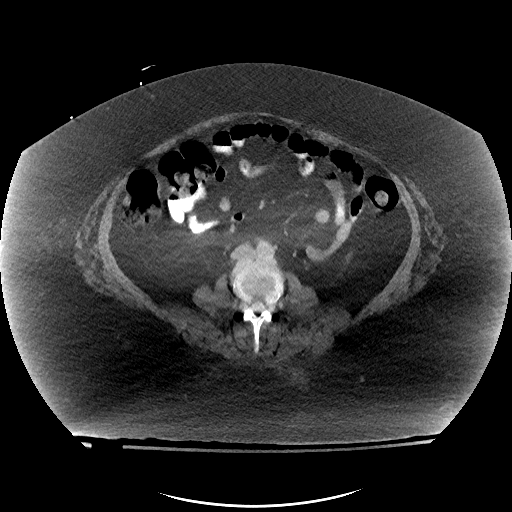
[im 57/108  soft-tissue]
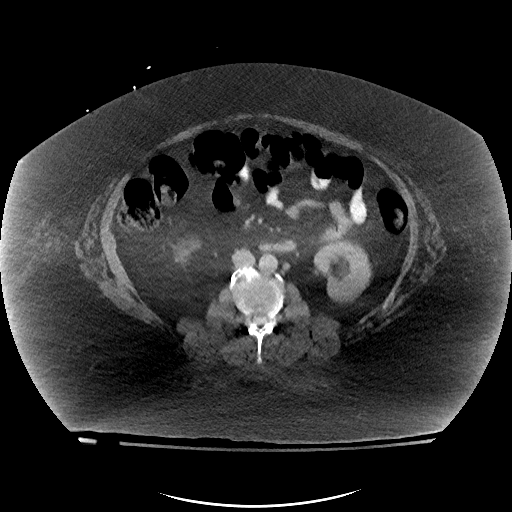
[im 68/108  soft-tissue]
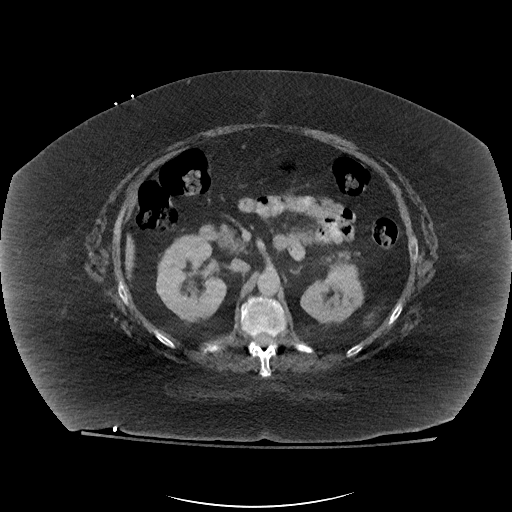
[im 74/108  soft-tissue]
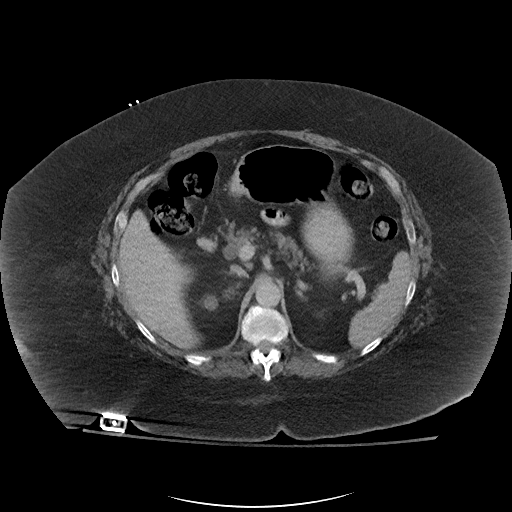
[im 74/108  bone]
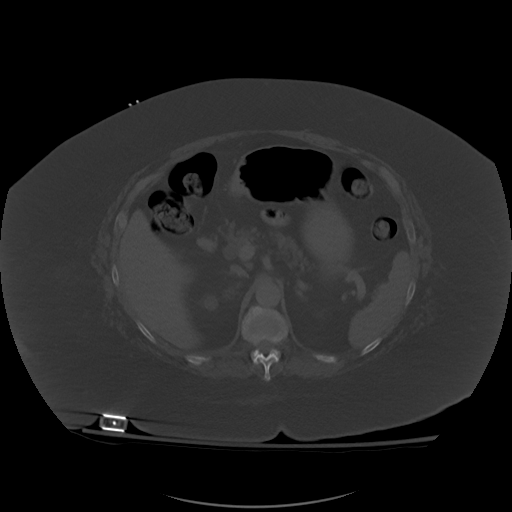
[im 85/108  soft-tissue]
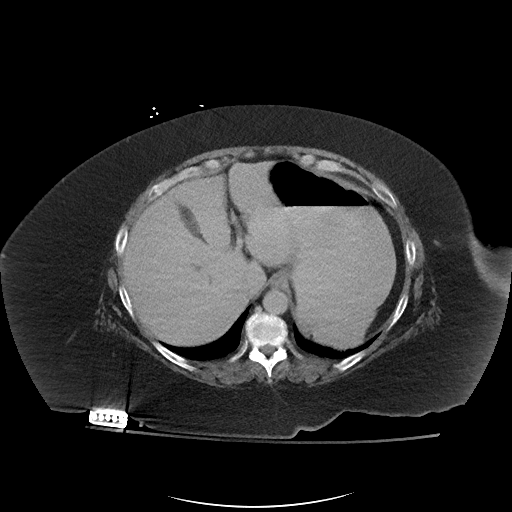
[im 91/108  soft-tissue]
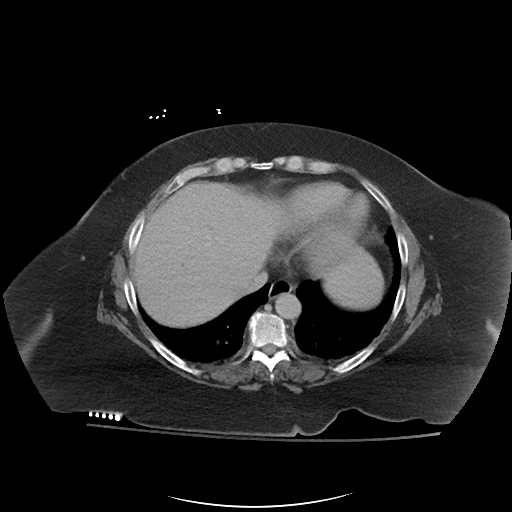
[im 102/108  soft-tissue]
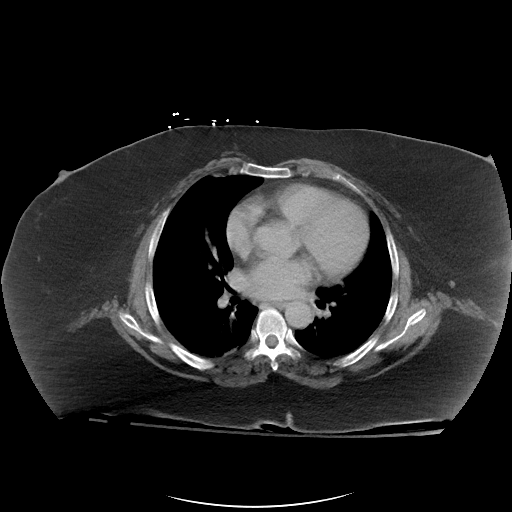

[Series 6: coronal soft tissue · coronal · 0.99mm/px · 3 of 119 slices shown]
[im 40/119  soft-tissue]
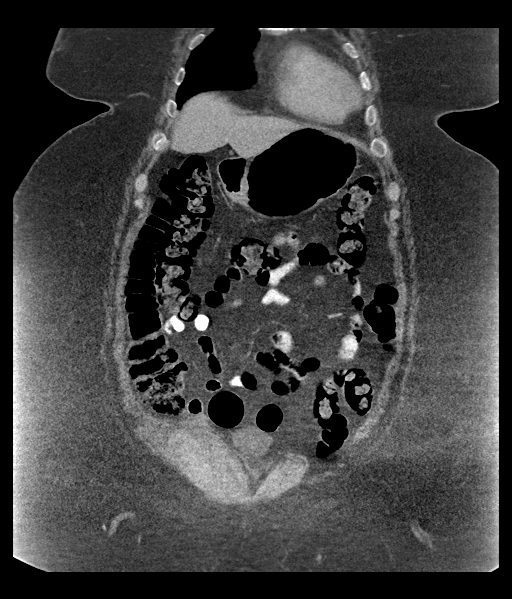
[im 53/119  soft-tissue]
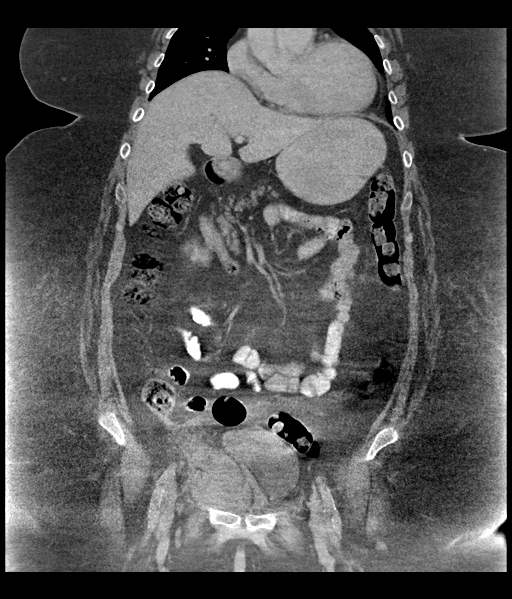
[im 66/119  soft-tissue]
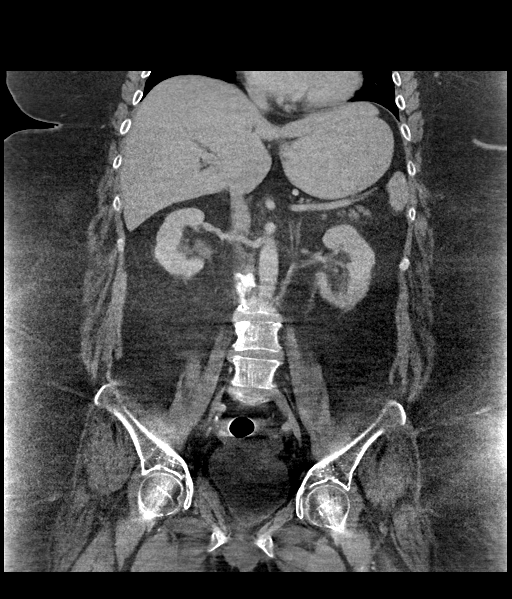

[15 of 46 positions shown; findings below may reference images not displayed]

RADIATION DOSE REDUCTION: This exam was performed according to the
departmental dose-optimization program which includes automated
exposure control, adjustment of the mA and/or kV according to
patient size and/or use of iterative reconstruction technique.

CONTRAST:  100mL OMNIPAQUE IOHEXOL 300 MG/ML  SOLN
FINDINGS: Lower chest: No acute airspace disease or pleural effusion.

Hepatobiliary: Cholecystectomy. There is mild intrahepatic biliary
ductal dilatation. Common bile duct measures 9 mm, normal for post
cholecystectomy status. There is no focal hepatic lesion.

Pancreas: Mild fatty atrophy.  No ductal dilatation or inflammation.

Spleen: Normal in size without focal abnormality.

Adrenals/Urinary Tract: 2 cm right adrenal nodule is unchanged in
size from 4382 exam, favoring benign adenoma. No left adrenal
nodule. No hydronephrosis or perinephric edema. Homogeneous renal
enhancement with symmetric excretion on delayed phase imaging. 11 mm
hypodensity in the upper pole of the right kidney is unchanged from
prior exam, consistent with cyst. This needs no further follow-up.
No suspicious renal lesion. Punctate nonobstructing stone in the
lower pole of the left kidney. Urinary bladder is physiologically
distended without wall thickening.

Stomach/Bowel: The stomach is distended with ingested contrast. No
gastric wall thickening. No small bowel obstruction, ileus, or
inflammation. The appendix is normal. Moderate volume of stool
throughout the colon with colonic tortuosity. No colonic
inflammation.

Vascular/Lymphatic: Normal caliber abdominal aorta. Patent portal
vein. Retroaortic left renal vein. No abdominopelvic adenopathy.

Reproductive: Hysterectomy.  No adnexal mass

Other: There is a right rectus sheath hematoma involving the lower
abdominal wall that measures 7.2 cm in transverse dimension. This
spans approximately 9.9 cm cranial caudal. There is mild adjacent
soft tissue stranding. Trace extension into the extraperitoneal
pelvis on the right. There is no evidence 2 of active extravasation.
No free air. Tiny fat containing umbilical hernia.

Musculoskeletal: Technically limited assessment of the lumbar spine
or pelvis due to soft tissue attenuation from habitus. Allowing for
this, no acute findings.
IMPRESSION: 1. Moderate sized right rectus sheath hematoma. Trace extension into
the extraperitoneal pelvis on the right. No evidence of active
extravasation.
2. Punctate nonobstructing left renal stone.
3. Stable right adrenal nodule dating back to 4382 exam, favoring
benign adenoma.

## 2023-10-19 ENCOUNTER — Emergency Department (HOSPITAL_COMMUNITY)

## 2023-10-19 ENCOUNTER — Emergency Department (HOSPITAL_COMMUNITY)
Admission: EM | Admit: 2023-10-19 | Discharge: 2023-10-20 | Disposition: A | Discharge status: Home / Self Care | Attending: Emergency Medicine | Admitting: Emergency Medicine

## 2023-10-19 ENCOUNTER — Other Ambulatory Visit: Payer: Self-pay

## 2023-10-19 ENCOUNTER — Encounter (HOSPITAL_COMMUNITY): Payer: Self-pay

## 2023-10-19 DIAGNOSIS — I482 Chronic atrial fibrillation, unspecified: Secondary | ICD-10-CM | POA: Diagnosis not present

## 2023-10-19 DIAGNOSIS — Z7901 Long term (current) use of anticoagulants: Secondary | ICD-10-CM | POA: Insufficient documentation

## 2023-10-19 DIAGNOSIS — R1013 Epigastric pain: Secondary | ICD-10-CM | POA: Insufficient documentation

## 2023-10-19 LAB — COMPREHENSIVE METABOLIC PANEL WITH GFR
ALT: 12 U/L (ref 0–44)
AST: 14 U/L — ABNORMAL LOW (ref 15–41)
Albumin: 3 g/dL — ABNORMAL LOW (ref 3.5–5.0)
Alkaline Phosphatase: 60 U/L (ref 38–126)
Anion gap: 8 (ref 5–15)
BUN: 14 mg/dL (ref 6–20)
CO2: 25 mmol/L (ref 22–32)
Calcium: 9 mg/dL (ref 8.9–10.3)
Chloride: 102 mmol/L (ref 98–111)
Creatinine, Ser: 0.7 mg/dL (ref 0.44–1.00)
GFR, Estimated: >60 mL/min (ref >60)
Glucose, Bld: 109 mg/dL — ABNORMAL HIGH (ref 70–99)
Potassium: 4.1 mmol/L (ref 3.5–5.1)
Sodium: 135 mmol/L (ref 135–145)
Total Bilirubin: 0.6 mg/dL (ref 0.0–1.2)
Total Protein: 6.7 g/dL (ref 6.5–8.1)

## 2023-10-19 LAB — TROPONIN I (HIGH SENSITIVITY): Troponin I (High Sensitivity): 3 ng/L (ref <18)

## 2023-10-19 LAB — CBC WITH DIFFERENTIAL/PLATELET
Abs Immature Granulocytes: 0.02 K/uL (ref 0.00–0.07)
Basophils Absolute: 0.1 K/uL (ref 0.0–0.1)
Basophils Relative: 1 %
Eosinophils Absolute: 0.2 K/uL (ref 0.0–0.5)
Eosinophils Relative: 2 %
HCT: 38.4 % (ref 36.0–46.0)
Hemoglobin: 11.7 g/dL — ABNORMAL LOW (ref 12.0–15.0)
Immature Granulocytes: 0 %
Lymphocytes Relative: 32 %
Lymphs Abs: 2.8 K/uL (ref 0.7–4.0)
MCH: 25.7 pg — ABNORMAL LOW (ref 26.0–34.0)
MCHC: 30.5 g/dL (ref 30.0–36.0)
MCV: 84.4 fL (ref 80.0–100.0)
Monocytes Absolute: 0.6 K/uL (ref 0.1–1.0)
Monocytes Relative: 7 %
Neutro Abs: 5.2 K/uL (ref 1.7–7.7)
Neutrophils Relative %: 58 %
Platelets: 266 K/uL (ref 150–400)
RBC: 4.55 MIL/uL (ref 3.87–5.11)
RDW: 18.6 % — ABNORMAL HIGH (ref 11.5–15.5)
WBC: 8.8 K/uL (ref 4.0–10.5)
nRBC: 0 % (ref 0.0–0.2)

## 2023-10-19 LAB — MAGNESIUM: Magnesium: 2.1 mg/dL (ref 1.7–2.4)

## 2023-10-19 MED ORDER — HYDROMORPHONE HCL 1 MG/ML IJ SOLN: 0.5000 mg | INTRAMUSCULAR | Status: DC | PRN

## 2023-10-19 NOTE — ED Notes (Signed)
Dr Dixon at bedside.

## 2023-10-19 NOTE — ED Triage Notes (Signed)
 PER EMS: pt arrives from [facility] with c/o sharp abd pain x 3 weeks but worsened tonight and worse after eating. +diarrhea, denies nausea/vomiting. She also reports left arm pain today while at rehab during exercises.  BP- 160/palp, HR-80, O2-98% RA

## 2023-10-19 NOTE — ED Provider Notes (Signed)
 [facility] EMERGENCY DEPARTMENT AT [facility] Provider Note   CSN: [id number] Arrival date & time: 10/19/23  2150     Patient presents with: No chief complaint on file.   Beth Marshall is a 58 y.o. female.  {Add pertinent medical, surgical, social history, OB history to [id number]:67052} HPI Patient presents for abdominal pain.  Medical history includes DVT, cholecystectomy, GERD, OSA, migraines, depression, arthritis, atrial fibrillation, obesity.  She resides in a nursing facility due to poor mobility.  She has been working with PT but is unable to walk currently.  She has been on Ozempic for the past 2 months in an effort to lose weight.  Over that time, she has had intermittent epigastric pain.  This seems to worsen after eating.  Tonight, she had similar pain but worsened in severity.  Onset was around 7:30 PM.  Initially, it was 10/10 in severity.  Currently it is 8/10.  She did have radiation of pain to her back which has now resolved.    Prior to Admission medications   Medication Sig Start Date End Date Taking? Authorizing Provider  acetaminophen  (TYLENOL ) 650 MG CR tablet Take 650 mg by mouth every 6 (six) hours as needed for pain.    [provider]  albuterol  (PROVENTIL  HFA;VENTOLIN  HFA) 108 (90 BASE) MCG/ACT inhaler Inhale 1 puff into the lungs every 6 (six) hours as needed for wheezing or shortness of breath.    [provider]  BELSOMRA 10 MG TABS Take 10 mg by mouth daily as needed (for insomnia). 06/01/21   [provider]  budesonide-formoterol  (SYMBICORT) 160-4.5 MCG/ACT inhaler Inhale 2 puffs into the lungs.    [provider]  calcipotriene  (DOVONOX) 0.005 % cream Apply 1 Application topically 2 (two) times daily. Apply to affected areas twice a day for psoriasis    [provider]  cetirizine (ZYRTEC) 10 MG tablet Take 10 mg by mouth. 01/16/19   [provider]  cyclobenzaprine  (FLEXERIL ) 5 MG tablet Take 5 mg  by mouth 3 (three) times daily as needed for muscle spasms.    [provider]  diclofenac  Sodium (VOLTAREN ) 1 % GEL Apply 2 g topically every 8 (eight) hours as needed (for pain).    [provider]  furosemide  (LASIX ) 40 MG tablet Take 40 mg by mouth.    [provider]  montelukast  (SINGULAIR ) 10 MG tablet Take 10 mg by mouth daily at 12 noon. 10/24/18   [provider]  ondansetron  (ZOFRAN -ODT) 4 MG disintegrating tablet Take 1 tablet (4 mg total) by mouth every 8 (eight) hours as needed for nausea or vomiting. 04/06/23   Beth Lonni FALCON, PA-C  oxyCODONE -acetaminophen  (PERCOCET/ROXICET) 5-325 MG tablet Take 1-2 tablets by mouth every 4 (four) hours as needed for severe pain (1 tablet moderate pain, 2 tablets severe pain). 06/22/21   Beth Marshall LABOR, PA-C  oxyCODONE -acetaminophen  (PERCOCET/ROXICET) 5-325 MG tablet Take 1 tablet by mouth every 6 (six) hours as needed for severe pain. 02/05/22   Beverley Leita LABOR, PA-C  pantoprazole  (PROTONIX ) 40 MG tablet Take 1 tablet (40 mg total) by mouth daily. 06/27/21 12/26/21  [place], Camellia PARAS, DO  polyethylene glycol (MIRALAX  / GLYCOLAX ) 17 g packet Take 17 g by mouth daily as needed for mild constipation. 06/26/21   [place], Camellia PARAS, DO  Potassium Chloride  10 MEQ PACK Take 10 mEq by mouth 2 (two) times daily.    [provider]  potassium chloride  SA (KLOR-CON  M) 20 MEQ tablet  Take 1 tablet (20 mEq total) by mouth 2 (two) times daily. 02/23/23   Beth Lot, MD  SUMAtriptan  (IMITREX ) 100 MG tablet Take 1 tablet by mouth every 2 (two) hours as needed for migraine. 07/18/15   [provider]  venlafaxine  XR (EFFEXOR -XR) 75 MG 24 hr capsule Take 75 mg by mouth daily with breakfast. 08/02/18   [provider]  Vitamin D , Ergocalciferol , (DRISDOL ) 1.25 MG (50000 UNIT) CAPS capsule Take 1 capsule (50,000 Units total) by mouth every 7 (seven) days. Patient taking differently: Take 50,000 Units by mouth every  7 (seven) days. Every Saturday 06/22/21   Beth Marshall LABOR, PA-C  warfarin (COUMADIN ) 1 MG tablet Take 1 mg by mouth See admin instructions. Take with 7.5mg  tablet for a total dose of 8.5mg  once every Sun, Mon, Wed, Fri, Sat    [provider]  warfarin (COUMADIN ) 4 MG tablet Take 8 mg by mouth 2 (two) times a week. Take 2 tablets by mouth once every Tuesday and Thursday    [provider]  warfarin (COUMADIN ) 7.5 MG tablet Take 7.5 mg by mouth See admin instructions. Take with 1mg  tablet for a total dose of 8.5mg  once every Sun, Mon, Wed, Fri, Sat    [provider]    Allergies: Cephalexin, Chocolate, Codeine, Diphenhydramine hcl, and Ranitidine    Review of Systems  Gastrointestinal:  Positive for abdominal pain.  Musculoskeletal:  Positive for back pain.  All other systems reviewed and are negative.   Updated Vital Signs There were no vitals taken for this visit.  Physical Exam Vitals and nursing note reviewed.  Constitutional:      General: She is not in acute distress.    Appearance: Normal appearance. She is well-developed. She is not ill-appearing, toxic-appearing or diaphoretic.  HENT:     Head: Normocephalic and atraumatic.     Right Ear: External ear normal.     Left Ear: External ear normal.     Nose: Nose normal.     Mouth/Throat:     Mouth: Mucous membranes are moist.  Eyes:     Extraocular Movements: Extraocular movements intact.     Conjunctiva/sclera: Conjunctivae normal.  Cardiovascular:     Rate and Rhythm: Normal rate and regular rhythm.  Pulmonary:     Effort: Pulmonary effort is normal. No respiratory distress.  Abdominal:     General: There is no distension.     Palpations: Abdomen is soft.     Tenderness: There is abdominal tenderness. There is no guarding or rebound.  Musculoskeletal:        General: No swelling. Normal range of motion.     Cervical back: Normal range of motion and neck supple.  Skin:    General: Skin is  warm and dry.     Coloration: Skin is not jaundiced or pale.  Neurological:     General: No focal deficit present.     Mental Status: She is alert and oriented to person, place, and time.  Psychiatric:        Mood and Affect: Mood normal.        Behavior: Behavior normal.     (all labs ordered are listed, but only abnormal results are displayed) Labs Reviewed - No data to display  EKG: None  Radiology: No results found.  {Document cardiac monitor, telemetry assessment procedure when appropriate:32947} Procedures   Medications Ordered in the ED - No data to display    {Click here for ABCD2, HEART and  other calculators REFRESH Note before signing:1}                              Medical Decision Making  This patient presents to the ED for concern of ***, this involves an extensive number of treatment options, and is a complaint that carries with it a high risk of complications and morbidity.  The differential diagnosis includes ***   Co morbidities / Chronic conditions that complicate the patient evaluation  ***   Additional history obtained:  Additional history obtained from EMR External records from outside source obtained and reviewed including ***   Lab Tests:  I Ordered, and personally interpreted labs.  The pertinent results include:  ***   Imaging Studies ordered:  I ordered imaging studies including ***  I independently visualized and interpreted imaging which showed *** I agree with the radiologist interpretation   Cardiac Monitoring: / EKG:  The patient was maintained on a cardiac monitor.  I personally viewed and interpreted the cardiac monitored which showed an underlying rhythm of: ***   Problem List / ED Course / Critical interventions / Medication management  Patient presenting for epigastric pain, intermittent over the past several days but worsened in intensity tonight.  This was after she ate dinner.  On arrival in the ED, she is overall  well-appearing.  She rates her current pain 8/10 in severity but declines any pain medication at this time.  As needed Dilaudid  was ordered.  Workup was initiated.*** I ordered medication including ***   Reevaluation of the patient after these medicines showed that the patient *** I have reviewed the patients home medicines and have made adjustments as needed   Consultations Obtained:  I requested consultation with the ***,  and discussed lab and imaging findings as well as pertinent plan - they recommend: ***   Social Determinants of Health:  ***   Test / Admission - Considered:  ***   {Document critical care time when appropriate  Document review of labs and clinical decision tools ie CHADS2VASC2, etc  Document your independent review of radiology images and any outside records  Document your discussion with family members, caretakers and with consultants  Document social determinants of health affecting pt's care  Document your decision making why or why not admission, treatments were needed:32947:::1}   Final diagnoses:  None    ED Discharge Orders     None

## 2023-10-20 ENCOUNTER — Encounter (HOSPITAL_COMMUNITY): Payer: Self-pay | Admitting: Emergency Medicine

## 2023-10-20 ENCOUNTER — Emergency Department (HOSPITAL_COMMUNITY)

## 2023-10-20 DIAGNOSIS — R1013 Epigastric pain: Secondary | ICD-10-CM | POA: Diagnosis not present

## 2023-10-20 LAB — URINALYSIS, ROUTINE W REFLEX MICROSCOPIC
Bacteria, UA: NONE SEEN
Bilirubin Urine: NEGATIVE
Glucose, UA: NEGATIVE mg/dL
Ketones, ur: NEGATIVE mg/dL
Leukocytes,Ua: NEGATIVE
Nitrite: NEGATIVE
Protein, ur: NEGATIVE mg/dL
Specific Gravity, Urine: 1.03 (ref 1.005–1.030)
pH: 5 (ref 5.0–8.0)

## 2023-10-20 LAB — PROTIME-INR
INR: 1.2 (ref 0.8–1.2)
Prothrombin Time: 15.7 s — ABNORMAL HIGH (ref 11.4–15.2)

## 2023-10-20 LAB — TROPONIN I (HIGH SENSITIVITY): Troponin I (High Sensitivity): 3 ng/L (ref <18)

## 2023-10-20 LAB — LIPASE, BLOOD: Lipase: 17 U/L (ref 11–51)

## 2023-10-20 MED ORDER — IOHEXOL 350 MG/ML SOLN
120.0000 mL | Freq: Once | INTRAVENOUS | Status: AC | PRN
  Administered 2023-10-20: 120 mL via INTRAVENOUS

## 2023-10-20 MED ORDER — LIDOCAINE VISCOUS HCL 2 % MT SOLN
15.0000 mL | Freq: Once | OROMUCOSAL | Status: AC
  Administered 2023-10-20: 15 mL via ORAL
  Filled 2023-10-20: qty 15

## 2023-10-20 MED ORDER — ALUM & MAG HYDROXIDE-SIMETH 200-200-20 MG/5ML PO SUSP
30.0000 mL | Freq: Once | ORAL | Status: AC
  Administered 2023-10-20: 30 mL via ORAL
  Filled 2023-10-20: qty 30

## 2023-10-20 NOTE — ED Notes (Signed)
 Pt has been informed a urine sample is needed. She states she does not have to urinate at this time but would inform staff when she has to urinate.

## 2023-10-20 NOTE — ED Notes (Signed)
 Patient transported to CT

## 2024-04-09 ENCOUNTER — Ambulatory Visit: Admitting: Neurology
